# Patient Record
Sex: Male | Born: 1935 | Race: White | Hispanic: No | Marital: Married | Smoking: Former smoker
Health system: Southern US, Community
[De-identification: ages and names within clinical notes are randomized; demographics above are authoritative.]

## PROBLEM LIST (undated history)

## (undated) DIAGNOSIS — I2510 Atherosclerotic heart disease of native coronary artery without angina pectoris: Secondary | ICD-10-CM

## (undated) DIAGNOSIS — K219 Gastro-esophageal reflux disease without esophagitis: Secondary | ICD-10-CM

## (undated) DIAGNOSIS — I10 Essential (primary) hypertension: Secondary | ICD-10-CM

## (undated) DIAGNOSIS — F419 Anxiety disorder, unspecified: Secondary | ICD-10-CM

## (undated) DIAGNOSIS — I779 Disorder of arteries and arterioles, unspecified: Secondary | ICD-10-CM

## (undated) DIAGNOSIS — M1990 Unspecified osteoarthritis, unspecified site: Secondary | ICD-10-CM

## (undated) DIAGNOSIS — R0602 Shortness of breath: Secondary | ICD-10-CM

## (undated) DIAGNOSIS — E119 Type 2 diabetes mellitus without complications: Secondary | ICD-10-CM

## (undated) DIAGNOSIS — I739 Peripheral vascular disease, unspecified: Secondary | ICD-10-CM

## (undated) DIAGNOSIS — J439 Emphysema, unspecified: Secondary | ICD-10-CM

## (undated) DIAGNOSIS — E785 Hyperlipidemia, unspecified: Secondary | ICD-10-CM

## (undated) HISTORY — PX: CARDIAC CATHETERIZATION: SHX172

## (undated) HISTORY — DX: Unspecified osteoarthritis, unspecified site: M19.90

## (undated) HISTORY — DX: Hyperlipidemia, unspecified: E78.5

## (undated) HISTORY — DX: Essential (primary) hypertension: I10

## (undated) HISTORY — DX: Gastro-esophageal reflux disease without esophagitis: K21.9

## (undated) HISTORY — PX: CORONARY ANGIOPLASTY: SHX604

## (undated) HISTORY — DX: Peripheral vascular disease, unspecified: I73.9

## (undated) HISTORY — DX: Atherosclerotic heart disease of native coronary artery without angina pectoris: I25.10

## (undated) HISTORY — PX: COLONOSCOPY: SHX174

## (undated) HISTORY — DX: Disorder of arteries and arterioles, unspecified: I77.9

## (undated) HISTORY — DX: Type 2 diabetes mellitus without complications: E11.9

---

## 1997-09-18 ENCOUNTER — Emergency Department (HOSPITAL_COMMUNITY): Admission: EM | Admit: 1997-09-18 | Discharge: 1997-09-18 | Payer: Self-pay | Admitting: Emergency Medicine

## 1999-03-05 ENCOUNTER — Ambulatory Visit (HOSPITAL_COMMUNITY): Admission: RE | Admit: 1999-03-05 | Discharge: 1999-03-05 | Payer: Self-pay | Admitting: Urology

## 1999-08-14 ENCOUNTER — Ambulatory Visit (HOSPITAL_COMMUNITY): Admission: RE | Admit: 1999-08-14 | Discharge: 1999-08-14 | Payer: Self-pay | Admitting: Urology

## 1999-08-14 ENCOUNTER — Encounter: Payer: Self-pay | Admitting: Urology

## 1999-10-16 ENCOUNTER — Ambulatory Visit (HOSPITAL_COMMUNITY): Admission: RE | Admit: 1999-10-16 | Discharge: 1999-10-17 | Payer: Self-pay | Admitting: Cardiovascular Disease

## 1999-10-16 ENCOUNTER — Encounter: Payer: Self-pay | Admitting: Cardiovascular Disease

## 1999-11-21 ENCOUNTER — Ambulatory Visit (HOSPITAL_COMMUNITY): Admission: RE | Admit: 1999-11-21 | Discharge: 1999-11-22 | Payer: Self-pay | Admitting: Cardiovascular Disease

## 1999-11-21 ENCOUNTER — Encounter: Payer: Self-pay | Admitting: Cardiovascular Disease

## 2000-02-25 ENCOUNTER — Ambulatory Visit (HOSPITAL_COMMUNITY): Admission: RE | Admit: 2000-02-25 | Discharge: 2000-02-25 | Payer: Self-pay | Admitting: Family Medicine

## 2001-11-15 ENCOUNTER — Encounter: Payer: Self-pay | Admitting: Cardiovascular Disease

## 2001-11-15 ENCOUNTER — Ambulatory Visit (HOSPITAL_COMMUNITY): Admission: RE | Admit: 2001-11-15 | Discharge: 2001-11-15 | Payer: Self-pay | Admitting: Cardiovascular Disease

## 2002-01-25 ENCOUNTER — Ambulatory Visit (HOSPITAL_COMMUNITY): Admission: RE | Admit: 2002-01-25 | Discharge: 2002-01-25 | Payer: Self-pay | Admitting: Gastroenterology

## 2003-08-06 ENCOUNTER — Ambulatory Visit (HOSPITAL_COMMUNITY): Admission: RE | Admit: 2003-08-06 | Discharge: 2003-08-06 | Payer: Self-pay | Admitting: Family Medicine

## 2003-08-28 ENCOUNTER — Encounter: Admission: RE | Admit: 2003-08-28 | Discharge: 2003-09-20 | Payer: Self-pay | Admitting: Neurosurgery

## 2004-01-18 ENCOUNTER — Ambulatory Visit (HOSPITAL_COMMUNITY): Admission: RE | Admit: 2004-01-18 | Discharge: 2004-01-18 | Payer: Self-pay | Admitting: Gastroenterology

## 2004-01-18 ENCOUNTER — Encounter (INDEPENDENT_AMBULATORY_CARE_PROVIDER_SITE_OTHER): Payer: Self-pay | Admitting: *Deleted

## 2004-12-27 ENCOUNTER — Emergency Department (HOSPITAL_COMMUNITY): Admission: EM | Admit: 2004-12-27 | Discharge: 2004-12-27 | Payer: Self-pay | Admitting: Emergency Medicine

## 2005-09-29 ENCOUNTER — Ambulatory Visit (HOSPITAL_COMMUNITY): Admission: RE | Admit: 2005-09-29 | Discharge: 2005-09-29 | Payer: Self-pay | Admitting: Podiatry

## 2007-05-04 ENCOUNTER — Encounter: Admission: RE | Admit: 2007-05-04 | Discharge: 2007-05-04 | Payer: Self-pay | Admitting: Family Medicine

## 2007-06-06 ENCOUNTER — Emergency Department (HOSPITAL_COMMUNITY): Admission: EM | Admit: 2007-06-06 | Discharge: 2007-06-06 | Payer: Self-pay | Admitting: Emergency Medicine

## 2008-12-17 ENCOUNTER — Inpatient Hospital Stay (HOSPITAL_COMMUNITY): Admission: EM | Admit: 2008-12-17 | Discharge: 2008-12-19 | Payer: Self-pay | Admitting: Emergency Medicine

## 2009-01-16 ENCOUNTER — Encounter: Admission: RE | Admit: 2009-01-16 | Discharge: 2009-01-16 | Payer: Self-pay | Admitting: Gastroenterology

## 2009-07-01 ENCOUNTER — Encounter: Admission: RE | Admit: 2009-07-01 | Discharge: 2009-07-01 | Payer: Self-pay | Admitting: Family Medicine

## 2009-07-20 ENCOUNTER — Encounter: Admission: RE | Admit: 2009-07-20 | Discharge: 2009-07-20 | Payer: Self-pay | Admitting: Neurology

## 2009-07-23 ENCOUNTER — Encounter: Admission: RE | Admit: 2009-07-23 | Discharge: 2009-10-21 | Payer: Self-pay | Admitting: Neurology

## 2010-08-23 LAB — DIFFERENTIAL
Basophils Absolute: 0.1 10*3/uL (ref 0.0–0.1)
Lymphocytes Relative: 36 % (ref 12–46)
Lymphs Abs: 2.1 10*3/uL (ref 0.7–4.0)
Monocytes Absolute: 0.7 10*3/uL (ref 0.1–1.0)

## 2010-08-23 LAB — LIPID PANEL
Cholesterol: 131 mg/dL (ref 0–200)
HDL: 40 mg/dL (ref >39)
LDL Cholesterol: 68 mg/dL (ref 0–99)
Total CHOL/HDL Ratio: 3.3 RATIO

## 2010-08-23 LAB — COMPREHENSIVE METABOLIC PANEL
ALT: 19 U/L (ref 0–53)
AST: 21 U/L (ref 0–37)
Albumin: 3.7 g/dL (ref 3.5–5.2)
Alkaline Phosphatase: 76 U/L (ref 39–117)
CO2: 24 mEq/L (ref 19–32)
GFR calc Af Amer: >60 mL/min (ref >60)
Glucose, Bld: 127 mg/dL — ABNORMAL HIGH (ref 70–99)
Total Bilirubin: 0.9 mg/dL (ref 0.3–1.2)
Total Protein: 6.2 g/dL (ref 6.0–8.3)

## 2010-08-23 LAB — BASIC METABOLIC PANEL
CO2: 25 mEq/L (ref 19–32)
Calcium: 9.2 mg/dL (ref 8.4–10.5)
Chloride: 111 mEq/L (ref 96–112)
GFR calc Af Amer: >60 mL/min (ref >60)
GFR calc non Af Amer: 51 mL/min — ABNORMAL LOW (ref >60)
GFR calc non Af Amer: 55 mL/min — ABNORMAL LOW (ref >60)
Glucose, Bld: 146 mg/dL — ABNORMAL HIGH (ref 70–99)
Potassium: 4.6 mEq/L (ref 3.5–5.1)
Sodium: 142 mEq/L (ref 135–145)

## 2010-08-23 LAB — CARDIAC PANEL(CRET KIN+CKTOT+MB+TROPI)
CK, MB: 3 ng/mL (ref 0.3–4.0)
CK, MB: 3.1 ng/mL (ref 0.3–4.0)
Relative Index: 3.1 — ABNORMAL HIGH (ref 0.0–2.5)
Relative Index: INVALID (ref 0.0–2.5)
Relative Index: INVALID (ref 0.0–2.5)
Total CK: 100 U/L (ref 7–232)
Total CK: 84 U/L (ref 7–232)

## 2010-08-23 LAB — HEPARIN LEVEL (UNFRACTIONATED): Heparin Unfractionated: 1.26 IU/mL — ABNORMAL HIGH (ref 0.30–0.70)

## 2010-08-23 LAB — HEMOGLOBIN A1C: Mean Plasma Glucose: 169 mg/dL

## 2010-08-23 LAB — POCT CARDIAC MARKERS
CKMB, poc: <1 ng/mL — ABNORMAL LOW (ref 1.0–8.0)
Myoglobin, poc: 51.4 ng/mL (ref 12–200)
Myoglobin, poc: 58.8 ng/mL (ref 12–200)
Troponin i, poc: <0.05 ng/mL (ref 0.00–0.09)

## 2010-08-23 LAB — CBC
HCT: 38.6 % — ABNORMAL LOW (ref 39.0–52.0)
HCT: 39.3 % (ref 39.0–52.0)
HCT: 40.1 % (ref 39.0–52.0)
Hemoglobin: 13.3 g/dL (ref 13.0–17.0)
MCV: 94.4 fL (ref 78.0–100.0)
Platelets: 122 10*3/uL — ABNORMAL LOW (ref 150–400)
Platelets: 141 10*3/uL — ABNORMAL LOW (ref 150–400)
RBC: 4.09 MIL/uL — ABNORMAL LOW (ref 4.22–5.81)
RBC: 4.11 MIL/uL — ABNORMAL LOW (ref 4.22–5.81)
RBC: 4.21 MIL/uL — ABNORMAL LOW (ref 4.22–5.81)
RDW: 13.4 % (ref 11.5–15.5)
RDW: 13.7 % (ref 11.5–15.5)
WBC: 6.9 10*3/uL (ref 4.0–10.5)

## 2010-08-23 LAB — URINE MICROSCOPIC-ADD ON

## 2010-08-23 LAB — URINALYSIS, ROUTINE W REFLEX MICROSCOPIC
Glucose, UA: NEGATIVE mg/dL
Hgb urine dipstick: NEGATIVE
Ketones, ur: NEGATIVE mg/dL
Protein, ur: NEGATIVE mg/dL
Specific Gravity, Urine: 1.005 (ref 1.005–1.030)
Urobilinogen, UA: 0.2 mg/dL (ref 0.0–1.0)

## 2010-08-23 LAB — URINE CULTURE: Culture: NO GROWTH

## 2010-09-30 NOTE — H&P (Signed)
NAMEJEANPIERRE, THEBEAU NO.:  [id number]   MEDICAL RECORD NO.:  [id number]          PATIENT TYPE:  EMS   LOCATION:  MAJO                         FACILITY:  [facility]   PHYSICIAN:  Nanetta Batty, M.D.   DATE OF BIRTH:  Feb 02, 1933   DATE OF ADMISSION:  12/17/2008  DATE OF DISCHARGE:                              HISTORY & PHYSICAL   CHIEF COMPLAINTS:  Chest pain and epigastric pain.   HISTORY OF PRESENT ILLNESS:  Mr. Marple is a 77 year old male who has been  followed by Dr. Allyson Sabal in the past.  He currently has no primary care  doctor.  He lives in [place].  He last saw Dr. Allyson Sabal about a year  ago.  Apparently he has had a Myoview about 2 years ago that was okay  according to the daughter who gives most of the history.  The patient is  in his usual state of health which has been gradually declining.  He  says he is constantly weak and has some dyspnea.  Last night he had some  epigastric burning.  He did not take anything for it.  This morning he  apparently had more epigastric burning and then had some left arm  numbness down to his elbow.  He was mildly nauseated with this.  He did  not break out in a sweat.  It lasted about 15 minutes.  The patient's  daughter called our office, and he was instructed to come to the  emergency room.  Currently he is painfree.  He does have a history of  coronary disease.  He had a three-vessel intervention in 2001 in a  staged procedure.  In May 2001 he underwent circumflex and LAD stenting  after an abnormal Myoview.  He was brought back in July 2001 for a RCA  stent.  In July 2003 he had another abnormal Myoview and was restudied,  and this showed patent stents.  His last ejection fraction was 40-50% at  catheterization.  His office records are pending.  He is admitted now  from the emergency room for further evaluation for unstable angina.   PAST MEDICAL HISTORY:  Remarkable for:  1. Gastritis by endoscopy in 2005.  2. He has  had some problems with dizziness and in 2008 was worked up      at [facility] with an MRA.  This showed some vertebrobasilar artery      stenosis.  3. He also has had some weakness in his legs.  ABIs done in May 2007      at [facility] were 0.93 and 0.98.  4. He has had no other major surgeries.  5. He has treated hypertension and dyslipidemia.   CURRENT MEDICATIONS:  1. Aspirin 81 mg a day.  2. Plavix 75 mg a day.  3. Paxil 20 mg a day.  4. Cardura 8 mg q.h.s.  5. Prilosec 40 mg a day.  6. Toprol-XL 50 mg a day.  7. Imdur 30 mg a day.  8. Lyrica 75 mg t.i.d.  9. Niaspan 750 mg q.h.s.  10.Xanax 0.5  mg q.8 p.r.n.  11.Vytorin 10/40 daily.  12.Relafen 750 mg b.i.d.   ALLERGIES:  He has no known drug allergies.   SOCIAL HISTORY:  He is married.  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  He is a remote smoker.  He has 4  children and 8 grandchildren.  He lives at home with his wife.   FAMILY HISTORY:  Remarkable in that 2 of his brothers have had coronary  disease at an early age.   REVIEW OF SYSTEMS:  He has had a history of kidney stones in the past.  He has had BPH and has had a TUR.  He saw Dr. Patsi Sears years ago for  this, but he is not currently having symptoms.  He does have a hiatal  hernia and some gastritis and takes a PPI for this.  He has not had  melena.  There is no history of thyroid disease or diabetes.  Overall he  says he has been weak and short of breath for some time now.   PHYSICAL EXAMINATION:  Blood pressure is 146/75, pulse 62, temperature  97.4, respirations 20.  GENERAL:  He is an overweight male in no acute distress.  HEENT:  Normocephalic.  Extraocular movements are intact.  Sclerae  nonicteric.  Lids and conjunctivae within normal limits.  He wears  glasses.  NECK:  Without JVD or bruit.  CHEST:  Reveals clear lung fields bilaterally.  CARDIAC:  Reveals regular rate and rhythm without obvious murmur, rub or  gallop.   Normal S1 and S2.  ABDOMEN: Obese.  He has mild midepigastric tenderness.  Bowel sounds are  present.  EXTREMITIES:  Reveal no edema.  Distal pulses are diminished.  There are  no femoral bruits noted.  NEURO:  Exam is grossly intact.  He is awake, alert and oriented,  cooperative.  Moves all extremities without obvious deficit.  SKIN:  Cool and dry.   LABORATORY DATA:  Hematology profile is pending.  His first troponin is  negative.  Chest x-ray shows no active disease.  Urinalysis is  unremarkable and hematology is within normal limits.   IMPRESSION:  1. Unstable angina.  2. Coronary disease with staged three-vessel intervention in May 2001      and July 2001.  These sites were patent at catheterization in July      2003.  3. Left ventricular dysfunction with an ejection fraction of 40-50% in      2003 at catheterization.  4. Treated hypertension.  5. Treated dyslipidemia.  6. Benign prostatic hypertrophy.  7. Remote kidney stones.   PLAN:  The patient will be admitted to telemetry to rule out MI.  We  will go ahead and start heparin and IV nitrates.     Abelino Derrick, P.A.      Nanetta Batty, M.D.  Electronically Signed   LKK/MEDQ  D:  12/17/2008  T:  12/17/2008  Job:  [id number]

## 2010-09-30 NOTE — Discharge Summary (Signed)
Marvin Leon, Marvin Leon                 ACCOUNT NO.:  [id number]   MEDICAL RECORD NO.:  [id number]          PATIENT TYPE:  INP   LOCATION:  4735                         FACILITY:  [facility]   PHYSICIAN:  Nanetta Batty, M.D.   DATE OF BIRTH:  09-17-1932   DATE OF ADMISSION:  12/17/2008  DATE OF DISCHARGE:  12/19/2008                               DISCHARGE SUMMARY   DISCHARGE DIAGNOSES:  1. Chest pain most likely gastrointestinal, needs to be on other      medication, omeprazole.      a.     Follow up with Dr. Loreta Ave.  2. Anxiety.  3. Coronary artery disease with percutaneous interventions in the      past, currently patent stents.  4. Peripheral vascular disease.  5. Normal left ventricular function.  6. Benign prostatic hyperplasia.  7. Remote kidneys stones.  8. Dyslipidemia.   DISCHARGE CONDITION:  Improved.   PROCEDURES:  On December 18, 2008, combined left heart catheterization by  Dr. Nanetta Batty revealing patent stents, normal LV function at 60%,  right lower extremity stenosis.   DISCHARGE MEDICATIONS:  1. Instead of omeprazole, use Zegerid one daily, he may need about      this over-the-counter.  2. Lyrica 75 mg three times a day.  3. Vytorin 10/40 daily.  4. Alprazolam 1 mg one-half to one tablet twice a day as needed.  5. Niaspan 750 mg two tablets at bedtime.  6. Isosorbide mononitrate 30 mg half a tablet daily.  7. Baby aspirin 81 mg daily.  8. Paroxetine hydrochloride 20 mg daily.  9. Metoprolol succinate ER 50 mg daily.  10.Nabumetone 750 mg one before breakfast and supper.  11.Doxazosin mesylate 8 mg daily.  12.Glucosamine sulfate two capsules twice a day.  13.Plavix 75 mg daily.  14.Extra Strength Tylenol 500 mg as needed.  15.Spiriva inhaler one puff 18 mcg daily.  16.Nitroglycerin sublingual one under tongue as needed for chest pain      up to three over 15 minutes.   DISCHARGE INSTRUCTIONS:  1. Increase activity slowly.  May shower.  No lifting for 1 week.   No      driving for 2 days.  2. Low-sodium heart-healthy diet.  3. Wash cath site with soap and water.  Call us if any bleeding,      swelling or drainage.  4. Follow up with Dr. Allyson Sabal on January 03, 2009 at 2:30 p.m.  5. Follow up with Dr. Loreta Ave.  He had an appointment for January 08, 2009      at 1:30 p.m.   HOSPITAL COURSE:  Mr. Debes was admitted secondary due to chest pain and  epigastric pain.  A night prior to admission, he had epigastric burning,  did not take anything for it.  The morning of admission of December 17, 2008, he had more epigastric burning and left arm numbness down to his  elbow.  He was mildly nauseated, though he was not diaphoretic, episode  lasted 50 minutes.  The patient's daughter called our office.  He was  instructed to come to the  emergency room.  In the ER, he was painfree.  He does have a history of coronary artery disease with three-vessel  intervention in 2001, end-stage procedures.  In May 2001, he underwent  circumflex and LAD stenting and after abnormal Myoview.  He was then  brought back in July 2001 for RCA stent.  Stents were patent in July  2003.  EF previously had been 40-50%.   Other history includes gastritis, problems with dizziness and workup at  [facility] with MRA with some vertebrobasilar artery stenosis.  ABIs  have been done on bilateral lower extremities with 0.93 and 0.98 with  also treated hypertension and dyslipidemia.   The patient is also anxious and he takes Xanax every 8 hours p.r.n.  though it is usually more technically 3 times a day and if he does not  have it, he does seem to be easily agitated.   The patient was admitted to the hospital, placed on IV heparin,  nitroglycerin and on December 18, 2008, underwent cardiac catheterization,  which fortunately revealed patent stents.  It was felt to be more GI  issue recently.  He was unable to get a Zegerid for his GI gastritis and  was taking omeprazole, which has not controlled  his indigestion.   Therefore, we have asked him to see Dr. Loreta Ave back in consult.  In the  ER, he had an appointment and to buy the Zegerid over-the-counter if he  has to until we can see her.   LABORATORY DATA:  Hemoglobin at discharge 13.3, hematocrit 39.3, WBC  5.9, platelets have been 141 on admission and at discharge 96,000,  neutrophils 45, lymph 36, mono 11, eos 7, baso 1.  Protime 13, INR of 1,  PTT 25, heparin 1.26.   Sodium 140, potassium 4.2, chloride 108, CO2 24, glucose 127, BUN 11,  creatinine 1.38.   These remained stable.  Total protein 6.2, albumin 3.7, AST 21, ALT 19,  alkaline phos 76, total bili 0.9, magnesium 2.   Glycohemoglobin 7.5.   Cardiac enzymes were negative ranging 84-100 with MBs 2.5-3.1 and  troponin I of 0.01-0.02.   Total cholesterol 131, triglycerides 114, HDL 40, LDL 68.  TSH 1.040.  UA had trace leukocyte esterase, but culture was negative.  No growth.  EKG, sinus rhythm without acute changes and chest x-ray on admission  with no active disease.      Darcella Gasman. Annie Paras, N.P.      Nanetta Batty, M.D.  Electronically Signed    LRI/MEDQ  D:  01/16/2009  T:  01/17/2009  Job:  [id number]   cc:   Dr. Minette Headland

## 2010-09-30 NOTE — Cardiovascular Report (Signed)
NAMECHRSTOPHER, MALENFANT NO.:  [id number]   MEDICAL RECORD NO.:  [id number]          PATIENT TYPE:  INP   LOCATION:  4735                         FACILITY:  [facility]   PHYSICIAN:  Nanetta Batty, M.D.   DATE OF BIRTH:  [birth year]   DATE OF PROCEDURE:  DATE OF DISCHARGE:                            CARDIAC CATHETERIZATION   DIAGNOSTIC CORONARY ARTERIOGRAM   Marvin Leon is a 77 year old white male with a history of CAD status post  three-vessel PCI and stenting approximately 10 years ago.  He has a  history of gastritis, hypertension, dyslipidemia, and BPH.  He was  admitted by Dr. Daphene Jaeger on December 17, 2098, with unstable angina.  Ruled out for myocardial infarction.  He presents now for diagnostic  coronary arteriography to define his anatomy and rule out ischemic  etiology.   PROCEDURE DESCRIPTION:  The patient was brought to the second floor of  [facility] in the postabsorptive state.  Premedicated  with p.o. Valium.  His right groin was prepped and shaved in the usual  sterile fashion.  A 1% Xylocaine was used for local anesthesia.  A 5-  French sheath was inserted in to the right femoral artery using the  standard Seldinger technique.  A 5-French right and left Judkins  diagnostic catheters, as well as 5-French pigtail catheter, were used  for selective coronary angiography, left ventriculography, and distal  abdominal aortography.  Visipaque dye was used for the entirety of the  case.  Retrograde aortic and left ventricular blood pressures were  recorded.   HEMODYNAMICS:  Aortic systolic pressure 182, diastolic pressure 86.  Left ventricle systolic pressure 180, end-diastolic pressure 23.   Selective coronary angiography:  1. Left main normal.  2. LAD; LAD had 20-30% in-stent restenosis within the distal portion      of the proximal LAD stent.  There was a small diagonal branch,      which arose from within the stented segment and had a  high-grade      ostial stenosis.  3. Left circumflex; proximal circumflex stent was widely patent.  4. Right coronary artery; dominant with a widely patent proximal RCA      stent with 20-30% diffuse smooth in-stent restenosis.   Left ventriculography; RAO left ventriculogram was performed using 25 mL  of Visipaque dye at 12 mL per second.  The overall LVEF was estimated at  greater than 60% without focal wall motion abnormalities.   Distal abdominal aortography; distal abdominal aortogram was performed  using 20 mL of Visipaque dye at 20 mL per second x2.  The renal arteries  were widely patent.  Infrarenal abdominal aorta was free of significant  atherosclerotic changes.  There was a 50-60% ostial right common iliac  artery stenosis, but the patient denies claudication.   IMPRESSION:  Marvin Leon has patent stents.  He does not have a culprit  lesion.  Empiric antireflux therapy will be recommended.  An ACT was  measured at greater than 200.  Sheath was then secured in place.  The  patient left the lab in  stable condition.  Sheath will be removed once  the ACT falls below 200.  The patient will be gently hydrated and  discharged home in the morning.  He left the lab in stable condition.      Nanetta Batty, M.D.  Electronically Signed     JB/MEDQ  D:  12/18/2008  T:  12/19/2008  Job:  [id number]   cc:   Second Floor [facility]  [facility]  Dr. Minette Headland

## 2010-10-03 NOTE — Op Note (Signed)
NAMEGARNER, Marvin Leon                             ACCOUNT NO.:  [id number]   MEDICAL RECORD NO.:  [id number]                   PATIENT TYPE:  AMB   LOCATION:  ENDO                                 FACILITY:  [facility]   PHYSICIAN:  Anselmo Rod, M.D.               DATE OF BIRTH:  [birth year]   DATE OF PROCEDURE:  01/18/2004  DATE OF DISCHARGE:  01/18/2004                                 OPERATIVE REPORT   PROCEDURE PERFORMED:  Esophagogastroduodenoscopy with biopsies.   ENDOSCOPIST:  Charna Elizabeth, M.D.   INSTRUMENT USED:  Olympus video panendoscope.   INDICATIONS FOR PROCEDURE:  The patient is a 77 year old white male with a  history of epigastric discomfort and mild anemia, rule out peptic ulcer  disease, esophagitis, gastritis, etc.   PREPROCEDURE PRPARATION:  Informed consent was procured from the patient.  The patient was fasted for eight hours prior to the procedure.  The patient  was also asked to discontinue Plavix five days before the procedure after  discussion with Nanetta Batty, M.D.   PREPROCEDURE PHYSICAL:  The patient had stable vital signs.  Neck supple,  chest clear to auscultation.  Abdomen soft with normal bowel sounds.   DESCRIPTION OF PROCEDURE:  The patient was placed in the left lateral  decubitus position and sedated with 60 mg of Demerol and 6 mg of Versed in  slow incremental doses.  Once the patient was adequately sedated and  maintained on low-flow oxygen and continuous cardiac monitoring, the Olympus  video panendoscope was advanced through the mouth piece over the tongue into  the esophagus under direct vision.  The entire esophagus appeared normal  with no evidence of ring, stricture, masses, esophagitis or Barrett's  mucosa.  The scope was then advanced to the stomach.  Antral biopsies were  done to rule out Helicobacter pylori.  Diffuse gastritis was noted.  The  patient had some prominent gastric folds in the proximal stomach and these  were biopsied  as well.  Small bowel biopsies were done to rule out sprue.  No definite ulcers or masses were seen.  The patient tolerated the procedure  well without immediate complications.   IMPRESSION:  1.  Normal-appearing esophagus.  2.  Prominent gastric folds, biopsies done rule out to rule out Helicobacter      pylori.  3.  Diffuse gastritis.  4.  Normal proximal small bowel biopsies done to rule out sprue.   RECOMMENDATIONS:  1.  Rule out pathology results.  2.  Avoid all nonsteroidals for now.  3.  Proceed with colonoscopy at this time.  Further recommendations will be      made thereafter.  Anselmo Rod, M.D.    JNM/MEDQ  D:  01/18/2004  T:  01/21/2004  Job:  [id number]   cc:   Teena Irani. Arlyce Dice, M.D.  [address]  [place]  [place] [address]  Fax: [phone]   Nanetta Batty, M.D.  Fax: [phone]

## 2011-03-11 ENCOUNTER — Inpatient Hospital Stay (HOSPITAL_COMMUNITY)
Admission: EM | Admit: 2011-03-11 | Discharge: 2011-03-14 | DRG: 392 | Disposition: A | Discharge status: Home / Self Care | Payer: PRIVATE HEALTH INSURANCE | Source: Ambulatory Visit | Attending: Internal Medicine | Admitting: Internal Medicine

## 2011-03-11 ENCOUNTER — Emergency Department (HOSPITAL_COMMUNITY): Payer: PRIVATE HEALTH INSURANCE

## 2011-03-11 DIAGNOSIS — K3189 Other diseases of stomach and duodenum: Secondary | ICD-10-CM | POA: Diagnosis present

## 2011-03-11 DIAGNOSIS — Z888 Allergy status to other drugs, medicaments and biological substances status: Secondary | ICD-10-CM

## 2011-03-11 DIAGNOSIS — E119 Type 2 diabetes mellitus without complications: Secondary | ICD-10-CM | POA: Diagnosis present

## 2011-03-11 DIAGNOSIS — F411 Generalized anxiety disorder: Secondary | ICD-10-CM | POA: Diagnosis present

## 2011-03-11 DIAGNOSIS — Z79899 Other long term (current) drug therapy: Secondary | ICD-10-CM

## 2011-03-11 DIAGNOSIS — Z87891 Personal history of nicotine dependence: Secondary | ICD-10-CM

## 2011-03-11 DIAGNOSIS — E875 Hyperkalemia: Secondary | ICD-10-CM | POA: Diagnosis present

## 2011-03-11 DIAGNOSIS — I2510 Atherosclerotic heart disease of native coronary artery without angina pectoris: Secondary | ICD-10-CM | POA: Diagnosis present

## 2011-03-11 DIAGNOSIS — E785 Hyperlipidemia, unspecified: Secondary | ICD-10-CM | POA: Diagnosis present

## 2011-03-11 DIAGNOSIS — N400 Enlarged prostate without lower urinary tract symptoms: Secondary | ICD-10-CM | POA: Diagnosis present

## 2011-03-11 DIAGNOSIS — Z9861 Coronary angioplasty status: Secondary | ICD-10-CM

## 2011-03-11 DIAGNOSIS — R1013 Epigastric pain: Principal | ICD-10-CM | POA: Diagnosis present

## 2011-03-11 DIAGNOSIS — R82998 Other abnormal findings in urine: Secondary | ICD-10-CM | POA: Diagnosis present

## 2011-03-11 DIAGNOSIS — K589 Irritable bowel syndrome without diarrhea: Secondary | ICD-10-CM | POA: Diagnosis present

## 2011-03-11 DIAGNOSIS — F329 Major depressive disorder, single episode, unspecified: Secondary | ICD-10-CM | POA: Diagnosis present

## 2011-03-11 DIAGNOSIS — M1990 Unspecified osteoarthritis, unspecified site: Secondary | ICD-10-CM | POA: Diagnosis present

## 2011-03-11 DIAGNOSIS — Z7982 Long term (current) use of aspirin: Secondary | ICD-10-CM

## 2011-03-11 DIAGNOSIS — G4700 Insomnia, unspecified: Secondary | ICD-10-CM | POA: Diagnosis present

## 2011-03-11 DIAGNOSIS — E860 Dehydration: Secondary | ICD-10-CM | POA: Diagnosis present

## 2011-03-11 DIAGNOSIS — N289 Disorder of kidney and ureter, unspecified: Secondary | ICD-10-CM | POA: Diagnosis present

## 2011-03-11 DIAGNOSIS — I10 Essential (primary) hypertension: Secondary | ICD-10-CM | POA: Diagnosis present

## 2011-03-11 DIAGNOSIS — Z880 Allergy status to penicillin: Secondary | ICD-10-CM

## 2011-03-11 DIAGNOSIS — F3289 Other specified depressive episodes: Secondary | ICD-10-CM | POA: Diagnosis present

## 2011-03-11 DIAGNOSIS — K219 Gastro-esophageal reflux disease without esophagitis: Secondary | ICD-10-CM | POA: Diagnosis present

## 2011-03-11 DIAGNOSIS — N281 Cyst of kidney, acquired: Secondary | ICD-10-CM | POA: Diagnosis present

## 2011-03-11 DIAGNOSIS — Z7902 Long term (current) use of antithrombotics/antiplatelets: Secondary | ICD-10-CM

## 2011-03-11 LAB — URINE MICROSCOPIC-ADD ON

## 2011-03-11 LAB — COMPREHENSIVE METABOLIC PANEL
ALT: 22 U/L (ref 0–53)
AST: 21 U/L (ref 0–37)
Alkaline Phosphatase: 69 U/L (ref 39–117)
CO2: 23 mEq/L (ref 19–32)
Calcium: 10.6 mg/dL — ABNORMAL HIGH (ref 8.4–10.5)
Chloride: 105 mEq/L (ref 96–112)
GFR calc non Af Amer: 60 mL/min — ABNORMAL LOW (ref >90)
Potassium: 3.9 mEq/L (ref 3.5–5.1)
Sodium: 138 mEq/L (ref 135–145)

## 2011-03-11 LAB — DIFFERENTIAL
Basophils Absolute: 0 10*3/uL (ref 0.0–0.1)
Basophils Relative: 0 % (ref 0–1)
Eosinophils Absolute: 0.5 10*3/uL (ref 0.0–0.7)
Neutro Abs: 4.7 10*3/uL (ref 1.7–7.7)
Neutrophils Relative %: 59 % (ref 43–77)

## 2011-03-11 LAB — URINALYSIS, ROUTINE W REFLEX MICROSCOPIC
Bilirubin Urine: NEGATIVE
Glucose, UA: NEGATIVE mg/dL
Hgb urine dipstick: NEGATIVE
Specific Gravity, Urine: 1.004 — ABNORMAL LOW (ref 1.005–1.030)

## 2011-03-11 LAB — CBC
Hemoglobin: 13.6 g/dL (ref 13.0–17.0)
MCH: 31.1 pg (ref 26.0–34.0)
Platelets: 136 10*3/uL — ABNORMAL LOW (ref 150–400)
RBC: 4.37 MIL/uL (ref 4.22–5.81)
WBC: 8.1 10*3/uL (ref 4.0–10.5)

## 2011-03-11 MED ORDER — IOHEXOL 300 MG/ML  SOLN: 80.0000 mL | Freq: Once | INTRAMUSCULAR | Status: AC | PRN

## 2011-03-12 LAB — CBC
MCH: 30.4 pg (ref 26.0–34.0)
MCHC: 32.7 g/dL (ref 30.0–36.0)
MCV: 92.8 fL (ref 78.0–100.0)
Platelets: 131 10*3/uL — ABNORMAL LOW (ref 150–400)
RBC: 4.28 MIL/uL (ref 4.22–5.81)

## 2011-03-12 LAB — BASIC METABOLIC PANEL
BUN: 18 mg/dL (ref 6–23)
BUN: 16 mg/dL (ref 6–23)
Calcium: 9.4 mg/dL (ref 8.4–10.5)
Chloride: 109 mEq/L (ref 96–112)
Creatinine, Ser: 1.37 mg/dL — ABNORMAL HIGH (ref 0.50–1.35)
GFR calc Af Amer: 56 mL/min — ABNORMAL LOW (ref >90)
GFR calc non Af Amer: 49 mL/min — ABNORMAL LOW (ref >90)
Glucose, Bld: 91 mg/dL (ref 70–99)
Potassium: 5.6 mEq/L — ABNORMAL HIGH (ref 3.5–5.1)

## 2011-03-12 LAB — HEMOGLOBIN A1C
Hgb A1c MFr Bld: 6.9 % — ABNORMAL HIGH (ref <5.7)
Mean Plasma Glucose: 151 mg/dL — ABNORMAL HIGH (ref <117)

## 2011-03-12 LAB — GLUCOSE, CAPILLARY
Glucose-Capillary: 141 mg/dL — ABNORMAL HIGH (ref 70–99)
Glucose-Capillary: 122 mg/dL — ABNORMAL HIGH (ref 70–99)

## 2011-03-13 ENCOUNTER — Inpatient Hospital Stay (HOSPITAL_COMMUNITY): Payer: PRIVATE HEALTH INSURANCE

## 2011-03-13 LAB — BASIC METABOLIC PANEL
BUN: 17 mg/dL (ref 6–23)
Chloride: 109 mEq/L (ref 96–112)
GFR calc Af Amer: 56 mL/min — ABNORMAL LOW (ref >90)
Glucose, Bld: 139 mg/dL — ABNORMAL HIGH (ref 70–99)
Potassium: 4.3 mEq/L (ref 3.5–5.1)

## 2011-03-13 LAB — CBC
HCT: 38.3 % — ABNORMAL LOW (ref 39.0–52.0)
Hemoglobin: 12.6 g/dL — ABNORMAL LOW (ref 13.0–17.0)
RDW: 13.9 % (ref 11.5–15.5)
WBC: 7.2 10*3/uL (ref 4.0–10.5)

## 2011-03-13 LAB — GLUCOSE, CAPILLARY
Glucose-Capillary: 154 mg/dL — ABNORMAL HIGH (ref 70–99)
Glucose-Capillary: 88 mg/dL (ref 70–99)

## 2011-03-14 LAB — GLUCOSE, CAPILLARY: Glucose-Capillary: 138 mg/dL — ABNORMAL HIGH (ref 70–99)

## 2011-03-17 LAB — H. PYLORI ANTIBODY, IGG: H Pylori IgG: <0.4 {ISR}

## 2011-03-18 NOTE — H&P (Signed)
Marvin Leon, Marvin NO.:  Leon  MEDICAL RECORD NO.:  [id number]  LOCATION:  [facility]                         FACILITY:  [facility]  PHYSICIAN:  Lonia Blood, M.D.DATE OF BIRTH:  February 25, 1933  DATE OF ADMISSION:  03/11/2011 DATE OF DISCHARGE:                             HISTORY & PHYSICAL   PRIMARY CARE PHYSICIAN:  Dr. Creta Levin at [facility] [place].  GASTROINTESTINAL PHYSICIAN:  Anselmo Rod, MD, [facility]  CHIEF COMPLAINT:  Abdominal and epigastric pain.  HISTORY OF PRESENT ILLNESS:  [name] is a very pleasant 77 year old gentleman.  He has a remote history of gastritis, diagnosed via endoscopy in 2005, per Dr. Charna Elizabeth Leon stain performed at that time was unremarkable for Helicobacter pylori.  The patient reports that he has done well from a GI standpoint until approximately 2-3 months ago.  He reports that for the past few months, he has had intermittent epigastric discomfort, but not particularly bothersome. Approximately 2 weeks ago, he began to experience epigastric and right upper quadrant abdominal pain that was more persistent and nagging.  The pain was consistent.  He presented to his primary care physician's office approximately 10 days ago with complaints of this escalating epigastric and right upper quadrant.  No any abdominal pain.  The patient was informed to stop his Mobic and was changed from omeprazole to Nexium.  Unfortunately, this has not served to improve his symptoms to a significant extent.  As a matter of fact, he feels that his pain is worse.  Today, his pain was so severe that he felt that he should seek emergent help.  He presented to the emergency room.  He describes the pain as sharp and cramping, located both in the epigastrium and in the right upper quadrant.  It does not appear to be associated with eating. Eating does not improve at the symptoms either.  Nothing seems to improve his symptoms  as a matter of fact.  The pain has come to the point that is essentially constant at this time.  There has been no hematemesis.  There has been significant nausea associated with it, but no vomiting.  There has been no melena or hematochezia.  In fact, he continues to have regular bowel movements with no diarrhea or severe constipation.  There is no chest pain, shortness of breath, fevers, or chills.  There have been no new medication changes apart from those discussed above.  At the time of my evaluation, the patient does not appear to be in acute distress, but does report ongoing abdominal discomfort.  REVIEW OF SYSTEMS:  As comprehensive review of systems is unrevealing with exception to the elements noted in the history of present illness above.  PAST MEDICAL HISTORY: 1. Coronary artery disease status post PTCA with placement of multiple     stents.     a.     Followed by Dr. Nanetta Batty     b.     Patent stents via cardiac cath September 2010.     c.     No more ejection fraction via cardiac cath September 2010.  d.     Right lower extremity  peripheral vascular disease-further      details unavailable at present.     e.     Benign prostatic hypertrophy.     f.     Remote history of nephrolithiasis.     g.     Hyperlipidemia.     h.     History of gastritis, diagnosed via endoscopy in 2005.     i.     Diabetes mellitus.  MEDICATIONS:  Complete list of the patient's medications is reviewed as per the patient's medication bag provided by the patient's family.  ALLERGIES:  No known drug allergies.  FAMILY HISTORY:  Noncontributory.  SOCIAL HISTORY:  The patient is married.  He lives in the [place], [place] area.  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  He has a remote history of tobacco abuse.  He does not drink alcohol.  LABORATORY DATA:  Urinalysis reveals trace of leukocyte esterase  and 3-6 white blood cells.  Hemoglobin is normal at 13.6 with MCV of 90.  White count and platelet count are normal.  Sodium, potassium, chloride, bicarb, BUN and creatinine are normal with exception of mildly elevated BUN and creatinine ratio of 20 to 1.15 respectively.  Calcium is elevated at 10.6.  Serum glucose is mildly elevated at 113.  LFTs are unrevealing.  Albumin is normal at 4.2.  CT scan of the abdomen and pelvis was accomplished by the ER and reveals no acute disease.  PHYSICAL EXAMINATION:  VITAL SIGNS:  Temperature 97.7, blood pressure 128/64, heart rate 56, respiratory rate 12, O2 saturation 96% on room air. GENERAL:  Well-developed, well-nourished, overweight gentleman, in no acute respiratory distress. HEENT:  Normocephalic and atraumatic.  Pupils equal, round, and reactive to light and accommodation.  Extraocular muscles intact bilaterally. OC/OP clear. NECK:  No JVD. LUNGS:  Clear to auscultation bilaterally without wheezes or rhonchi. CARDIOVASCULAR:  Regular rate and rhythm without murmur, gallop, or rub. Normal S1 and S2. ABDOMEN:  The patient's abdomen is protuberant/overweight, bowel soundsare present.  The patient is tender to palpation in the epigastric and right upper quadrant.  There is no Murphy sign.  However, there is no rebound.  There is no appreciable mass.  The abdomen is not acute. EXTREMITIES:  No significant cyanosis, clubbing, or edema in bilateral extremities. NEUROLOGIC:  The patient is alert and oriented x4.  Cranial nerves II through XII were intact bilaterally.  He displays 5/5 strength in bilateral upper and lower extremities.  Since then, he has intact sensation to touch throughout.  IMPRESSION AND PLAN: 1. Abdominal pain - the etiology of the patient's abdominal pain is     not entirely clear.  His symptoms however do sound concerning for     gastritis.  I agree with discontinuing his Mobic as was done     approximately 1 week ago by  his primary care physician.  His     symptoms persist and worried about the possibility of peptic ulcer     disease.  I will dose the patient with Protonix IV.  I will limit     his intake to clear liquids only.  We will contact his GI physician     as I feel that the EGD will likely be indicated during his hospital     stay.  A CT scan of the abdomen and pelvis has ruled out other     acute abnormalities, such  as pancreatitis, pancreatic mass, or     acute cholecystitis.  There is also no evidence of nephrolithiasis. 2. Diabetes - we will hold the patient's oral medication during his     hospital stay and with his recent contrast with radiographic     procedure.  We will administer sliding scale insulin.  We will     follow his CBGs with adjustment of his medication as indicated. 3. History of coronary artery disease - at the present time, the     patient is asymptomatic appeared.  He had a cardiac cath in     September 2010.  As discussed above, they revealed patent stents.     I will continue his aspirin and Plavix. 4. Hyperlipidemia - we will continue the patient's Niaspan and his     Vytorin. 5. DVT prophylaxis - the patient will likely be undergoing an     endoscopic procedure in the morning.  I have chosen not to initiate     heparin or heparin like DVT prophylaxis.  We will place PAS     devices.  We do not have the option of discontinuing his aspirin     and Plavix, since he does have multiple stents.  I do feel however     that there is a reasonable chance he will need biopsies and I will     therefore do not wish to expose him to further risk of adding     heparin-like anticoagulant.     Lonia Blood, M.D.     JTM/MEDQ  D:  03/11/2011  T:  03/11/2011  Job:  [id number]  cc:   Dr. Collier Salina, MD, [facility]  Electronically Signed by Jetty Duhamel M.D. on 03/18/2011 05:03:31 PM

## 2011-03-20 NOTE — Discharge Summary (Signed)
NAMEKASYN, ROLPH NO.:  [id number]  MEDICAL RECORD NO.:  [id number]  LOCATION:  5120                         FACILITY:  [facility]  PHYSICIAN:  Rosanna Randy, MDDATE OF BIRTH:  28-Dec-1932  DATE OF ADMISSION:  03/11/2011 DATE OF DISCHARGE:                              DISCHARGE SUMMARY   PRIMARY CARE PHYSICIAN:  Warrick Parisian, MD.  GASTROENTEROLOGIST:  Anselmo Rod, MD, [facility].  DISCHARGE DIAGNOSES: 1. Right upper quadrant pain, at this point, unclear etiology, suspect     IBS versus dyspepsia. 2. Coronary artery disease status post multiple stents. 3. Benign prostatic hypertrophy. 4. Hypertension. 5. Hyperlipidemia. 6. History of gastritis. 7. Diabetes. 8. Anxiety. 9. Insomnia. 10.Osteoarthritis. 11.Depression. 12.Bilateral renal cyst. 13.Hypercalcemia. 14.Dehydration.  DISCHARGE MEDICATIONS: 1. Oxycodone 5 mg immediate release tablet, 1 tablet by mouth every 6     hours as needed for pain. 2. Sucralfate 1 g suspension by mouth before meals and at bedtime. 3. Nexium 40 mg 1 capsule by mouth twice a day. 4. Aspirin 81 mg 1 tablet by mouth every morning. 5. Calcium 500 mg over-the-counter 1 tablet by mouth twice a day. 6. Doxazosin 8 mg 1 tablet by mouth daily at bedtime. 7. Glucophage 500 mg 1 tablet by mouth daily at bedtime. 8. Imdur 15 mg by mouth daily at bedtime. 9. Metoprolol-XL 50 mg 1 tablet by mouth every morning. 10.Multivitamins 1 tablet by mouth daily. 11.Niaspan 750 mg 2 tablets by mouth daily at bedtime. 12.Paxil 20 mg 1 tablet by mouth every morning. 13.Plavix 75 mg 1 tablet by mouth every morning. 14.Trazodone 50 mg 1 tablet by mouth daily at bedtime. 15.Tylenol Extra Strength 500 mg 2 tablets by mouth twice a day as     needed for pain. 16.Diclofenac sodium gel 1%, 1 application topically daily as needed     for pain over his joints. 17.Vytorin 10/40 one tablet by mouth daily. 18.Xanax 1 mg 1 tablet by mouth twice a  day, 1 in the morning, 1 in     the evening.  DISPOSITION AND FOLLOWUP:  The patient had been discharged in stable and improved condition.  Currently, not having any nausea or vomiting, tolerating p.o. without difficulties, but is still with some intermittent pain that according to him, has not been associated with any food intake.  At this moment after having a CT scan of the abdomen and pelvis with contrast, ultrasound of the abdomen, specifically focusing on the right upper quadrant and also an endoscopy without any abnormalities that will explain his pain.  The patient has been discharged home with some pain medications and follow up appointment on Monday with Dr. Charna Elizabeth, his gastroenterologist.  Plan has been discussed with Dr. Loreta Ave and she will be following the patient. Following her recommendations, his PPI has been increased to twice a day and he had been now placed on sucralfate 1 g 4 times a day.  The patient has been also instructed to avoid any NSAIDs, to follow a low carbohydrate diet, to take his medication as prescribed.  He will arrange a followup appointment with her primary care physician Dr. Creta Levin over the next 2 weeks in order  to continue treatment of his chronic medical problems.  PROCEDURE PERFORMED DURING THIS HOSPITALIZATION: 1. The patient had a CT of the abdomen and pelvis with contrast on     October 24 that demonstrated no acute findings or evidence to     explain the patient's history of abdominal pain. 2. On October 26, he has a ultrasound of the abdomen that demonstrated     no gallstones, no gallbladder dilatation, or pericholecystic fluid.     There was some mild gallbladder wall thickening noted, measuring 4     mm which is nonspecific, and at this point, doubtful of any     clinical significance.  The ultrasound also demonstrated small     bilateral renal cysts incidentally noted with no evidence of renal     mass or hydronephrosis.  The  patient also had endoscopy performed     by Dr. Elnoria Howard during this hospitalization that did not demonstrated     any abnormalities except for some reflux.  For full reports, please     refer to his dictation, procedure was done on March 13, 2011.  No     other procedures were performed during this hospitalization.  GI     was consulted during this admission.  PERTINENT LABORATORY DATA:  Includes, urinalysis with a trace of leukocytes, negative nitrite.  Microscopy showing 3-6 white blood cells, and a negative urine culture.  CBC with differential with a white blood cells of 8.1, hemoglobin 13.6, platelets 136.  Normal lipase of 46. Normal LFTs with bilirubin at 0.4, alkaline phosphatase 69, AST 21, ALT 22, total protein 6.6, albumin 4.2, calcium 10.6 on admission. Hemoglobin A1c demonstrated a 6.9 value and at discharge CBC showed a white blood cells of 7.2, hemoglobin 13, platelets 130.  HISTORY OF PRESENT ILLNESS:  For full details, please refer to dictation done by Dr. Sharon Seller on March 11, 2011, but briefly this is a 77 year old gentleman who has a remote history of gastritis, diagnosed by endoscopy in 2005 by Dr. Charna Elizabeth, who came into the hospital complaining of approximately 2 weeks of intermittent epigastric discomfort with some radiation into his right upper quadrant.  The patient has been using Mobic as needed for his osteoarthritis, and after he reports having this problem, he was instructed to stop the NSAID and to change his omeprazole to Nexium once a day.  Unfortunately, they had not started to improve his symptoms to a significant extent, as a matter of fact, the patient feels that his pain is worse.  Due to these symptoms, the patient came into the emergency department for further evaluation and treatment.  HOSPITAL COURSE BY PROBLEM: 1. Right upper quadrant pain.  At this point, etiology continued to be     unclear, suspecting that might be dyspepsia versus  IBS.  After     discussing the case with Dr. Loreta Ave, the patient is going to be     discharged on pain medication and is going to follow with her on     Monday for further evaluation and treatment.  He had been placed on     PPI twice a day and also sucralfate. 2. Coronary artery disease with multiple stents.  We will continue     using his Plavix and also his baby aspirin on daily basis.  He had     been instructed to avoid any NSAIDs and will continue taking the     rest of his medications as previously indicated.  The patient will     follow with Dr. Nanetta Batty as previously instructed. 3. BPH.  We are going to continue using dose of Zosyn. 4. Hypertension, well controlled.  No changes have been made to his     medications: 5. Hyperlipidemia.  We are going to continue using by Vytorin and     Niaspan. 6. History of gastritis.  He is now on Nexium twice a day. 7. Diabetes mellitus.  We are going to continue the use of metformin.     A1c is 6.9. 8. Anxiety and depression.  We are going to continue the use of Paxil     and also p.r.n. Xanax. 9. History of insomnia.  The patient will continue using his trazodone     at bedtime. 10.Osteoarthritis.  The patient instructed to use Tylenol and now with     a prescription for oxycodone. 11.Hypercalcemia and mild dehydration most likely due to the ongoing     pain, decreased appetite and intake.  After fluid resuscitation,     the patient's calcium completely corrected.  His volume status is     back to normal. 12.Transient hyperkalemia, reported on his lab work for March 12, 2011, which was secondary to hemolysis.  At discharge, the     patient's potassium in the normal range. 13.Mild renal insufficiency.  After receiving contrast, which     corrected appropriately with fluid resuscitation.  PHYSICAL EXAMINATION:  VITAL SIGNS:  At discharge, temperature 98.0, heart rate 76, respiratory rate 18, blood pressure 114/63, O2  saturation 94% on room air. GENERAL:  The patient was in no acute distress. RESPIRATORY SYSTEM:  Clear to auscultation bilaterally. HEART:  Regular rate and rhythm.  S1 and S2 appreciated. ABDOMEN:  Soft.  Positive bowel sounds at the moment of examination.  No tenderness.  No distention. EXTREMITIES:  No edema or cyanosis. NEUROLOGIC:  Nonfocal.     Rosanna Randy, MD     CEM/MEDQ  D:  03/14/2011  T:  03/14/2011  Job:  [id number]  cc:   Warrick Parisian, MD Jyothi Elsie Amis, MD, [facility]  Electronically Signed by Vassie Loll MD on 03/20/2011 10:43:11 PM

## 2011-03-21 ENCOUNTER — Inpatient Hospital Stay (HOSPITAL_COMMUNITY)
Admission: EM | Admit: 2011-03-21 | Discharge: 2011-03-26 | DRG: 074 | Disposition: A | Discharge status: Home / Self Care | Payer: PRIVATE HEALTH INSURANCE | Attending: Internal Medicine | Admitting: Internal Medicine

## 2011-03-21 ENCOUNTER — Emergency Department (HOSPITAL_COMMUNITY): Payer: PRIVATE HEALTH INSURANCE

## 2011-03-21 DIAGNOSIS — E1143 Type 2 diabetes mellitus with diabetic autonomic (poly)neuropathy: Secondary | ICD-10-CM

## 2011-03-21 DIAGNOSIS — N400 Enlarged prostate without lower urinary tract symptoms: Secondary | ICD-10-CM | POA: Diagnosis present

## 2011-03-21 DIAGNOSIS — R112 Nausea with vomiting, unspecified: Secondary | ICD-10-CM | POA: Diagnosis present

## 2011-03-21 DIAGNOSIS — I10 Essential (primary) hypertension: Secondary | ICD-10-CM | POA: Diagnosis present

## 2011-03-21 DIAGNOSIS — E1159 Type 2 diabetes mellitus with other circulatory complications: Secondary | ICD-10-CM | POA: Diagnosis present

## 2011-03-21 DIAGNOSIS — F411 Generalized anxiety disorder: Secondary | ICD-10-CM | POA: Diagnosis present

## 2011-03-21 DIAGNOSIS — I739 Peripheral vascular disease, unspecified: Secondary | ICD-10-CM

## 2011-03-21 DIAGNOSIS — Z79899 Other long term (current) drug therapy: Secondary | ICD-10-CM

## 2011-03-21 DIAGNOSIS — K219 Gastro-esophageal reflux disease without esophagitis: Secondary | ICD-10-CM | POA: Diagnosis present

## 2011-03-21 DIAGNOSIS — E1165 Type 2 diabetes mellitus with hyperglycemia: Secondary | ICD-10-CM

## 2011-03-21 DIAGNOSIS — E785 Hyperlipidemia, unspecified: Secondary | ICD-10-CM | POA: Diagnosis present

## 2011-03-21 DIAGNOSIS — Z7982 Long term (current) use of aspirin: Secondary | ICD-10-CM

## 2011-03-21 DIAGNOSIS — R109 Unspecified abdominal pain: Secondary | ICD-10-CM | POA: Diagnosis present

## 2011-03-21 DIAGNOSIS — E1151 Type 2 diabetes mellitus with diabetic peripheral angiopathy without gangrene: Secondary | ICD-10-CM | POA: Diagnosis present

## 2011-03-21 DIAGNOSIS — E1149 Type 2 diabetes mellitus with other diabetic neurological complication: Principal | ICD-10-CM | POA: Diagnosis present

## 2011-03-21 DIAGNOSIS — Z7902 Long term (current) use of antithrombotics/antiplatelets: Secondary | ICD-10-CM

## 2011-03-21 DIAGNOSIS — I2510 Atherosclerotic heart disease of native coronary artery without angina pectoris: Secondary | ICD-10-CM | POA: Diagnosis present

## 2011-03-21 DIAGNOSIS — Z880 Allergy status to penicillin: Secondary | ICD-10-CM

## 2011-03-21 DIAGNOSIS — Z9861 Coronary angioplasty status: Secondary | ICD-10-CM

## 2011-03-21 DIAGNOSIS — K5900 Constipation, unspecified: Secondary | ICD-10-CM | POA: Diagnosis present

## 2011-03-21 DIAGNOSIS — I798 Other disorders of arteries, arterioles and capillaries in diseases classified elsewhere: Secondary | ICD-10-CM | POA: Diagnosis present

## 2011-03-21 DIAGNOSIS — K3184 Gastroparesis: Secondary | ICD-10-CM

## 2011-03-21 LAB — DIFFERENTIAL
Basophils Absolute: 0 10*3/uL (ref 0.0–0.1)
Basophils Relative: 0 % (ref 0–1)
Eosinophils Absolute: 0.7 10*3/uL (ref 0.0–0.7)
Eosinophils Relative: 6 % — ABNORMAL HIGH (ref 0–5)
Lymphs Abs: 1.8 10*3/uL (ref 0.7–4.0)

## 2011-03-21 LAB — TROPONIN I: Troponin I: 0.31 ng/mL (ref <0.30)

## 2011-03-21 LAB — COMPREHENSIVE METABOLIC PANEL
AST: 19 U/L (ref 0–37)
Albumin: 3.9 g/dL (ref 3.5–5.2)
Chloride: 101 mEq/L (ref 96–112)
Creatinine, Ser: 1.18 mg/dL (ref 0.50–1.35)
Sodium: 137 mEq/L (ref 135–145)
Total Bilirubin: 0.4 mg/dL (ref 0.3–1.2)

## 2011-03-21 LAB — CBC
MCV: 92.1 fL (ref 78.0–100.0)
Platelets: 148 10*3/uL — ABNORMAL LOW (ref 150–400)
RDW: 13.7 % (ref 11.5–15.5)
WBC: 11.8 10*3/uL — ABNORMAL HIGH (ref 4.0–10.5)

## 2011-03-21 LAB — GLUCOSE, CAPILLARY

## 2011-03-21 LAB — URINALYSIS, ROUTINE W REFLEX MICROSCOPIC
Bilirubin Urine: NEGATIVE
Leukocytes, UA: NEGATIVE
Nitrite: NEGATIVE
Specific Gravity, Urine: 1.015 (ref 1.005–1.030)
pH: 6 (ref 5.0–8.0)

## 2011-03-21 MED ORDER — TECHNETIUM TC 99M MEBROFENIN IV KIT
5.0000 | PACK | Freq: Once | INTRAVENOUS | Status: AC | PRN
  Administered 2011-03-21: 5 via INTRAVENOUS

## 2011-03-22 ENCOUNTER — Inpatient Hospital Stay (HOSPITAL_COMMUNITY): Payer: PRIVATE HEALTH INSURANCE

## 2011-03-22 ENCOUNTER — Other Ambulatory Visit: Payer: Self-pay

## 2011-03-22 DIAGNOSIS — N400 Enlarged prostate without lower urinary tract symptoms: Secondary | ICD-10-CM | POA: Diagnosis present

## 2011-03-22 DIAGNOSIS — E785 Hyperlipidemia, unspecified: Secondary | ICD-10-CM | POA: Diagnosis present

## 2011-03-22 DIAGNOSIS — I739 Peripheral vascular disease, unspecified: Secondary | ICD-10-CM | POA: Diagnosis present

## 2011-03-22 DIAGNOSIS — IMO0002 Reserved for concepts with insufficient information to code with codable children: Secondary | ICD-10-CM | POA: Diagnosis present

## 2011-03-22 DIAGNOSIS — R112 Nausea with vomiting, unspecified: Secondary | ICD-10-CM

## 2011-03-22 DIAGNOSIS — I10 Essential (primary) hypertension: Secondary | ICD-10-CM | POA: Diagnosis present

## 2011-03-22 DIAGNOSIS — R1013 Epigastric pain: Secondary | ICD-10-CM

## 2011-03-22 DIAGNOSIS — E1165 Type 2 diabetes mellitus with hyperglycemia: Secondary | ICD-10-CM | POA: Diagnosis present

## 2011-03-22 DIAGNOSIS — R109 Unspecified abdominal pain: Secondary | ICD-10-CM | POA: Diagnosis present

## 2011-03-22 DIAGNOSIS — I2510 Atherosclerotic heart disease of native coronary artery without angina pectoris: Secondary | ICD-10-CM | POA: Diagnosis present

## 2011-03-22 LAB — COMPREHENSIVE METABOLIC PANEL
Alkaline Phosphatase: 69 U/L (ref 39–117)
BUN: 17 mg/dL (ref 6–23)
Chloride: 109 mEq/L (ref 96–112)
Creatinine, Ser: 1.19 mg/dL (ref 0.50–1.35)
GFR calc Af Amer: 67 mL/min — ABNORMAL LOW (ref >90)
GFR calc non Af Amer: 58 mL/min — ABNORMAL LOW (ref >90)
Glucose, Bld: 94 mg/dL (ref 70–99)
Potassium: 4.6 mEq/L (ref 3.5–5.1)
Total Bilirubin: 0.3 mg/dL (ref 0.3–1.2)

## 2011-03-22 LAB — LIPID PANEL
Cholesterol: 76 mg/dL (ref 0–200)
HDL: 18 mg/dL — ABNORMAL LOW (ref >39)
LDL Cholesterol: 33 mg/dL (ref 0–99)
Total CHOL/HDL Ratio: 3.3 RATIO
Triglycerides: 69 mg/dL (ref <150)
VLDL: 14 mg/dL (ref 0–40)
VLDL: 8 mg/dL (ref 0–40)

## 2011-03-22 LAB — CARDIAC PANEL(CRET KIN+CKTOT+MB+TROPI)
CK, MB: 3 ng/mL (ref 0.3–4.0)
Total CK: 40 U/L (ref 7–232)
Total CK: 52 U/L (ref 7–232)
Troponin I: <0.3 ng/mL (ref <0.30)

## 2011-03-22 LAB — CBC
HCT: 41.8 % (ref 39.0–52.0)
Hemoglobin: 14 g/dL (ref 13.0–17.0)
MCH: 31.3 pg (ref 26.0–34.0)
MCHC: 33.5 g/dL (ref 30.0–36.0)
RBC: 4.48 MIL/uL (ref 4.22–5.81)

## 2011-03-22 LAB — DIFFERENTIAL
Basophils Relative: 0 % (ref 0–1)
Lymphs Abs: 1.9 10*3/uL (ref 0.7–4.0)
Monocytes Absolute: 0.7 10*3/uL (ref 0.1–1.0)
Monocytes Relative: 6 % (ref 3–12)
Neutro Abs: 8.2 10*3/uL — ABNORMAL HIGH (ref 1.7–7.7)
Neutrophils Relative %: 70 % (ref 43–77)

## 2011-03-22 LAB — HEMOGLOBIN A1C: Hgb A1c MFr Bld: 6.8 % — ABNORMAL HIGH (ref <5.7)

## 2011-03-22 LAB — GLUCOSE, CAPILLARY: Glucose-Capillary: 110 mg/dL — ABNORMAL HIGH (ref 70–99)

## 2011-03-22 LAB — LIPASE, BLOOD: Lipase: 21 U/L (ref 11–59)

## 2011-03-22 MED ORDER — METFORMIN HCL 500 MG PO TABS
500.0000 mg | ORAL_TABLET | Freq: Every day | ORAL | Status: DC
  Filled 2011-03-22: qty 1

## 2011-03-22 MED ORDER — GI COCKTAIL ~~LOC~~
30.0000 mL | Freq: Three times a day (TID) | ORAL | Status: DC | PRN
  Administered 2011-03-22: 30 mL via ORAL
  Filled 2011-03-22: qty 30

## 2011-03-22 MED ORDER — INSULIN ASPART 100 UNIT/ML ~~LOC~~ SOLN
0.0000 [IU] | Freq: Every day | SUBCUTANEOUS | Status: DC
  Filled 2011-03-22: qty 3

## 2011-03-22 MED ORDER — ONDANSETRON HCL 4 MG/2ML IJ SOLN: 4.0000 mg | Freq: Four times a day (QID) | INTRAMUSCULAR | Status: DC | PRN

## 2011-03-22 MED ORDER — ENOXAPARIN SODIUM 40 MG/0.4ML ~~LOC~~ SOLN
40.0000 mg | SUBCUTANEOUS | Status: DC
  Administered 2011-03-22 – 2011-03-25 (×4): 40 mg via SUBCUTANEOUS
  Filled 2011-03-22 (×7): qty 0.4

## 2011-03-22 MED ORDER — PANTOPRAZOLE SODIUM 40 MG PO TBEC
40.0000 mg | DELAYED_RELEASE_TABLET | Freq: Every day | ORAL | Status: DC
  Administered 2011-03-23 – 2011-03-26 (×4): 40 mg via ORAL
  Filled 2011-03-22 (×7): qty 1

## 2011-03-22 MED ORDER — ASPIRIN EC 81 MG PO TBEC
81.0000 mg | DELAYED_RELEASE_TABLET | Freq: Every day | ORAL | Status: DC
  Administered 2011-03-22 – 2011-03-26 (×5): 81 mg via ORAL
  Filled 2011-03-22 (×6): qty 1

## 2011-03-22 MED ORDER — MORPHINE SULFATE 2 MG/ML IJ SOLN
2.0000 mg | INTRAMUSCULAR | Status: DC | PRN
Start: 2011-03-22 — End: 2011-03-26

## 2011-03-22 MED ORDER — NIACIN ER 500 MG PO CPCR
750.0000 mg | ORAL_CAPSULE | Freq: Every day | ORAL | Status: DC
  Administered 2011-03-22 – 2011-03-25 (×4): 750 mg via ORAL
  Filled 2011-03-22 (×6): qty 1

## 2011-03-22 MED ORDER — EZETIMIBE-SIMVASTATIN 10-40 MG PO TABS
1.0000 | ORAL_TABLET | Freq: Every day | ORAL | Status: DC
  Administered 2011-03-22 – 2011-03-25 (×4): 1 via ORAL
  Filled 2011-03-22 (×6): qty 1

## 2011-03-22 MED ORDER — DOXAZOSIN MESYLATE 8 MG PO TABS
8.0000 mg | ORAL_TABLET | Freq: Every day | ORAL | Status: DC
  Administered 2011-03-22 – 2011-03-25 (×4): 8 mg via ORAL
  Filled 2011-03-22 (×6): qty 1

## 2011-03-22 MED ORDER — ALPRAZOLAM 1 MG PO TABS
1.0000 mg | ORAL_TABLET | Freq: Two times a day (BID) | ORAL | Status: DC
  Administered 2011-03-22 – 2011-03-23 (×3): 1 mg via ORAL
  Filled 2011-03-22: qty 1

## 2011-03-22 MED ORDER — CLOPIDOGREL BISULFATE 75 MG PO TABS
75.0000 mg | ORAL_TABLET | Freq: Every day | ORAL | Status: DC
  Administered 2011-03-22 – 2011-03-26 (×5): 75 mg via ORAL
  Filled 2011-03-22 (×6): qty 1

## 2011-03-22 MED ORDER — METOPROLOL SUCCINATE ER 50 MG PO TB24
50.0000 mg | ORAL_TABLET | Freq: Every day | ORAL | Status: DC
  Administered 2011-03-22 – 2011-03-26 (×5): 50 mg via ORAL
  Filled 2011-03-22 (×6): qty 1

## 2011-03-22 MED ORDER — PHENOL 1.4 % MT LIQD: 1.0000 | OROMUCOSAL | Status: DC | PRN

## 2011-03-22 MED ORDER — THERA M PLUS PO TABS
1.0000 | ORAL_TABLET | Freq: Every day | ORAL | Status: DC
  Administered 2011-03-22 – 2011-03-23 (×2): 1 via ORAL
  Administered 2011-03-24: 10:00:00 via ORAL
  Administered 2011-03-25 – 2011-03-26 (×2): 1 via ORAL
  Filled 2011-03-22 (×6): qty 1

## 2011-03-22 MED ORDER — INSULIN ASPART 100 UNIT/ML ~~LOC~~ SOLN
0.0000 [IU] | Freq: Three times a day (TID) | SUBCUTANEOUS | Status: DC
  Administered 2011-03-22 – 2011-03-24 (×3): 2 [IU] via SUBCUTANEOUS
  Administered 2011-03-25 – 2011-03-26 (×5): 1 [IU] via SUBCUTANEOUS
  Filled 2011-03-22: qty 3

## 2011-03-22 MED ORDER — ACETAMINOPHEN 325 MG PO TABS: 650.0000 mg | ORAL_TABLET | ORAL | Status: DC | PRN

## 2011-03-22 MED ORDER — TRAZODONE HCL 50 MG PO TABS
50.0000 mg | ORAL_TABLET | Freq: Every evening | ORAL | Status: DC | PRN
  Administered 2011-03-24 – 2011-03-25 (×2): 50 mg via ORAL
  Filled 2011-03-22 (×3): qty 1

## 2011-03-22 MED ORDER — CALCIUM CARBONATE-VITAMIN D 500-200 MG-UNIT PO TABS
1.0000 | ORAL_TABLET | Freq: Every day | ORAL | Status: DC
  Administered 2011-03-22 – 2011-03-23 (×2): 1 via ORAL
  Administered 2011-03-24: 10:00:00 via ORAL
  Administered 2011-03-25 – 2011-03-26 (×2): 1 via ORAL
  Filled 2011-03-22 (×6): qty 1

## 2011-03-22 MED ORDER — ALPRAZOLAM 1 MG PO TABS
ORAL_TABLET | ORAL | Status: AC
  Administered 2011-03-22: 1 mg via ORAL
  Filled 2011-03-22: qty 1

## 2011-03-22 MED ORDER — ONDANSETRON HCL 4 MG PO TABS
4.0000 mg | ORAL_TABLET | Freq: Four times a day (QID) | ORAL | Status: DC | PRN
  Administered 2011-03-22 – 2011-03-25 (×2): 4 mg via ORAL
  Filled 2011-03-22: qty 1

## 2011-03-22 MED ORDER — OXYCODONE HCL 5 MG PO TABS: 5.0000 mg | ORAL_TABLET | ORAL | Status: DC | PRN

## 2011-03-22 MED ORDER — PAROXETINE HCL 20 MG PO TABS
20.0000 mg | ORAL_TABLET | Freq: Every day | ORAL | Status: DC
  Administered 2011-03-22 – 2011-03-25 (×4): 20 mg via ORAL
  Filled 2011-03-22 (×6): qty 1

## 2011-03-22 MED ORDER — ISOSORBIDE MONONITRATE 15 MG HALF TABLET
15.0000 mg | ORAL_TABLET | Freq: Every day | ORAL | Status: DC
  Administered 2011-03-22 – 2011-03-25 (×4): 15 mg via ORAL
  Filled 2011-03-22 (×6): qty 1

## 2011-03-22 MED ORDER — ONDANSETRON HCL 4 MG PO TABS
ORAL_TABLET | ORAL | Status: AC
  Administered 2011-03-22: 4 mg via ORAL
  Filled 2011-03-22: qty 1

## 2011-03-22 MED ORDER — SODIUM CHLORIDE 0.9 % IV SOLN
INTRAVENOUS | Status: DC
  Administered 2011-03-22: 09:00:00 via INTRAVENOUS

## 2011-03-22 NOTE — Consult Note (Signed)
I have reviewed records, taken a history and examined the patient.  I agree with current management plan.  Venita Lick. Russella Dar MD [facility] 03/22/2011, 11:28 AM

## 2011-03-22 NOTE — H&P (Signed)
NAMEGAYNOR, Marvin Leon NO.:  [id number]  MEDICAL RECORD NO.:  [id number]  LOCATION:  1427                         FACILITY:  [facility]  PHYSICIAN:  Arne Cleveland, MD       DATE OF BIRTH:  04-20-1933  DATE OF ADMISSION:  03/21/2011 DATE OF DISCHARGE:                             HISTORY & PHYSICAL   PRIMARY CARE PHYSICIAN:  Warrick Parisian, MD.  GASTROINTESTINAL PHYSICIAN:  Jyothi Elsie Amis, MD, [facility].  CARDIOLOGIST:  Nanetta Batty, M.D.  CHIEF COMPLAINT:  Epigastric abdominal pain and nausea.  This has been going on for several weeks.  He was recently hospitalized.  HISTORY OF PRESENT ILLNESS:  77 year old Caucasian male who was hospitalized at [facility] for epigastric abdominal pain and nausea on October 25 and DC'd on October 27.  While he was there, he had endoscopy which showed some reflux, but not much else.  CT scan was negative. Abdominal ultrasound was negative.  Patient returns today with similar symptoms,  epigastric abdominal pain, and nausea, and was seen in the emergency room.  A HIDA scan was done because of question of whether he had a dysfunctional gallbladder, but the HIDA scan came back normal; however, the patient has been having significant abdominal pain,  has received morphine, Zofran, and Phenergan for his symptoms here in the ER, and the family is concerned on what was going on and needs to be determined what the problem is.  PAST MEDICAL HISTORY:  Significant for, 1. Hypertension. 2. Diabetes type 2. 3. Coronary artery disease.  He is status post stent, had a recent     stress test a few months ago that was negative, and had a     catheterizations in 2010, which showed his stents were patent and     that there was no significant coronary artery disease. 4. He also has peripheral vascular disease which is being followed. 5. Benign prostatic hypertrophy.  FAMILY HISTORY:  Has 2 brothers with coronary artery disease.  SOCIAL HISTORY:   He is married, retired.  Tobacco farmer.  He has history of tobacco use in the past, but currently he is not smoking, drinking, or using any illicit drugs.  MEDICATIONS:  Niaspan, Vytorin, doxazosin, Trazodone, alprazolam, metoprolol, Plavix, oxycodone with Tylenol, Percocet, metformin, multivitamins, isosorbide mononitrate, Dexilant for his GI symptoms, aspirin, and had been given a prescription for GI cocktail, but has not used it.  ALLERGIES:  To PENICILLIN.  REVIEW OF SYSTEMS:  As per history of present illness.  He has had nausea and the epigastric pain, but denies any vomitus, hematemesis, fever, chills, night sweats, rash or other symptoms.  His only findings on review of systems is his multiple medical problems and the epigastric pain.  Comprehensive review of system was otherwise negative for any positive findings.  PHYSICAL EXAMINATION:  VITAL SIGNS:  His temperature is 97.9, pulse 72, respirations 18, blood pressure 123/70. GENERAL:  Currently, he is in no acute distress, but he has been treated with medication for pain.  He is well developed, well nourished. HEENT:  Examination of his head is atraumatic, normocephalic.  Eyes, pupils equal, round, reactive to light.  Discs sharp.  Extraocular muscle range of motion is full.  Oropharynx, mucous membranes are moist and there is no erythema or exudate noted. NECK:  Supple without jugular venous distention, thyromegaly, or thyroid mass. CHEST:  Nontender. LUNGS:  Clear to auscultation and percussion. HEART:  Regular rhythm and rate.  Normal S1, S2 without murmur, gallop, or rub.  ABDOMEN:  Soft, nontender with normoactive bowel sounds and there is no hepatomegaly,  splenomegaly, or palpable mass. RECTAL:  Exam is deferred at patient's request. GENITOURINARY:  Exam is normal. EXTREMITIES:  There is no clubbing, cyanosis, or edema. SKIN:  No unusual rash or lesion noted. NEUROLOGIC:  He is awake, alert, oriented x3.   Cranial nerves II through XII are intact.  Motor strength is 5/5 in upper and lower extremities. Sensory intact to fine touch and pinprick.  LABORATORIES:  Urinalysis is negative.  Troponin, normal is less than 0.30, his is 0.31.  Normal hepatobiliary patency scan.  His comprehensive metabolic panel is normal except for a glucose of 167, calcium is elevated at 10.8 and his GFR is low at 58, normal being greater than 90, his creatinine is 1.18, normal is 0.5 to 1.35.  His white count is 11800, hemoglobin is 15.2, hematocrit 44.5 with normal differential.  IMPRESSION: 1. Epigastric abdominal pain, etiology uncertain. 2. Other medical problems include slightly elevated troponin which     will be repeated, with a normal EKG. 3. Diabetes type 2, poorly controlled. 4. He has abdominal pain and persistent nausea, etiology uncertain. 5. He has hypertension. 6. Hyperlipidemia. 7. Anxiety. 8. Gastroesophageal reflux disease.  PLAN:  To admit the patient for management of his pain, management of his nausea, obtain a GI consult, and further evaluation and treatment as indicated by hospital course.  I will repeat troponin and EKG in the morning as well as routine labs.  His lipase was normal, but I will repeat that as well.  Patient is being admitted to team 8.     Arne Cleveland, MD     ML/MEDQ  D:  03/21/2011  T:  03/22/2011  Job:  [id number]  cc:   Warrick Parisian, MD Fax: [phone]  Anselmo Rod, MD, [facility] Fax: [phone]

## 2011-03-22 NOTE — Consult Note (Signed)
Referring Provider:  [facility] Primary Care Physician:  No primary provider on file. Primary Gastroenterologist:  Dr.Jyothi Mann  Reason for Consultation: Epigastric abdominal pain, Nausea and vomiting, persistent x 3 weeks.  HPI: Marvin Leon is a 77 y.o. male with multiple medical problems as outlined below who was admitted to [facility] with the same complaints from 10/25-10/27. He has had N/V and epigastric pain for 3 weeks. He complains of aching, burning epigastric pain, belching, intermittent vomiting. No definite etiology was found and he was discharged home and now is readmitted with same complaints. He had a negative EGD by Dr. Elnoria Howard. Negative abd/pelvic CT except for duodenal diverticulae. Negative abd [place]. Negative CCK-HIDA yesterday. He just started on Dexilant and a probiotic. He also recommended to start a GI cocktail but did not start this.   PAST MEDICAL HISTORY:   1. Coronary artery disease status post PTCA with placement of multiple       stents.       a.     Followed by Dr. Nanetta Batty       b.     Patent stents via cardiac cath September 2010.       c.     No more ejection fraction via cardiac cath September 2010.  d.     Right lower extremity peripheral vascular  disease-further        details unavailable at present.       e.     Benign prostatic hypertrophy.       f.     Remote history of nephrolithiasis.       g.     Hyperlipidemia.       h.     History of gastritis, diagnosed via endoscopy in 2005.       i.     Diabetes mellitus.        ALLERGIES:  No known drug allergies.      FAMILY HISTORY:  Noncontributory.      SOCIAL HISTORY:  The patient is married.  He lives in the [place],   [place] area.  He is a former tobacco farmer and also worked in   [employer], but has been retired for quite some time.  He   has a remote history of tobacco abuse.  He does not drink alcohol.    Prior to Admission medications   Medication Sig Start  Date End Date Taking? Authorizing Provider  acidophilus (RISAQUAD) CAPS Take 1 capsule by mouth daily.     Yes Historical Provider, MD  ALPRAZolam Prudy Feeler) 1 MG tablet Take 1 mg by mouth 2 (two) times daily.     Yes Historical Provider, MD  aspirin EC 81 MG tablet Take 81 mg by mouth every morning.     Yes Historical Provider, MD  Calcium Carbonate-Vitamin D (CALCIUM 600/VITAMIN D) 600-400 MG-UNIT per tablet Take 1 tablet by mouth daily.     Yes Historical Provider, MD  clopidogrel (PLAVIX) 75 MG tablet Take 75 mg by mouth daily.     Yes Historical Provider, MD  dexlansoprazole (DEXILANT) 60 MG capsule Take 60 mg by mouth daily.     Yes Historical Provider, MD  doxazosin (CARDURA) 8 MG tablet Take 8 mg by mouth at bedtime.     Yes Historical Provider, MD  ezetimibe-simvastatin (VYTORIN) 10-40 MG per tablet Take 1 tablet by mouth daily.     Yes Historical Provider, MD  isosorbide mononitrate (IMDUR) 30 MG 24 hr tablet Take  15 mg by mouth at bedtime.     Yes Historical Provider, MD  metFORMIN (GLUCOPHAGE) 500 MG tablet Take 500 mg by mouth at bedtime.     Yes Historical Provider, MD  metoprolol (TOPROL-XL) 50 MG 24 hr tablet Take 50 mg by mouth every morning.     Yes Historical Provider, MD  Multiple Vitamins-Minerals (MULTIVITAMINS THER. W/MINERALS) TABS Take 1 tablet by mouth daily.     Yes Historical Provider, MD  niacin (NIASPAN) 750 MG CR tablet Take 1,500 mg by mouth at bedtime.     Yes Historical Provider, MD  oxyCODONE (OXY IR/ROXICODONE) 5 MG immediate release tablet Take 5 mg by mouth every 6 (six) hours as needed. For pain    Yes Historical Provider, MD  PARoxetine (PAXIL) 20 MG tablet Take 20 mg by mouth every evening.     Yes Historical Provider, MD  sucralfate (CARAFATE) 1 GM/10ML suspension Take 1.5 g by mouth 4 (four) times daily -  before meals and at bedtime.     Yes Historical Provider, MD  traZODone (DESYREL) 50 MG tablet Take 50 mg by mouth at bedtime.     Yes Historical Provider,  MD    Current Facility-Administered Medications  Medication Dose Route Frequency Provider Last Rate Last Dose  . 0.9 %  sodium chloride infusion   Intravenous Continuous Otho Bellows, PHARMD 75 mL/hr at 03/22/11 0911    . acetaminophen (TYLENOL) tablet 650 mg  650 mg Oral Q4H PRN Otho Bellows, PHARMD      . ALPRAZolam Prudy Feeler) tablet 1 mg  1 mg Oral BID Larey Dresser, PHARMD   1 mg at 03/22/11 0909  . aspirin EC tablet 81 mg  81 mg Oral Daily Larey Dresser, PHARMD   81 mg at 03/22/11 0908  . calcium-vitamin D (OSCAL WITH D) 500-200 MG-UNIT per tablet 1 tablet  1 tablet Oral Daily Larey Dresser, PHARMD   1 tablet at 03/22/11 0910  . clopidogrel (PLAVIX) tablet 75 mg  75 mg Oral Q breakfast Larey Dresser, PHARMD   75 mg at 03/22/11 0454  . doxazosin (CARDURA) tablet 8 mg  8 mg Oral QHS Rebekah Sol Passer, PHARMD      . ezetimibe-simvastatin (VYTORIN) 10-40 MG per tablet 1 tablet  1 tablet Oral q1800 Rebekah Sol Passer, PHARMD      . insulin aspart (novoLOG) injection 0-5 Units  0-5 Units Subcutaneous QHS Terri L Green, PHARMD      . insulin aspart (novoLOG) injection 0-9 Units  0-9 Units Subcutaneous TID WC Terri L Green, PHARMD      . isosorbide mononitrate (IMDUR) 24 hr tablet 15 mg  15 mg Oral QHS Rebekah Sol Passer, PHARMD      . metFORMIN (GLUCOPHAGE) tablet 500 mg  500 mg Oral QHS Rebekah Sol Passer, PHARMD      . metoprolol (TOPROL-XL) 24 hr tablet 50 mg  50 mg Oral Daily Larey Dresser, PHARMD   50 mg at 03/22/11 0908  . morphine 2 MG/ML injection 2 mg  2 mg Intravenous Q4H PRN Otho Bellows, PHARMD      . multivitamins ther. w/minerals tablet 1 tablet  1 tablet Oral Daily Larey Dresser, PHARMD   1 tablet at 03/22/11 0911  . niacin CR capsule 750 mg  750 mg Oral QHS Rebekah Sol Passer, PHARMD      . ondansetron [facility]) injection 4 mg  4 mg Intravenous Q6H PRN Otho Bellows, PHARMD      .  ondansetron (ZOFRAN) tablet  4 mg  4 mg Oral Q6H PRN Otho Bellows, PHARMD      . oxyCODONE (Oxy IR/ROXICODONE) immediate release tablet 5 mg  5 mg Oral Q4H PRN Otho Bellows, PHARMD      . pantoprazole (PROTONIX) EC tablet 40 mg  40 mg Oral Q1200 Rebekah Sol Passer, PHARMD      . PARoxetine (PAXIL) tablet 20 mg  20 mg Oral QHS Rebekah Hough Matthews, PHARMD      . phenol (CHLORASEPTIC) mouth spray 1 spray  1 spray Mouth/Throat PRN Manpreet S Bhutani      . technetium TC 31M mebrofenin (CHOLETEC) injection 5 milli Curie  5 milli Curie Intravenous Once PRN Medication Radiologist   5 milli Curie at 03/21/11 1504  . traZODone (DESYREL) tablet 50 mg  50 mg Oral QHS PRN Rebekah Sol Passer, PHARMD      . DISCONTD: phenol (CHLORASEPTIC) mouth spray 1 spray  1 spray Mouth/Throat PRN Manpreet S Bhutani        Allergies as of 03/21/2011 - never reviewed  Allergen Reaction Noted  . Penicillins Other (See Comments) 03/21/2011  . Propoxyphene and methadone Other (See Comments) 03/21/2011    No family history on file.  History   Social History  . Marital Status: Married    Spouse Name: N/A    Number of Children: N/A  . Years of Education: N/A   Occupational History  . Not on file.   Social History Main Topics  . Smoking status: Not on file  . Smokeless tobacco: Not on file  . Alcohol Use: Not on file  . Drug Use: Not on file  . Sexually Active: Not on file   Other Topics Concern  . Not on file   Social History Narrative  . No narrative on file    Review of Systems: Gen: Denies any fever, chills, sweats, anorexia, fatigue, weakness, malaise, weight loss, and sleep disorder CV: Denies chest pain, angina, palpitations, syncope, orthopnea, PND, peripheral edema, and claudication. Resp: Denies dyspnea at rest, dyspnea with exercise, cough, sputum, wheezing, coughing up blood, and pleurisy. GI: Denies vomiting blood, jaundice, and fecal incontinence.   Denies dysphagia or odynophagia. GU : Denies urinary  burning, blood in urine, urinary frequency, urinary hesitancy, nocturnal urination, and urinary incontinence. MS: Denies joint pain, limitation of movement, and swelling, stiffness, low back pain, extremity pain. Denies muscle weakness, cramps, atrophy.  Derm: Denies rash, itching, dry skin, hives, moles, warts, or unhealing ulcers.  Psych: Denies depression, anxiety, memory loss, suicidal ideation, hallucinations, paranoia, and confusion. Heme: Denies bruising, bleeding, and enlarged lymph nodes. Neuro:  Denies any headaches, dizziness, paresthesias. Endo:  Denies any problems with DM, thyroid, adrenal function.  Physical Exam: Vital signs in last 24 hours: Temp:  [97.6 F (36.4 C)-97.7 F (36.5 C)] 97.6 F (36.4 C) (11/04 0601) Pulse Rate:  [69-75] 75  (11/04 0601) Resp:  [16-18] 18  (11/04 0601) BP: (95-119)/(55-64) 95/55 mmHg (11/04 0601) SpO2:  [93 %-95 %] 95 % (11/04 0601) Weight:  [85.7 kg (188 lb 15 oz)] 188 lb 15 oz (85.7 kg) (11/03 2121)   General:   Alert,  Well-developed, well-nourished, elderly, pleasant and cooperative in NAD Head:  Normocephalic and atraumatic. Eyes:  Sclera clear, no icterus.   Conjunctiva pink. Ears:  Normal auditory acuity. Nose:  No deformity, discharge,  or lesions. Mouth:  No deformity or lesions.  Oropharynx pink & moist. Neck:  Supple; no masses or thyromegaly. Lungs:  Clear throughout to auscultation.   No wheezes, crackles, or rhonchi. No acute distress. Heart:  Regular rate and rhythm; no murmurs, clicks, rubs,  or gallops. Abdomen:  Soft, minimal epigastric tenderness, without rebound or guarding and nondistended. No masses, hepatosplenomegaly or hernias noted. Normal bowel sounds, without guarding, and without rebound.   Rectal:  Deferred    Msk:  Symmetrical without gross deformities. Normal posture. Pulses:  Normal pulses noted. Extremities:  Without clubbing or edema. Neurologic:  Alert and  oriented x4;  grossly normal  neurologically. Skin:  Intact without significant lesions or rashes. Cervical Nodes:  No significant cervical adenopathy. Psych:  Alert and cooperative. Normal mood and affect.  Intake/Output from previous day: 11/03 0701 - 11/04 0700 In: -  Out: 300 [Urine:300] Intake/Output this shift:    Lab Results:  Basename 03/22/11 0525 03/21/11 0905  WBC 11.6* 11.8*  HGB 14.0 15.2  HCT 41.8 44.5  PLT 122* 148*   BMET  Basename 03/22/11 0525 03/21/11 0905  NA 140 137  K 4.6 4.1  CL 109 101  CO2 25 28  GLUCOSE 94 167*  BUN 17 16  CREATININE 1.19 1.18  CALCIUM 9.9 10.8*   LFT  Basename 03/22/11 0525  PROT 5.2*  ALBUMIN 3.2*  AST 17  ALT 17  ALKPHOS 69  BILITOT 0.3  BILIDIR --  IBILI --    Studies/Results: Nm Hepatobiliary Liver Func  03/21/2011  *RADIOLOGY REPORT*  Clinical Data: Right upper quadrant pain  NUCLEAR MEDICINE HEPATOHBILIARY INCLUDE GB  Radiopharmaceutical: 5 mCi technetium 60m Choletec  Comparison: Ultrasound exam from 03/13/2011.  CT scan from 03/11/2011.  Findings: There is brisk uptake of radiotracer by the hepatic parenchyma, with clearance of blood flow activity by five - 10 minutes.  Biliary activity is seen at 20 minutes.  Gallbladder filling starts at 30 minutes, and gut activity is visible by 50 minutes.  IMPRESSION: Normal hepatobiliary patency scan.  Original Report Authenticated By: ERIC A. MANSELL, M.D.    Impression:  Refractory epigastric pain, belching, intermittent vomiting without a clear etiology. Consider gastroparesis, duodenal diverticulitis, partial obstruction.  Plan:  Plain abd films, add GI cocktail as needed and further plans per Drs. Loreta Ave and [employer].  Marvin Leon  03/22/2011, 10:41 AM

## 2011-03-22 NOTE — Progress Notes (Signed)
Subjective: Patient does state some mild epigastric abdominal pain. No nausea no vomiting. Tolerating clear liquids.  Objective: Vital signs in last 24 hours: Filed Vitals:   03/22/11 0300 03/22/11 0601 03/22/11 0937 03/22/11 1456  BP: 119/64 95/55 112/61 118/64  Pulse: 69 75 79 66  Temp: 97.7 F (36.5 C) 97.6 F (36.4 C) 98.7 F (37.1 C) 98.7 F (37.1 C)  TempSrc: Oral Oral Axillary Axillary  Resp: 16 18 18 18   Height:      Weight:      SpO2: 93% 95% 91% 94%    Intake/Output Summary (Last 24 hours) at 03/22/11 1627 Last data filed at 03/22/11 1500  Gross per 24 hour  Intake    480 ml  Output   1400 ml  Net   -920 ml    Weight change:   General: Alert, awake, oriented x3, in no acute distress. Heart: Regular rate and rhythm, without murmurs, rubs, gallops. Lungs: Clear to auscultation bilaterally. Abdomen: Soft, nontender, nondistended, positive bowel sounds. Extremities: No clubbing cyanosis or edema with positive pedal pulses. Neuro: Grossly intact, nonfocal.   Lab Results: Results for orders placed during the hospital encounter of 03/21/11 (from the past 48 hour(s))  LIPID PANEL     Status: Abnormal   Collection Time   03/21/11  8:00 AM      Component Value Range Comment   Cholesterol 76  0 - 200 (mg/dL)    Triglycerides 69  <409 (mg/dL)    HDL 22 (*) >81 (mg/dL)    Total CHOL/HDL Ratio 3.5      VLDL 14  0 - 40 (mg/dL)    LDL Cholesterol 40  0 - 99 (mg/dL)   HEMOGLOBIN X9J     Status: Abnormal   Collection Time   03/21/11  8:00 AM      Component Value Range Comment   Hemoglobin A1C 6.8 (*) <5.7 (%)    Mean Plasma Glucose 148 (*) <117 (mg/dL)   DIFFERENTIAL     Status: Abnormal   Collection Time   03/21/11  9:05 AM      Component Value Range Comment   Neutrophils Relative 72  43 - 77 (%)    Neutro Abs 8.5 (*) 1.7 - 7.7 (K/uL)    Lymphocytes Relative 15  12 - 46 (%)    Lymphs Abs 1.8  0.7 - 4.0 (K/uL)    Monocytes Relative 7  3 - 12 (%)    Monocytes  Absolute 0.8  0.1 - 1.0 (K/uL)    Eosinophils Relative 6 (*) 0 - 5 (%)    Eosinophils Absolute 0.7  0.0 - 0.7 (K/uL)    Basophils Relative 0  0 - 1 (%)    Basophils Absolute 0.0  0.0 - 0.1 (K/uL)   CBC     Status: Abnormal   Collection Time   03/21/11  9:05 AM      Component Value Range Comment   WBC 11.8 (*) 4.0 - 10.5 (K/uL)    RBC 4.83  4.22 - 5.81 (MIL/uL)    Hemoglobin 15.2  13.0 - 17.0 (g/dL)    HCT 47.8  29.5 - 62.1 (%)    MCV 92.1  78.0 - 100.0 (fL)    MCH 31.5  26.0 - 34.0 (pg)    MCHC 34.2  30.0 - 36.0 (g/dL)    RDW 30.8  65.7 - 84.6 (%)    Platelets 148 (*) 150 - 400 (K/uL)   COMPREHENSIVE METABOLIC PANEL  Status: Abnormal   Collection Time   03/21/11  9:05 AM      Component Value Range Comment   Sodium 137  135 - 145 (mEq/L)    Potassium 4.1  3.5 - 5.1 (mEq/L)    Chloride 101  96 - 112 (mEq/L)    CO2 28  19 - 32 (mEq/L)    Glucose, Bld 167 (*) 70 - 99 (mg/dL)    BUN 16  6 - 23 (mg/dL)    Creatinine, Ser 4.54  0.50 - 1.35 (mg/dL)    Calcium 09.8 (*) 8.4 - 10.5 (mg/dL)    Total Protein 6.6  6.0 - 8.3 (g/dL)    Albumin 3.9  3.5 - 5.2 (g/dL)    AST 19  0 - 37 (U/L)    ALT 21  0 - 53 (U/L)    Alkaline Phosphatase 80  39 - 117 (U/L)    Total Bilirubin 0.4  0.3 - 1.2 (mg/dL)    GFR calc non Af Amer 58 (*) >90 (mL/min)    GFR calc Af Amer 67 (*) >90 (mL/min)   LIPASE, BLOOD     Status: Normal   Collection Time   03/21/11  9:05 AM      Component Value Range Comment   Lipase 33  11 - 59 (U/L)   TROPONIN I     Status: Abnormal   Collection Time   03/21/11  4:39 PM      Component Value Range Comment   Troponin I 0.31 (*) <0.30 (ng/mL)   URINALYSIS, ROUTINE W REFLEX MICROSCOPIC     Status: Normal   Collection Time   03/21/11  5:29 PM      Component Value Range Comment   Color, Urine YELLOW  YELLOW     Appearance CLEAR  CLEAR     Specific Gravity, Urine 1.015  1.005 - 1.030     pH 6.0  5.0 - 8.0     Glucose, UA NEGATIVE  NEGATIVE (mg/dL)    Hgb urine dipstick  NEGATIVE  NEGATIVE     Bilirubin Urine NEGATIVE  NEGATIVE     Ketones, ur NEGATIVE  NEGATIVE (mg/dL)    Protein, ur NEGATIVE  NEGATIVE (mg/dL)    Urobilinogen, UA 0.2  0.0 - 1.0 (mg/dL)    Nitrite NEGATIVE  NEGATIVE     Leukocytes, UA NEGATIVE  NEGATIVE  MICROSCOPIC NOT DONE ON URINES WITH NEGATIVE PROTEIN, BLOOD, LEUKOCYTES, NITRITE, OR GLUCOSE <1000 mg/dL.  GLUCOSE, CAPILLARY     Status: Abnormal   Collection Time   03/21/11  6:16 PM      Component Value Range Comment   Glucose-Capillary 110 (*) 70 - 99 (mg/dL)    Comment 1 Documented in Chart      Comment 2 Notify RN     GLUCOSE, CAPILLARY     Status: Abnormal   Collection Time   03/21/11 11:02 PM      Component Value Range Comment   Glucose-Capillary 101 (*) 70 - 99 (mg/dL)    Comment 1 Notify RN     CBC     Status: Abnormal   Collection Time   03/22/11  5:25 AM      Component Value Range Comment   WBC 11.6 (*) 4.0 - 10.5 (K/uL)    RBC 4.48  4.22 - 5.81 (MIL/uL)    Hemoglobin 14.0  13.0 - 17.0 (g/dL)    HCT 11.9  14.7 - 82.9 (%)    MCV 93.3  78.0 -  100.0 (fL)    MCH 31.3  26.0 - 34.0 (pg)    MCHC 33.5  30.0 - 36.0 (g/dL)    RDW 21.3  08.6 - 57.8 (%)    Platelets 122 (*) 150 - 400 (K/uL)   DIFFERENTIAL     Status: Abnormal   Collection Time   03/22/11  5:25 AM      Component Value Range Comment   Neutrophils Relative 70  43 - 77 (%)    Neutro Abs 8.2 (*) 1.7 - 7.7 (K/uL)    Lymphocytes Relative 16  12 - 46 (%)    Lymphs Abs 1.9  0.7 - 4.0 (K/uL)    Monocytes Relative 6  3 - 12 (%)    Monocytes Absolute 0.7  0.1 - 1.0 (K/uL)    Eosinophils Relative 7 (*) 0 - 5 (%)    Eosinophils Absolute 0.9 (*) 0.0 - 0.7 (K/uL)    Basophils Relative 0  0 - 1 (%)    Basophils Absolute 0.0  0.0 - 0.1 (K/uL)   COMPREHENSIVE METABOLIC PANEL     Status: Abnormal   Collection Time   03/22/11  5:25 AM      Component Value Range Comment   Sodium 140  135 - 145 (mEq/L)    Potassium 4.6  3.5 - 5.1 (mEq/L)    Chloride 109  96 - 112 (mEq/L)     CO2 25  19 - 32 (mEq/L)    Glucose, Bld 94  70 - 99 (mg/dL)    BUN 17  6 - 23 (mg/dL)    Creatinine, Ser 4.69  0.50 - 1.35 (mg/dL)    Calcium 9.9  8.4 - 10.5 (mg/dL)    Total Protein 5.2 (*) 6.0 - 8.3 (g/dL)    Albumin 3.2 (*) 3.5 - 5.2 (g/dL)    AST 17  0 - 37 (U/L)    ALT 17  0 - 53 (U/L)    Alkaline Phosphatase 69  39 - 117 (U/L)    Total Bilirubin 0.3  0.3 - 1.2 (mg/dL)    GFR calc non Af Amer 58 (*) >90 (mL/min)    GFR calc Af Amer 67 (*) >90 (mL/min)   LIPASE, BLOOD     Status: Normal   Collection Time   03/22/11  5:25 AM      Component Value Range Comment   Lipase 21  11 - 59 (U/L)   CARDIAC PANEL(CRET KIN+CKTOT+MB+TROPI)     Status: Normal   Collection Time   03/22/11  5:25 AM      Component Value Range Comment   Total CK 40  7 - 232 (U/L)    CK, MB 3.3  0.3 - 4.0 (ng/mL)    Troponin I <0.30  <0.30 (ng/mL)    Relative Index RELATIVE INDEX IS INVALID  0.0 - 2.5    LIPID PANEL     Status: Abnormal   Collection Time   03/22/11  5:25 AM      Component Value Range Comment   Cholesterol 59  0 - 200 (mg/dL)    Triglycerides 41  <629 (mg/dL)    HDL 18 (*) >52 (mg/dL)    Total CHOL/HDL Ratio 3.3      VLDL 8  0 - 40 (mg/dL)    LDL Cholesterol 33  0 - 99 (mg/dL)     Micro: No results found for this or any previous visit (from the past 240 hour(s)).  Studies/Results: Nm Hepatobiliary Liver Func  03/21/2011  *  RADIOLOGY REPORT*  Clinical Data: Right upper quadrant pain  NUCLEAR MEDICINE HEPATOHBILIARY INCLUDE GB  Radiopharmaceutical: 5 mCi technetium 43m Choletec  Comparison: Ultrasound exam from 03/13/2011.  CT scan from 03/11/2011.  Findings: There is brisk uptake of radiotracer by the hepatic parenchyma, with clearance of blood flow activity by five - 10 minutes.  Biliary activity is seen at 20 minutes.  Gallbladder filling starts at 30 minutes, and gut activity is visible by 50 minutes.  IMPRESSION: Normal hepatobiliary patency scan.  Original Report Authenticated By: ERIC A.  MANSELL, M.D.   Dg Abd Acute W/chest  03/22/2011  *RADIOLOGY REPORT*  Clinical Data: .  Nausea vomiting.  ACUTE ABDOMEN SERIES (ABDOMEN 2 VIEW & CHEST 1 VIEW)  Comparison: CT from 03/11/2011  Findings: The lungs are clear without focal infiltrate, edema, pneumothorax or pleural effusion. Interstitial markings are diffusely coarsened with chronic features.  Nodular density over the posterior right fifth rib has imaging features compatible with a cardiac pad.  Right-sided decubitus film shows no intraperitoneal free air. Supine film shows no gaseous bowel dilatation to suggest obstruction.  IMPRESSION: No acute cardiopulmonary process.  No evidence for bowel obstruction or perforation.  Original Report Authenticated By: ERIC A. MANSELL, M.D.    Medications:     . ALPRAZolam  1 mg Oral BID  . aspirin EC  81 mg Oral Daily  . calcium-vitamin D  1 tablet Oral Daily  . clopidogrel  75 mg Oral Q breakfast  . doxazosin  8 mg Oral QHS  . ezetimibe-simvastatin  1 tablet Oral q1800  . insulin aspart  0-5 Units Subcutaneous QHS  . insulin aspart  0-9 Units Subcutaneous TID WC  . isosorbide mononitrate  15 mg Oral QHS  . metFORMIN  500 mg Oral QHS  . metoprolol succinate  50 mg Oral Daily  . multivitamins ther. w/minerals  1 tablet Oral Daily  . niacin  750 mg Oral QHS  . pantoprazole  40 mg Oral Q1200  . PARoxetine  20 mg Oral QHS    Assessment/Plan Principal Problem:  1.Abdominal pain/nausea/ vommiting- unknown etiology. Patient with recent  EGD during last hospitalization. HIDA scan unremarkable. Abdominal films unremarkable. Patient with diabetes and hx of CAD. Will cycle cardiac enzymes, check a gastric emptying study. GI ff and appreciate input and rxcs.  2.Nausea and vomiting in adult patient- see #1.  3.DM (diabetes mellitus) type II uncontrolled, periph vascular disorder- continue sliding scale insulin. D/C metformin. 4. HTN (hypertension)- stable. Continue Cardura  5.PVD (peripheral  vascular disease)- aspirin  6.Hyperlipemia- vytorin  7.CAD (coronary artery disease)- continue Plavix, ASA, Vytorin, imdur, toprol. . 8. BPH (benign prostatic hyperplasia)- cardura. 9.Prophylaxisi- Protonix for GI, Lovenox for DVT.      LOS: 1 day   [facility] 03/22/2011, 4:27 PM

## 2011-03-23 ENCOUNTER — Other Ambulatory Visit (HOSPITAL_COMMUNITY): Payer: PRIVATE HEALTH INSURANCE

## 2011-03-23 ENCOUNTER — Other Ambulatory Visit: Payer: Self-pay | Admitting: Gastroenterology

## 2011-03-23 ENCOUNTER — Inpatient Hospital Stay (HOSPITAL_COMMUNITY): Payer: PRIVATE HEALTH INSURANCE

## 2011-03-23 DIAGNOSIS — E1143 Type 2 diabetes mellitus with diabetic autonomic (poly)neuropathy: Secondary | ICD-10-CM | POA: Insufficient documentation

## 2011-03-23 DIAGNOSIS — K3184 Gastroparesis: Secondary | ICD-10-CM

## 2011-03-23 LAB — CBC
HCT: 37.6 % — ABNORMAL LOW (ref 39.0–52.0)
Hemoglobin: 12.3 g/dL — ABNORMAL LOW (ref 13.0–17.0)
MCH: 30.5 pg (ref 26.0–34.0)
MCV: 93.3 fL (ref 78.0–100.0)
RBC: 4.03 MIL/uL — ABNORMAL LOW (ref 4.22–5.81)

## 2011-03-23 LAB — CARDIAC PANEL(CRET KIN+CKTOT+MB+TROPI)
CK, MB: 3.1 ng/mL (ref 0.3–4.0)
Relative Index: INVALID (ref 0.0–2.5)
Total CK: 56 U/L (ref 7–232)
Troponin I: <0.3 ng/mL (ref <0.30)

## 2011-03-23 LAB — BASIC METABOLIC PANEL
BUN: 17 mg/dL (ref 6–23)
CO2: 23 mEq/L (ref 19–32)
Glucose, Bld: 126 mg/dL — ABNORMAL HIGH (ref 70–99)
Potassium: 3.8 mEq/L (ref 3.5–5.1)
Sodium: 139 mEq/L (ref 135–145)

## 2011-03-23 LAB — GLUCOSE, CAPILLARY
Glucose-Capillary: 134 mg/dL — ABNORMAL HIGH (ref 70–99)
Glucose-Capillary: 140 mg/dL — ABNORMAL HIGH (ref 70–99)

## 2011-03-23 MED ORDER — GADOBENATE DIMEGLUMINE 529 MG/ML IV SOLN
17.0000 mL | Freq: Once | INTRAVENOUS | Status: AC | PRN
  Administered 2011-03-23: 17 mL via INTRAVENOUS

## 2011-03-23 MED ORDER — ALPRAZOLAM 1 MG PO TABS: 1.0000 mg | ORAL_TABLET | Freq: Two times a day (BID) | ORAL | Status: DC

## 2011-03-23 MED ORDER — METOCLOPRAMIDE HCL 5 MG PO TABS
5.0000 mg | ORAL_TABLET | Freq: Three times a day (TID) | ORAL | Status: DC
  Administered 2011-03-23 – 2011-03-26 (×12): 5 mg via ORAL
  Filled 2011-03-23 (×19): qty 1

## 2011-03-23 MED ORDER — TECHNETIUM TC 99M SULFUR COLLOID
2.2000 | Freq: Once | INTRAVENOUS | Status: AC | PRN
  Administered 2011-03-23: 2.2 via INTRAVENOUS

## 2011-03-23 NOTE — Progress Notes (Signed)
[id number]/[name]/Utilization review of Chart performed. 

## 2011-03-23 NOTE — Progress Notes (Signed)
Subjective: Marvin Leon is feeling poorly. He continues to complain of epigastric pain that is intermittent. He does not have any pain at present. He also reports intermittent nausea but no frank vomiting. He had his gastric emptying study done this morning but the results are not yet back. The patient cannot specifically say as to whether or not his symptoms are exacerbated by eating. He does complain of a sore throat. He does complain of reflux type symptoms. Objective:  Vital signs in last 24 hours:  Filed Vitals:   03/22/11 2107 03/23/11 0217 03/23/11 0618 03/23/11 1345  BP: 174/74 139/68 126/70 156/59  Pulse: 66 68 70 63  Temp: 98.1 F (36.7 C) 96.3 F (35.7 C) 97.5 F (36.4 C) 98.2 F (36.8 C)  TempSrc: Axillary Axillary Axillary Oral  Resp: 20 18 17 18   Height:      Weight:      SpO2: 92% 91% 93% 95%    Intake/Output from previous day:   Intake/Output Summary (Last 24 hours) at 03/23/11 1429 Last data filed at 03/23/11 1300  Gross per 24 hour  Intake   1890 ml  Output   1600 ml  Net    290 ml    Physical Exam: General Appearance:    Alert, cooperative, no distress, appears stated age  Lungs:     Clear to auscultation bilaterally, respirations unlabored   Heart:    Regular rate and rhythm, S1 and S2 normal, no murmur, rub   or gallop  Abdomen:     Soft, mildly tender in the midepigastric area, bowel sounds active all four quadrants,    no masses, no organomegaly  Extremities:   Extremities normal, atraumatic, no cyanosis or edema  Pulses:   1+ and symmetric all extremities    Lab Results:  Basic Metabolic Panel:    Component Value Date/Time   NA 139 03/23/2011 0810   K 3.8 03/23/2011 0810   CL 107 03/23/2011 0810   CO2 23 03/23/2011 0810   BUN 17 03/23/2011 0810   CREATININE 1.16 03/23/2011 0810   GLUCOSE 126* 03/23/2011 0810   CALCIUM 9.5 03/23/2011 0810   CBC:    Component Value Date/Time   WBC 6.8 03/23/2011 0810   HGB 12.3* 03/23/2011 0810   HCT 37.6*  03/23/2011 0810   PLT 122* 03/23/2011 0810   MCV 93.3 03/23/2011 0810   NEUTROABS 8.2* 03/22/2011 0525   LYMPHSABS 1.9 03/22/2011 0525   MONOABS 0.7 03/22/2011 0525   EOSABS 0.9* 03/22/2011 0525   BASOSABS 0.0 03/22/2011 0525    No results found for this or any previous visit (from the past 240 hour(s)).  Studies/Results: Nm Hepatobiliary Liver Func  03/21/2011  *RADIOLOGY REPORT*  Clinical Data: Right upper quadrant pain  NUCLEAR MEDICINE HEPATOHBILIARY INCLUDE GB  Radiopharmaceutical: 5 mCi technetium 56m Choletec  Comparison: Ultrasound exam from 03/13/2011.  CT scan from 03/11/2011.  Findings: There is brisk uptake of radiotracer by the hepatic parenchyma, with clearance of blood flow activity by five - 10 minutes.  Biliary activity is seen at 20 minutes.  Gallbladder filling starts at 30 minutes, and gut activity is visible by 50 minutes.  IMPRESSION: Normal hepatobiliary patency scan.  Original Report Authenticated By: ERIC A. MANSELL, M.D.   Nm Gastric Emptying  03/23/2011  *RADIOLOGY REPORT*  Clinical data: Abdominal pain, nausea, vomiting  NUCLEAR MEDICINE GASTRIC EMPTYING EXAM:  Radiopharmaceutical:  2.2 mCi Tc-70m sulfur colloid labeled egg whites  Technique: The patient ingested a standardized meal containing radiolabeled  egg whites. Imaging was performed in the anterior projection for 120 minutes. Gastric emptying is calculated from the obtained images.  Findings: Subjectively mildly decreased emptying of tracer is seen from the stomach with abnormal retained gastric tracer at 2 hours. Quantitative analysis reveals 25% emptying at 1 hour and 63% at 2 hours.  Findings represent mildly delayed gastric emptying, with normal patients demonstrating greater than 70% emptying at 2 hours.  IMPRESSION: Mildly delayed gastric emptying.  Original Report Authenticated By: Lollie Marrow, M.D.   Dg Abd Acute W/chest  03/22/2011  *RADIOLOGY REPORT*  Clinical Data: .  Nausea vomiting.  ACUTE ABDOMEN SERIES  (ABDOMEN 2 VIEW & CHEST 1 VIEW)  Comparison: CT from 03/11/2011  Findings: The lungs are clear without focal infiltrate, edema, pneumothorax or pleural effusion. Interstitial markings are diffusely coarsened with chronic features.  Nodular density over the posterior right fifth rib has imaging features compatible with a cardiac pad.  Right-sided decubitus film shows no intraperitoneal free air. Supine film shows no gaseous bowel dilatation to suggest obstruction.  IMPRESSION: No acute cardiopulmonary process.  No evidence for bowel obstruction or perforation.  Original Report Authenticated By: ERIC A. MANSELL, M.D.    Medications: Scheduled Meds:   . ALPRAZolam  1 mg Oral BID  . aspirin EC  81 mg Oral Daily  . calcium-vitamin D  1 tablet Oral Daily  . clopidogrel  75 mg Oral Q breakfast  . doxazosin  8 mg Oral QHS  . enoxaparin (LOVENOX) injection  40 mg Subcutaneous Q24H  . ezetimibe-simvastatin  1 tablet Oral q1800  . insulin aspart  0-5 Units Subcutaneous QHS  . insulin aspart  0-9 Units Subcutaneous TID WC  . isosorbide mononitrate  15 mg Oral QHS  . metoprolol succinate  50 mg Oral Daily  . multivitamins ther. w/minerals  1 tablet Oral Daily  . niacin  750 mg Oral QHS  . pantoprazole  40 mg Oral Q1200  . PARoxetine  20 mg Oral QHS  . DISCONTD: metFORMIN  500 mg Oral QHS   Continuous Infusions:   . sodium chloride 75 mL/hr at 03/22/11 0911   PRN Meds:.acetaminophen, gi cocktail, morphine injection, ondansetron (ZOFRAN) IV, ondansetron, oxyCODONE, phenol, technetium sulfur colloid, traZODone  Assessment/Plan:  Principal Problem:  *Abdominal pain: The patient's pain seems to be out of proportion to objective physical exam findings and the fact that his diagnostic tests have all been unrevealing also is concerning for possible mesenteric ischemia. Will order a mesenteric MRA to further evaluate mesenteric ischemia as a possible explanation for his abdominal pain. Active Problems:   Nausea and vomiting in adult patient: The patient's nausea is persistent but the vomiting has resolved. We'll treat this with anti-medics and given the fact that his gastric emptying study did show some mild gastroparesis, we'll start him on Reglan q. a.c.  DM (diabetes mellitus) type II uncontrolled: The patient CBG range has been from 97-140 over the past 24 hours. We will continue him on sliding scale insulin as this appears to be controlling his insulin controlling his sugars well at this point.  CAD (coronary artery disease): The patient's cardiac markers have been negative. He is currently on antiplatelet therapy and has been followed closely by his cardiologist as an outpatient. He had a cardiac catheterization done 12/2008 which showed patent stents.  Gastroparesis due to DM: We will start the patient on low-dose Reglan therapy to see if this improves his symptoms.  HTN (hypertension): We will continue to monitor his  blood pressure and adjust his medications if needed.  PVD (peripheral vascular disease): We will continue his antiplatelet therapy.  Hyperlipemia: Continue Vytorin.  BPH (benign prostatic hyperplasia): Continue Doxazosin.   LOS: 2 days   RAMA,CHRISTINA 03/23/2011, 2:29 PM

## 2011-03-23 NOTE — Progress Notes (Signed)
Patient has had a gastric emptying done today which revealed 70% retention in 2 hours. The MRA following the gastric emptying study revealed no critical stenosis. Patient has ongoing complaints of nausea with intermittent vomiting [no vomiting since admission] along with epigastric pain, off and on. Patient has not had a BM since Saturday except for a small stool this AM. He has poor appetite over the last couple of days and may have lost a few pounds.     Vital signs are stable. HEENT : ATNC PERRLA Facial symmetry preserved. Neck : Supple Chest: CTA,  S1 and S2 regular.  Abdomen: Obese, NT NABS.   Labs and X-rays reviewed. A/P 1) Diabetic gastroparesis; Dennie Bible has been started on Reglan; plans are to monitor his response to the prokinetcs. Risks and benefits of Reglan discussed with the patient and his family. I spent 40 minutes with his wife and daughters, discussing his medical issues. 2) Chronic constipation: Hopefully, this will improve with the Reglan.  3) GERD: On PPI's. D/C Sucralfate. 4) HTN/CAD on Metoprolol and Cardura.  5) Anxiety disorder: On Xanax. 6) PVD on Aspirin and Plavix.  7) BPH.

## 2011-03-23 NOTE — Progress Notes (Deleted)
Patient has had a gastric emptying done today which revealed 70% retention in 2 hours. The MRA following the gastric emptying study revealed no critical stenosis. Patient has ongoing complaints of nausea with intermittent vomiting [no vomiting since admission] along with epigastric pain, off and on. Patient has not had a BM since Saturday except for a small stool this AM. Marvin Leon has poor appetite over the last couple of days and may have lost a few pounds.     Vital signs are stable.  HEENT : ATNC PERRLA Facial symmetry preserved. Neck : Supple Chest: CTA,  S1 and S2 regular.  Abdomen: Obese, NT NABS.   Labs and X-rays reviewed.

## 2011-03-24 DIAGNOSIS — K219 Gastro-esophageal reflux disease without esophagitis: Secondary | ICD-10-CM | POA: Diagnosis present

## 2011-03-24 DIAGNOSIS — K5900 Constipation, unspecified: Secondary | ICD-10-CM | POA: Diagnosis present

## 2011-03-24 LAB — GLUCOSE, CAPILLARY
Glucose-Capillary: 117 mg/dL — ABNORMAL HIGH (ref 70–99)
Glucose-Capillary: 153 mg/dL — ABNORMAL HIGH (ref 70–99)
Glucose-Capillary: 113 mg/dL — ABNORMAL HIGH (ref 70–99)
Glucose-Capillary: 113 mg/dL — ABNORMAL HIGH (ref 70–99)
Glucose-Capillary: 134 mg/dL — ABNORMAL HIGH (ref 70–99)

## 2011-03-24 MED ORDER — POLYETHYLENE GLYCOL 3350 17 G PO PACK
17.0000 g | PACK | Freq: Every day | ORAL | Status: DC
  Administered 2011-03-24: 13:00:00 via ORAL
  Administered 2011-03-25 – 2011-03-26 (×2): 17 g via ORAL
  Filled 2011-03-24 (×4): qty 1

## 2011-03-24 MED ORDER — ALPRAZOLAM 1 MG PO TABS
1.0000 mg | ORAL_TABLET | Freq: Two times a day (BID) | ORAL | Status: DC
  Administered 2011-03-24 – 2011-03-26 (×5): 1 mg via ORAL
  Filled 2011-03-24 (×5): qty 1

## 2011-03-24 NOTE — Plan of Care (Signed)
GAVE INSULIN 2 UNITS ON THIS PT. WAS NOT SUPPOSE TO GIVE ANY INSULIN FOR BS.  DR. RAMA AWARE, NEW ORDERS RECEIVED TO RECHECK BS IN 30 MINUTES.   WILL GIVE A SNACK TO PT.

## 2011-03-24 NOTE — Progress Notes (Signed)
Subjective: Marvin Leon denies any abdominal pain today. He reports that his bowels moved but that his bowel movement was hard like "cement". His diet has not yet been advanced but is requesting advancement which is a good sign. Objective:  Vital signs in last 24 hours:  Filed Vitals:   03/23/11 1700 03/23/11 2124 03/24/11 0231 03/24/11 0509  BP: 174/72 171/72 144/65 138/59  Pulse: 65 59 75 69  Temp: 98 F (36.7 C) 97.8 F (36.6 C) 98.2 F (36.8 C) 98.2 F (36.8 C)  TempSrc: Oral Oral Oral Oral  Resp: 17 18 18 18   Height:      Weight:      SpO2: 94% 94% 95% 95%    Intake/Output from previous day:   Intake/Output Summary (Last 24 hours) at 03/24/11 0957 Last data filed at 03/24/11 4540  Gross per 24 hour  Intake   1065 ml  Output   2500 ml  Net  -1435 ml    Physical Exam: General Appearance:    Alert, cooperative, no distress, appears stated age  Lungs:     Clear to auscultation bilaterally, respirations unlabored   Heart:    Regular rate and rhythm, S1 and S2 normal, no murmur, rub   or gallop  Abdomen:     Soft, non-tender, bowel sounds active all four quadrants,    no masses, no organomegaly  Extremities:   Extremities normal, atraumatic, no cyanosis, 1+ edema  Pulses:   2+ and symmetric all extremities    Lab Results:  Basic Metabolic Panel:    Component Value Date/Time   NA 139 03/23/2011 0810   K 3.8 03/23/2011 0810   CL 107 03/23/2011 0810   CO2 23 03/23/2011 0810   BUN 17 03/23/2011 0810   CREATININE 1.16 03/23/2011 0810   GLUCOSE 126* 03/23/2011 0810   CALCIUM 9.5 03/23/2011 0810   CBC:    Component Value Date/Time   WBC 6.8 03/23/2011 0810   HGB 12.3* 03/23/2011 0810   HCT 37.6* 03/23/2011 0810   PLT 122* 03/23/2011 0810   MCV 93.3 03/23/2011 0810   NEUTROABS 8.2* 03/22/2011 0525   LYMPHSABS 1.9 03/22/2011 0525   MONOABS 0.7 03/22/2011 0525   EOSABS 0.9* 03/22/2011 0525   BASOSABS 0.0 03/22/2011 0525     Studies/Results: Nm Gastric Emptying  03/23/2011   *RADIOLOGY REPORT*  Clinical data: Abdominal pain, nausea, vomiting  NUCLEAR MEDICINE GASTRIC EMPTYING EXAM:  Radiopharmaceutical:  2.2 mCi Tc-38m sulfur colloid labeled egg whites  Technique: The patient ingested a standardized meal containing radiolabeled egg whites. Imaging was performed in the anterior projection for 120 minutes. Gastric emptying is calculated from the obtained images.  Findings: Subjectively mildly decreased emptying of tracer is seen from the stomach with abnormal retained gastric tracer at 2 hours. Quantitative analysis reveals 25% emptying at 1 hour and 63% at 2 hours.  Findings represent mildly delayed gastric emptying, with normal patients demonstrating greater than 70% emptying at 2 hours.  IMPRESSION: Mildly delayed gastric emptying.  Original Report Authenticated By: Lollie Marrow, M.D.   Marvin Leon Abdomen W Wo Contrast  03/23/2011  *RADIOLOGY REPORT*  Clinical Data:  Abdominal pain, nausea and vomiting.  History of vascular disease.  MRA ABDOMEN WITH AND WITHOUT CONTRAST  Technique:  Angiographic images of the abdomen were obtained using MRA technique with intravenous contrast.  Contrast:  17mL MULTIHANCE GADOBENATE DIMEGLUMINE 529 MG/ML IV SOLN  Comparison:  CT of the abdomen dated 03/11/2011.  Findings:  The abdominal aorta and  its branches are well visualized.  Proximal celiac axis shows irregular narrowing approaching 50% in caliber.  Superior mesenteric and inferior mesenteric arteries are normally patent.  Small left renal artery does not show focal stenosis.  Right kidney is supplied by either two arteries originating nearly in identical locations off of the aorta or a single artery demonstrating bifurcation at its origin. No right renal artery stenosis.  Abdominal aorta demonstrates scattered atherosclerotic disease without aneurysmal dilatation or stenosis.  There likely is at least a moderate stenosis at the origin of the right common iliac artery.  IMPRESSION:  1.  No  evidence of significant mesenteric arterial occlusive disease.  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 Other mesenteric arteries show normal patency. 2.  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  Original Report Authenticated By: Reola Calkins, M.D.   Dg Abd Acute W/chest  03/22/2011  *RADIOLOGY REPORT*  Clinical Data: .  Nausea vomiting.  ACUTE ABDOMEN SERIES (ABDOMEN 2 VIEW & CHEST 1 VIEW)  Comparison: CT from 03/11/2011  Findings: The lungs are clear without focal infiltrate, edema, pneumothorax or pleural effusion. Interstitial markings are diffusely coarsened with chronic features.  Nodular density over the posterior right fifth rib has imaging features compatible with a cardiac pad.  Right-sided decubitus film shows no intraperitoneal free air. Supine film shows no gaseous bowel dilatation to suggest obstruction.  IMPRESSION: No acute cardiopulmonary process.  No evidence for bowel obstruction or perforation.  Original Report Authenticated By: ERIC A. MANSELL, M.D.    Medications: Scheduled Meds:   . ALPRAZolam  1 mg Oral BID  . aspirin EC  81 mg Oral Daily  . calcium-vitamin D  1 tablet Oral Daily  . clopidogrel  75 mg Oral Q breakfast  . doxazosin  8 mg Oral QHS  . enoxaparin (LOVENOX) injection  40 mg Subcutaneous Q24H  . ezetimibe-simvastatin  1 tablet Oral q1800  . insulin aspart  0-5 Units Subcutaneous QHS  . insulin aspart  0-9 Units Subcutaneous TID WC  . isosorbide mononitrate  15 mg Oral QHS  . metoCLOPramide  5 mg Oral TID AC & HS  . metoprolol succinate  50 mg Oral Daily  . multivitamins ther. w/minerals  1 tablet Oral Daily  . niacin  750 mg Oral QHS  . pantoprazole  40 mg Oral Q1200  . PARoxetine  20 mg Oral QHS  . DISCONTD: ALPRAZolam  1 mg Oral BID  . DISCONTD: ALPRAZolam  1 mg Oral BID   Continuous Infusions:   . sodium chloride 75 mL/hr at 03/23/11 2300   PRN Meds:.acetaminophen, gadobenate dimeglumine, gi  cocktail, morphine injection, ondansetron (ZOFRAN) IV, ondansetron, oxyCODONE, phenol, technetium sulfur colloid, traZODone  Assessment/Plan:  Principal Problem:  *Abdominal pain: The patient has not had any significant abdominal pain for the past 48 hours. His gastric emptying study revealed some mild gastroparesis. His Marvin Leon did not show any critical stenosis. At this point, his abdominal pain is most likely a combination of mild gastroparesis, reflux, and constipation. We will maximize treatment for these 3 entities and observe him further. Active Problems:  Nausea and vomiting in adult patient: The patient has not had any vomiting.  We will advance his diet to a carbohydrate modified diet.  DM (diabetes mellitus) type II uncontrolled: The patient's blood glucose checks have ranged from 117-152 indicating adequate control. Periphal vascular disorder: We will continue his antiplatelet therapy. The MRA did not reveal any critical stenosis.  CAD (coronary artery disease): The patient's cardiac markers have been negative. He is currently on antiplatelet therapy and has been followed closely by his cardiologist as an outpatient. He had a cardiac catheterization done 12/2008 which showed patent stents. Can followup with his cardiologist as an outpatient.  Gastroparesis due to DM: The patient was started on Reglan which we will continue. This can be titrated up to affect, if needed.  HTN (hypertension): Patient's blood pressure has improved over the past 24 hours. We will discontinue his IV fluids and continue to monitor his blood pressure closely over the next 24 hours.  GERD (gastroesophageal reflux disease): Continue PPI therapy. Carafate has been discontinued by the gastroenterologists.  Constipation: We'll place him on a bowel regimen for better control of his constipation. Hopefully Reglan will help as well.  Hyperlipemia: Continue Vytorin.   BPH (benign prostatic hyperplasia): Continue  Doxazosin.      LOS: 3 days   Maleny Candy 03/24/2011, 9:57 AM

## 2011-03-24 NOTE — Plan of Care (Signed)
PLEASE GIVE PT. EDUCATION FOR GASTROPORESIS, PER DR. MANN

## 2011-03-25 LAB — GLUCOSE, CAPILLARY
Glucose-Capillary: 136 mg/dL — ABNORMAL HIGH (ref 70–99)
Glucose-Capillary: 121 mg/dL — ABNORMAL HIGH (ref 70–99)
Glucose-Capillary: 172 mg/dL — ABNORMAL HIGH (ref 70–99)

## 2011-03-25 MED ORDER — SODIUM CHLORIDE 0.9 % IJ SOLN
3.0000 mL | Freq: Two times a day (BID) | INTRAMUSCULAR | Status: DC
  Administered 2011-03-25 – 2011-03-26 (×3): 3 mL via INTRAVENOUS

## 2011-03-25 MED ORDER — GUAIFENESIN-DM 100-10 MG/5ML PO SYRP
5.0000 mL | ORAL_SOLUTION | ORAL | Status: DC | PRN
  Administered 2011-03-25 – 2011-03-26 (×3): 5 mL via ORAL
  Filled 2011-03-25 (×3): qty 10

## 2011-03-25 NOTE — Progress Notes (Signed)
Subjective: Mr. Caba denies any abdominal pain again today. He reports that his bowels have moved twice over the past 24 hours. He is anxious to discharge home, his daughter is reluctant for him to be sent home prematurely. She requests an inpatient physical therapy evaluation and consultation with a dietitian for further dietary instruction regarding his gastroparesis.  Objective:  Vital signs in last 24 hours:  Filed Vitals:   03/25/11 0228 03/25/11 0544 03/25/11 0846 03/25/11 0900  BP: 111/56 126/66 143/69   Pulse: 68 84 82 62  Temp: 98.1 F (36.7 C) 98 F (36.7 C)    TempSrc: Oral Axillary    Resp: 20 20    Height:      Weight:      SpO2: 94% 93%  93%    Intake/Output from previous day:   Intake/Output Summary (Last 24 hours) at 03/25/11 1350 Last data filed at 03/25/11 0900  Gross per 24 hour  Intake    580 ml  Output   1075 ml  Net   -495 ml    Physical Exam: General Appearance:    Alert, cooperative, no distress, appears stated age  Lungs:     Clear to auscultation bilaterally, respirations unlabored   Heart:    Regular rate and rhythm, S1 and S2 normal, no murmur, rub   or gallop  Abdomen:     Soft, non-tender, bowel sounds active all four quadrants,    no masses, no organomegaly  Extremities:   Extremities normal, atraumatic, no cyanosis, 1+ edema  Pulses:   2+ and symmetric all extremities    Lab Results:  Basic Metabolic Panel:    Component Value Date/Time   NA 139 03/23/2011 0810   K 3.8 03/23/2011 0810   CL 107 03/23/2011 0810   CO2 23 03/23/2011 0810   BUN 17 03/23/2011 0810   CREATININE 1.16 03/23/2011 0810   GLUCOSE 126* 03/23/2011 0810   CALCIUM 9.5 03/23/2011 0810   CBC:    Component Value Date/Time   WBC 6.8 03/23/2011 0810   HGB 12.3* 03/23/2011 0810   HCT 37.6* 03/23/2011 0810   PLT 122* 03/23/2011 0810   MCV 93.3 03/23/2011 0810   NEUTROABS 8.2* 03/22/2011 0525   LYMPHSABS 1.9 03/22/2011 0525   MONOABS 0.7 03/22/2011 0525   EOSABS 0.9*  03/22/2011 0525   BASOSABS 0.0 03/22/2011 0525     Studies/Results: Nm Gastric Emptying  03/23/2011  *RADIOLOGY REPORT*  Clinical data: Abdominal pain, nausea, vomiting  NUCLEAR MEDICINE GASTRIC EMPTYING EXAM:  Radiopharmaceutical:  2.2 mCi Tc-89m sulfur colloid labeled egg whites  Technique: The patient ingested a standardized meal containing radiolabeled egg whites. Imaging was performed in the anterior projection for 120 minutes. Gastric emptying is calculated from the obtained images.  Findings: Subjectively mildly decreased emptying of tracer is seen from the stomach with abnormal retained gastric tracer at 2 hours. Quantitative analysis reveals 25% emptying at 1 hour and 63% at 2 hours.  Findings represent mildly delayed gastric emptying, with normal patients demonstrating greater than 70% emptying at 2 hours.  IMPRESSION: Mildly delayed gastric emptying.  Original Report Authenticated By: Lollie Marrow, M.D.   Mr Angiogram Abdomen W Wo Contrast  03/23/2011  *RADIOLOGY REPORT*  Clinical Data:  Abdominal pain, nausea and vomiting.  History of vascular disease.  MRA ABDOMEN WITH AND WITHOUT CONTRAST  Technique:  Angiographic images of the abdomen were obtained using MRA technique with intravenous contrast.  Contrast:  17mL MULTIHANCE GADOBENATE DIMEGLUMINE 529 MG/ML IV SOLN  Comparison:  CT of the abdomen dated 03/11/2011.  Findings:  The abdominal aorta and its branches are well visualized.  Proximal celiac axis shows irregular narrowing approaching 50% in caliber.  Superior mesenteric and inferior mesenteric arteries are normally patent.  Small left renal artery does not show focal stenosis.  Right kidney is supplied by either two arteries originating nearly in identical locations off of the aorta or a single artery demonstrating bifurcation at its origin. No right renal artery stenosis.  Abdominal aorta demonstrates scattered atherosclerotic disease without aneurysmal dilatation or stenosis.  There  likely is at least a moderate stenosis at the origin of the right common iliac artery.  IMPRESSION:  1.  No evidence of significant mesenteric arterial occlusive disease.  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 Other mesenteric arteries show normal patency. 2.  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  Original Report Authenticated By: Reola Calkins, M.D.    Medications: Scheduled Meds:    . ALPRAZolam  1 mg Oral BID  . aspirin EC  81 mg Oral Daily  . calcium-vitamin D  1 tablet Oral Daily  . clopidogrel  75 mg Oral Q breakfast  . doxazosin  8 mg Oral QHS  . enoxaparin (LOVENOX) injection  40 mg Subcutaneous Q24H  . ezetimibe-simvastatin  1 tablet Oral q1800  . insulin aspart  0-5 Units Subcutaneous QHS  . insulin aspart  0-9 Units Subcutaneous TID WC  . isosorbide mononitrate  15 mg Oral QHS  . metoCLOPramide  5 mg Oral TID AC & HS  . metoprolol succinate  50 mg Oral Daily  . multivitamins ther. w/minerals  1 tablet Oral Daily  . niacin  750 mg Oral QHS  . pantoprazole  40 mg Oral Q1200  . PARoxetine  20 mg Oral QHS  . polyethylene glycol  17 g Oral Daily   Continuous Infusions:  PRN Meds:.acetaminophen, gi cocktail, guaiFENesin-dextromethorphan, morphine injection, ondansetron (ZOFRAN) IV, ondansetron, oxyCODONE, phenol, traZODone  Assessment/Plan:  Principal Problem:  *Abdominal pain: The patient has not had any significant abdominal pain for the past 72 hours. His gastric emptying study revealed some mild gastroparesis. His MR angiogram did not show any critical stenosis. At this point, his abdominal pain is most likely a combination of mild gastroparesis, reflux, and constipation. We have maximized treatment for these 3 entities and he is eating without difficulty and has had 2 bowel movements over the past 24 hours. Active Problems:  Nausea and vomiting in adult patient: The patient has not had any vomiting.  He is tolerating a  carbohydrate modified diet. We'll get the dietitian to instruct him regarding small, frequent meals given his gastroparesis.  DM (diabetes mellitus) type II uncontrolled: The patient's blood glucose checks have ranged from 113-136 indicating adequate control. Periphal vascular disorder: We will continue his antiplatelet therapy. The MRA did not reveal any critical stenosis.  CAD (coronary artery disease): The patient's cardiac markers have been negative. He is currently on antiplatelet therapy and has been followed closely by his cardiologist as an outpatient. He had a cardiac catheterization done 12/2008 which showed patent stents. Can followup with his cardiologist as an outpatient.  Gastroparesis due to DM: The patient was started on Reglan which we will continue. This can be titrated up to affect, if needed.  He dietitian consultation has been requested for diet teaching.  HTN (hypertension): Patient's blood pressure has improved over the past 24 hours. We have discontinued his IV  fluids and his systolic blood pressures have been stable in the 140s. His diastolic blood pressures have been in the 60s. GERD (gastroesophageal reflux disease): Continue PPI therapy. Carafate has been discontinued by the gastroenterologists.  Constipation: We have placed him on a bowel regimen for better control of his constipation. Hopefully Reglan will help as well.  Hyperlipemia: Continue Vytorin.   BPH (benign prostatic hyperplasia): Continue Doxazosin.      LOS: 4 days   Nissan Frazzini 03/25/2011, 1:50 PM

## 2011-03-26 MED ORDER — POLYETHYLENE GLYCOL 3350 17 G PO PACK: 17.0000 g | PACK | Freq: Every day | ORAL | Status: AC

## 2011-03-26 MED ORDER — METOCLOPRAMIDE HCL 5 MG PO TABS: 5.0000 mg | ORAL_TABLET | Freq: Three times a day (TID) | ORAL | Status: DC

## 2011-03-26 NOTE — Progress Notes (Signed)
Physical Therapy Evaluation Patient Details Name: Marvin Leon MRN: [id number] DOB: 1933-04-04 Today's Date: 03/26/2011 Time: 1010-1031  Tier II Eval Problem List:  Patient Active Problem List  Diagnoses  . Abdominal pain  . Nausea and vomiting in adult patient  . DM (diabetes mellitus) type II uncontrolled, periph vascular disorder  . HTN (hypertension)  . PVD (peripheral vascular disease)  . Hyperlipemia  . CAD (coronary artery disease)  . BPH (benign prostatic hyperplasia)  . Gastroparesis due to DM  . Diabetic gastroparesis  . GERD (gastroesophageal reflux disease)  . Constipation    Past Medical History: No past medical history on file. Past Surgical History: No past surgical history on file.  PT Assessment/Plan/Recommendation PT Assessment Clinical Impression Statement: Pt presents with a medical diagnosis of abdominal pain. Pt will benefit from skilled PT services in the acute care setting to improve gait and balance in order to maximize independence and safety with functional mobility. PT Recommendation/Assessment: Patient will need skilled PT in the acute care venue PT Problem List: Decreased balance;Decreased knowledge of use of DME Problem List Comments: Pt would benefit from use of assistive device to improve safety with ambulation. Rollator may be appropriate. PT Therapy Diagnosis : Difficulty walking PT Plan PT Frequency: Min 3X/week PT Treatment/Interventions: Balance training;Gait training;DME instruction PT Recommendation Follow Up Recommendations: Home health PT for balance training program. (OP PT balance program may be most beneficial, if able.) Equipment Recommended:  (Still assessing. Pt does have access to equipment.) PT Goals  Acute Rehab PT Goals PT Goal Formulation: With patient Time For Goal Achievement: 7 days Pt will Ambulate: >150 feet;with modified independence;with least restrictive assistive device Pt will Perform Home Exercise Program: with  supervision, verbal cues required/provided Additional Goals Additional Goal #1: Pt will perform static and dynamic standing balance activities with supervision, without LOB.  PT Evaluation Precautions/Restrictions  Precautions Precautions: Fall Prior Functioning  Home Living Lives With: Spouse Receives Help From: Family Type of Home: House Home Access: Ramped entrance Home Adaptive Equipment: Straight cane;Grab bars in shower (Pt has access to rollator, WC.) Prior Function Level of Independence: Independent with basic ADLs Driving: Yes Cognition Cognition Arousal/Alertness: Awake/alert Overall Cognitive Status: Appears within functional limits for tasks assessed Sensation/Coordination   Extremity Assessment RLE Assessment RLE Assessment: Within Functional Limits LLE Assessment LLE Assessment: Within Functional Limits Mobility (including Balance) Transfers Transfers: Yes Sit to Stand: 6: Modified independent (Device/Increase time) Stand to Sit: 6: Modified independent (Device/Increase time) Ambulation/Gait Ambulation/Gait: Yes Ambulation/Gait Assistance Details (indicate cue type and reason): Min-guard assist. some instability noted - stumbles, wavering. NO LOB. Ambulation Distance (Feet): 400 Feet Assistive device: None Gait Pattern: Decreased trunk rotation;Decreased stride length  Balance Balance Assessed: Yes Berg Balance Test Sit to Stand: Able to stand  independently using hands Standing Unsupported: Able to stand safely 2 minutes Sitting with Back Unsupported but Feet Supported on Floor or Stool: Able to sit safely and securely 2 minutes Stand to Sit: Controls descent by using hands Transfers: Able to transfer safely, minor use of hands Standing Unsupported with Eyes Closed: Able to stand 10 seconds with supervision Standing Ubsupported with Feet Together: Needs help to attain position but able to stand for 30 seconds with feet together From Standing, Reach  Forward with Outstretched Arm: Can reach forward >5 cm safely (2") From Standing Position, Pick up Object from Floor: Able to pick up shoe safely and easily From Standing Position, Turn to Look Behind Over each Shoulder: Turn sideways only but maintains balance  Turn 360 Degrees: Able to turn 360 degrees safely but slowly Standing Unsupported, Alternately Place Feet on Step/Stool: Able to complete >2 steps/needs minimal assist Standing Unsupported, One Foot in Front: Needs help to step but can hold 15 seconds Standing on One Leg: Unable to try or needs assist to prevent fall Total Score: 34  Exercise    End of Session PT - End of Session Equipment Utilized During Treatment: Gait belt Activity Tolerance: Patient tolerated treatment well Patient left: in chair;with call bell in reach General Behavior During Session: [facility] for tasks performed Cognition: [facility] for tasks performed  Rebeca Alert [facility] 03/26/2011, 11:40 AM

## 2011-03-26 NOTE — Progress Notes (Signed)
Per PT recommendations, patient would benefit from OP balance therapy. Spoke with patient and family at bedside, they are in agreement with OP therapy. Referral made to [facility] OP facility in [place] close to their home. They state he has been to this facility in the past. Order/referral signed by Dr. Darnelle Catalan and faxed to [facility] facility.

## 2011-03-26 NOTE — Discharge Summary (Signed)
Physician Discharge Summary   Physician Discharge Summary  Patient ID: Marvin Leon MRN: [id number] DOB/AGE: 1932/12/29 77 y.o.  Admit date: 03/21/2011 Discharge date: 03/26/2011  Primary Care Physician:  Dr. Warrick Parisian.  Discharge Diagnoses:    Present on Admission:  .Abdominal pain .Nausea and vomiting in adult patient .DM (diabetes mellitus) type II uncontrolled, periph vascular disorder .HTN (hypertension) .PVD (peripheral vascular disease) .Hyperlipemia .CAD (coronary artery disease) .BPH (benign prostatic hyperplasia) .GERD (gastroesophageal reflux disease) .Constipation  Discharge Medications:  Current Discharge Medication List    START taking these medications   Details  metoCLOPramide (REGLAN) 5 MG tablet Take 1 tablet (5 mg total) by mouth 4 (four) times daily -  before meals and at bedtime. Qty: 120 tablet, Refills: 2    polyethylene glycol (MIRALAX / GLYCOLAX) packet Take 17 g by mouth daily. Qty: 14 each, Refills: 2      CONTINUE these medications which have NOT CHANGED   Details  acidophilus (RISAQUAD) CAPS Take 1 capsule by mouth daily.      ALPRAZolam (XANAX) 1 MG tablet Take 1 mg by mouth 2 (two) times daily.      aspirin EC 81 MG tablet Take 81 mg by mouth every morning.      Calcium Carbonate-Vitamin D (CALCIUM 600/VITAMIN D) 600-400 MG-UNIT per tablet Take 1 tablet by mouth daily.      clopidogrel (PLAVIX) 75 MG tablet Take 75 mg by mouth daily.      dexlansoprazole (DEXILANT) 60 MG capsule Take 60 mg by mouth daily.      doxazosin (CARDURA) 8 MG tablet Take 8 mg by mouth at bedtime.      ezetimibe-simvastatin (VYTORIN) 10-40 MG per tablet Take 1 tablet by mouth daily.      isosorbide mononitrate (IMDUR) 30 MG 24 hr tablet Take 15 mg by mouth at bedtime.      metFORMIN (GLUCOPHAGE) 500 MG tablet Take 500 mg by mouth at bedtime.      metoprolol (TOPROL-XL) 50 MG 24 hr tablet Take 50 mg by mouth every morning.      Multiple  Vitamins-Minerals (MULTIVITAMINS THER. W/MINERALS) TABS Take 1 tablet by mouth daily.      niacin (NIASPAN) 750 MG CR tablet Take 1,500 mg by mouth at bedtime.      oxyCODONE (OXY IR/ROXICODONE) 5 MG immediate release tablet Take 5 mg by mouth every 6 (six) hours as needed. For pain     PARoxetine (PAXIL) 20 MG tablet Take 20 mg by mouth every evening.      traZODone (DESYREL) 50 MG tablet Take 50 mg by mouth at bedtime.        STOP taking these medications     sucralfate (CARAFATE) 1 GM/10ML suspension          Disposition and Follow-up:   Consults:  Dr. Loreta Ave of gastroenterology.   Significant Diagnostic Studies:  Nm Hepatobiliary Liver Func  03/21/2011  *RADIOLOGY REPORT*  Clinical Data: Right upper quadrant pain  NUCLEAR MEDICINE HEPATOHBILIARY INCLUDE GB  Radiopharmaceutical: 5 mCi technetium 30m Choletec  Comparison: Ultrasound exam from 03/13/2011.  CT scan from 03/11/2011.  Findings: There is brisk uptake of radiotracer by the hepatic parenchyma, with clearance of blood flow activity by five - 10 minutes.  Biliary activity is seen at 20 minutes.  Gallbladder filling starts at 30 minutes, and gut activity is visible by 50 minutes.  IMPRESSION: Normal hepatobiliary patency scan.  Original Report Authenticated By: ERIC A. MANSELL, M.D.   Nm Gastric  Emptying  03/23/2011  *RADIOLOGY REPORT*  Clinical data: Abdominal pain, nausea, vomiting  NUCLEAR MEDICINE GASTRIC EMPTYING EXAM:  Radiopharmaceutical:  2.2 mCi Tc-38m sulfur colloid labeled egg whites  Technique: The patient ingested a standardized meal containing radiolabeled egg whites. Imaging was performed in the anterior projection for 120 minutes. Gastric emptying is calculated from the obtained images.  Findings: Subjectively mildly decreased emptying of tracer is seen from the stomach with abnormal retained gastric tracer at 2 hours. Quantitative analysis reveals 25% emptying at 1 hour and 63% at 2 hours.  Findings represent  mildly delayed gastric emptying, with normal patients demonstrating greater than 70% emptying at 2 hours.  IMPRESSION: Mildly delayed gastric emptying.  Original Report Authenticated By: Lollie Marrow, M.D.   US Abdomen Complete  03/13/2011  *RADIOLOGY REPORT*  Clinical Data:  Abdominal pain.  Diabetes and hypertension.  ABDOMINAL ULTRASOUND COMPLETE  Comparison:  None.  Findings:  Gallbladder:  No gallstones, gallbladder dilatation, or pericholecystic fluid.  Mild gallbladder wall thickening noted measuring 4 mm, which is nonspecific.  Common Bile Duct:  Within normal limits in caliber for patient age. Measures 7 mm in maximum diameter.  Liver: No focal mass lesion identified.  Within normal limits in parenchymal echogenicity.  IVC:  Appears normal.  Pancreas:  No abnormality identified, although visualization is limited by overlying bowel gas.  Spleen:  Within normal limits in size and echotexture.  Right kidney:  Normal in size and parenchymal echogenicity.  No evidence of mass or hydronephrosis. 2.7 cm cyst noted in the upper pole.  Left kidney:  Normal in size and parenchymal echogenicity.  No evidence of mass or hydronephrosis. 2.7 cm cyst is seen in the lower pole.  Abdominal Aorta:  No aneurysm identified.  IMPRESSION:  1.  No evidence of gallstones, biliary dilatation, or other acute findings. 2.  Mild diffuse gallbladder wall thickening, which is nonspecific and of doubtful clinical significance. 3.  Small bilateral renal cysts incidentally noted.  No evidence of renal mass or hydronephrosis.  Original Report Authenticated By: Danae Orleans, M.D.   Ct Abdomen Pelvis W Contrast  03/11/2011  *RADIOLOGY REPORT*  Clinical Data: Abdominal pain  CT ABDOMEN AND PELVIS WITH CONTRAST  Technique:  Multidetector CT imaging of the abdomen and pelvis was performed following the standard protocol during bolus administration of intravenous contrast.  Contrast:  80 ml Omnipaque-300  Comparison: No comparison  studies available.  Findings:  No focal abnormalities seen in the liver or spleen.  The stomach, pancreas, gallbladder, and adrenal glands are unremarkable.  Several diverticuli are seen in the duodenum.  2.5 cm water density lesion in the upper pole of the right kidney is probably a cyst.  3.3 cm cyst is seen in the lower pole of the left kidney.  No abdominal aortic aneurysm.  No free fluid or lymphadenopathy in the abdomen.  Imaging through the pelvis shows no free intraperitoneal fluid. There is no pelvic sidewall lymphadenopathy.  Diverticular changes are seen in the sigmoid colon without diverticulitis.  Terminal ileum is normal.  The appendix is normal.  Bone windows reveal no worrisome lytic or sclerotic osseous lesions.  IMPRESSION: No acute findings.  No evidence to explain the patient's history of abdominal pain.  Original Report Authenticated By: ERIC A. MANSELL, M.D.   Mr Angiogram Abdomen W Wo Contrast  03/23/2011  *RADIOLOGY REPORT*  Clinical Data:  Abdominal pain, nausea and vomiting.  History of vascular disease.  MRA ABDOMEN WITH AND WITHOUT CONTRAST  Technique:  Angiographic images of the abdomen were obtained using MRA technique with intravenous contrast.  Contrast:  17mL MULTIHANCE GADOBENATE DIMEGLUMINE 529 MG/ML IV SOLN  Comparison:  CT of the abdomen dated 03/11/2011.  Findings:  The abdominal aorta and its branches are well visualized.  Proximal celiac axis shows irregular narrowing approaching 50% in caliber.  Superior mesenteric and inferior mesenteric arteries are normally patent.  Small left renal artery does not show focal stenosis.  Right kidney is supplied by either two arteries originating nearly in identical locations off of the aorta or a single artery demonstrating bifurcation at its origin. No right renal artery stenosis.  Abdominal aorta demonstrates scattered atherosclerotic disease without aneurysmal dilatation or stenosis.  There likely is at least a moderate stenosis at  the origin of the right common iliac artery.  IMPRESSION:  1.  No evidence of significant mesenteric arterial occlusive disease.  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 Other mesenteric arteries show normal patency. 2.  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  Original Report Authenticated By: Reola Calkins, M.D.   Dg Abd Acute W/chest  03/22/2011  *RADIOLOGY REPORT*  Clinical Data: .  Nausea vomiting.  ACUTE ABDOMEN SERIES (ABDOMEN 2 VIEW & CHEST 1 VIEW)  Comparison: CT from 03/11/2011  Findings: The lungs are clear without focal infiltrate, edema, pneumothorax or pleural effusion. Interstitial markings are diffusely coarsened with chronic features.  Nodular density over the posterior right fifth rib has imaging features compatible with a cardiac pad.  Right-sided decubitus film shows no intraperitoneal free air. Supine film shows no gaseous bowel dilatation to suggest obstruction.  IMPRESSION: No acute cardiopulmonary process.  No evidence for bowel obstruction or perforation.  Original Report Authenticated By: ERIC A. MANSELL, M.D.    Discharge Laboratory Values: Results for orders placed during the hospital encounter of 03/21/11 (from the past 48 hour(s))  GLUCOSE, CAPILLARY     Status: Abnormal   Collection Time   03/24/11  4:59 PM      Component Value Range Comment   Glucose-Capillary 113 (*) 70 - 99 (mg/dL)    Comment 1 Documented in Chart      Comment 2 Notify RN     GLUCOSE, CAPILLARY     Status: Abnormal   Collection Time   03/24/11  5:44 PM      Component Value Range Comment   Glucose-Capillary 113 (*) 70 - 99 (mg/dL)    Comment 1 Documented in Chart      Comment 2 Notify RN     GLUCOSE, CAPILLARY     Status: Abnormal   Collection Time   03/24/11  9:00 PM      Component Value Range Comment   Glucose-Capillary 134 (*) 70 - 99 (mg/dL)    Comment 1 Notify RN     GLUCOSE, CAPILLARY     Status: Abnormal   Collection Time   03/25/11   7:53 AM      Component Value Range Comment   Glucose-Capillary 125 (*) 70 - 99 (mg/dL)   GLUCOSE, CAPILLARY     Status: Abnormal   Collection Time   03/25/11 12:05 PM      Component Value Range Comment   Glucose-Capillary 136 (*) 70 - 99 (mg/dL)   GLUCOSE, CAPILLARY     Status: Abnormal   Collection Time   03/25/11  4:49 PM      Component Value Range Comment   Glucose-Capillary 121 (*) 70 - 99 (mg/dL)  GLUCOSE, CAPILLARY     Status: Abnormal   Collection Time   03/25/11  8:58 PM      Component Value Range Comment   Glucose-Capillary 172 (*) 70 - 99 (mg/dL)   GLUCOSE, CAPILLARY     Status: Abnormal   Collection Time   03/26/11  7:49 AM      Component Value Range Comment   Glucose-Capillary 146 (*) 70 - 99 (mg/dL)   GLUCOSE, CAPILLARY     Status: Abnormal   Collection Time   03/26/11 11:57 AM      Component Value Range Comment   Glucose-Capillary 142 (*) 70 - 99 (mg/dL)     Brief H and P: For complete details please refer to admission H and P, but in brief, Marvin Leon is a  77 year old Caucasian male who was  hospitalized at [facility] for epigastric abdominal pain and nausea on October 25 and DC'd on October 27. While he was there, he had endoscopy which showed some reflux, but not much else. CT scan was negative.  Abdominal ultrasound was negative. The patient returned on the date of admission with similar symptoms including epigastric abdominal pain and nausea.  He was evaluated in the emergency room. A HIDA scan was done because of question of whether he had a dysfunctional gallbladder, but the HIDA scan came back normal; he subsequently referred to the hospitalist service for further evaluation of his persistent and recurrent abdominal pain and nausea.  Physical Exam at Discharge: BP 129/69  Pulse 59  Temp(Src) 98.3 F (36.8 C) (Oral)  Resp 19  Ht 5\' 9"  (1.753 m)  Wt 85.7 kg (188 lb 15 oz)  BMI 27.90 kg/m2  SpO2 96%  General Appearance:  Alert, cooperative, no distress,  appears stated age   Lungs:  Clear to auscultation bilaterally, respirations unlabored   Heart:  Regular rate and rhythm, S1 and S2 normal, no murmur, rub  or gallop   Abdomen:  Soft, non-tender, bowel sounds active all four quadrants,  no masses, no organomegaly   Extremities:  Extremities normal, atraumatic, no cyanosis, 1+ edema   Pulses:  2+ and symmetric all extremities     Hospital Course:  Principal Problem:  *Abdominal pain: The patient has not had any significant abdominal pain for the past 4 days. He had an extensive evaluation including a negative HIDA scan.  His gastric emptying study revealed some mild gastroparesis. His MR angiogram did not show any critical stenosis. At this point, his abdominal pain is most likely a combination of mild gastroparesis, reflux, and constipation. We have maximized treatment for these 3 entities and he is eating without difficulty and has had 2 bowel movements over the past 24 hours. The plan is to discharge him home and he can followup with Dr. Loreta Ave in 2 weeks' time. Active Problems:  Nausea and vomiting in adult patient: The patient has not had any vomiting. He is tolerating a carbohydrate modified diet. He was evaluated by the dietitian and given dietary teaching with regard to his diabetes and gastroparesis.  DM (diabetes mellitus) type II uncontrolled: The patient's blood glucose checks have indicated that he is well-controlled. Periphal vascular disorder: We will continue his antiplatelet therapy. The MRA did not reveal any critical stenosis.  CAD (coronary artery disease): The patient's cardiac markers have been negative. He is currently on antiplatelet therapy and has been followed closely by his cardiologist as an outpatient. He had a cardiac catheterization done 12/2008 which showed patent stents. Can followup  with his cardiologist as an outpatient.  Gastroparesis due to DM: The patient was started on Reglan which we will continue at discharge.  This can be titrated up to affect, if needed.  HTN (hypertension): Patient's blood pressure has improved over the past 48 hours. We have discontinued his IV fluids and his systolic blood pressures have been stable in the 140s. His diastolic blood pressures have been in the 60s.  GERD (gastroesophageal reflux disease): Continue PPI therapy. Carafate has been discontinued by the gastroenterologists.  Constipation: We have placed him on a bowel regimen for better control of his constipation. Hopefully Reglan will help as well.  Hyperlipemia: Continue Vytorin.  BPH (benign prostatic hyperplasia): Continue Doxazosin.   Recommendations for hospital follow-up: 1.  Evaluate effectiveness of reglan therapy and titrate up as needed.  Time spent on Discharge: 45 minutes.  Signed: Calani Gick 03/26/2011, 2:25 PM

## 2011-03-26 NOTE — Plan of Care (Signed)
Problem: Phase I Progression Outcomes Goal: Hemodynamically stable Outcome: Completed/Met Date Met:  03/26/11 VSS, no pain at this time. Goal: Other Phase I Outcomes/Goals Outcome: Progressing Pt ambulated in hallway with assistance.

## 2011-03-26 NOTE — Plan of Care (Signed)
Nutrition dx:  Nutrition-related knowledge deficit r/t diet therapy AEB MD/nursing request  Intervention:  Brief education;  Provided.  Monitoring:  Knowledge; for questions.  Pager: [phone]

## 2012-10-21 ENCOUNTER — Telehealth: Payer: Self-pay | Admitting: Nurse Practitioner

## 2012-10-21 NOTE — Telephone Encounter (Signed)
Olegario Messier, patient's daughter, is calling to set up an annual appointment with Darrol Angel.  Since it's been a while, I thought I should send this note back before scheduling.   Olegario Messier can be reached at:  [phone]

## 2012-10-24 ENCOUNTER — Telehealth: Payer: Self-pay | Admitting: *Deleted

## 2012-10-25 NOTE — Telephone Encounter (Signed)
Called and spoke to patients daughters Marvin Leon let her no that we got the message and patient needed to be sch. With Dr.Sethi and then he could got back to [place]. Last seen 10/23/2009. Marvin Leon. Patients daughter understood. Patient having headaches daily. Will give message to scheduler.

## 2012-10-25 NOTE — Telephone Encounter (Signed)
Talked to patients daughter Patient needs to see Zettie Pho has not seen patient since 2012. Patient can follow with Eber Jones after Pearlean Brownie talked to daughter and she understood. Patient having headaches daily.

## 2012-10-25 NOTE — Telephone Encounter (Signed)
I called and spoke to daughter about appt for father.   Recommended that pt be seen for headaches at pcp.  Appt with Dr. Pearlean Brownie will be made by scheduling.

## 2012-11-01 ENCOUNTER — Telehealth: Payer: Self-pay | Admitting: *Deleted

## 2012-11-01 MED ORDER — EZETIMIBE-SIMVASTATIN 10-40 MG PO TABS: 1.0000 | ORAL_TABLET | Freq: Every day | ORAL | Status: DC

## 2012-11-01 NOTE — Telephone Encounter (Signed)
Olegario Messier asked for a refill of the vytorin.  Mr Llerena has been able to tolerate the medication by taking in qod.  I advised to continue doing that.  Olegario Messier asked if mr Malicoat could take an otc iron pill.  I said that i don't see any interactions with any of his meds, but contact primary MD to make sure additional testing doesn't need to be done for reasons of fatigue.  Patient just had labs drawn at primary MD, but Olegario Messier doesn't know any numbers.  I advised to contact primary MD.

## 2012-12-12 ENCOUNTER — Other Ambulatory Visit: Payer: Self-pay | Admitting: Family Medicine

## 2012-12-12 DIAGNOSIS — R10811 Right upper quadrant abdominal tenderness: Secondary | ICD-10-CM

## 2012-12-13 ENCOUNTER — Ambulatory Visit
Admission: RE | Admit: 2012-12-13 | Discharge: 2012-12-13 | Disposition: A | Discharge status: Home / Self Care | Payer: PRIVATE HEALTH INSURANCE | Source: Ambulatory Visit | Attending: Family Medicine | Admitting: Family Medicine

## 2012-12-13 DIAGNOSIS — R10811 Right upper quadrant abdominal tenderness: Secondary | ICD-10-CM

## 2012-12-16 ENCOUNTER — Ambulatory Visit (INDEPENDENT_AMBULATORY_CARE_PROVIDER_SITE_OTHER): Payer: PRIVATE HEALTH INSURANCE | Admitting: General Surgery

## 2012-12-16 ENCOUNTER — Encounter (INDEPENDENT_AMBULATORY_CARE_PROVIDER_SITE_OTHER): Payer: Self-pay | Admitting: General Surgery

## 2012-12-16 VITALS — BP 102/58 | HR 68 | Temp 98.1°F | Resp 14 | Ht 69.0 in | Wt 169.2 lb

## 2012-12-16 DIAGNOSIS — K829 Disease of gallbladder, unspecified: Secondary | ICD-10-CM | POA: Insufficient documentation

## 2012-12-16 NOTE — Patient Instructions (Signed)
Will need cardiac clearance from Dr. Gery Pray prior to scheduling lap chole with ioc

## 2012-12-19 ENCOUNTER — Encounter (INDEPENDENT_AMBULATORY_CARE_PROVIDER_SITE_OTHER): Payer: Self-pay | Admitting: General Surgery

## 2012-12-19 NOTE — Progress Notes (Signed)
Patient ID: Marvin Leon, male   DOB: 1933/05/17, 79 y.o.   MRN: [id number]  Chief Complaint  Patient presents with  . New Evaluation    eval gb polyp    HPI Marvin Leon is a 79 y.o. male.  We're asked to see the patient in consultation by Dr. Creta Leon to evaluate him for a gallbladder polyp. The patient is a 79 year old white male who has been experiencing some right flank pain recently. He denies any nausea or vomiting. He states that the pain is fairly constant. He has a history of cardiac stents placed by Dr. Gery Pray in cardiology. His recent ultrasound showed changes of adenomyomatosis of the gallbladder but no stones or ductal dilatation. HPI  Past Medical History  Diagnosis Date  . Arthritis   . Diabetes mellitus without complication   . GERD (gastroesophageal reflux disease)   . Hyperlipidemia   . Hypertension     Past Surgical History  Procedure Laterality Date  . Cardiac catheterization      3 stints placed  . Colonoscopy      Family History  Problem Relation Age of Onset  . Heart disease Mother   . Heart disease Father     Social History History  Substance Use Topics  . Smoking status: Former Smoker    Quit date: 05/18/1977  . Smokeless tobacco: Never Used  . Alcohol Use: No    Allergies  Allergen Reactions  . Darvocet (Propoxyphene-Acetaminophen) Nausea And Vomiting  . Penicillins Other (See Comments)    Reaction to penicillin and darvocet unknown  . Propoxyphene And Methadone Other (See Comments)    Current Outpatient Prescriptions  Medication Sig Dispense Refill  . ALPRAZolam (XANAX) 1 MG tablet Take 1 mg by mouth 2 (two) times daily.        Marland Kitchen aspirin EC 81 MG tablet Take 81 mg by mouth every morning.        . Calcium Carbonate-Vitamin D (CALCIUM 600/VITAMIN D) 600-400 MG-UNIT per tablet Take 1 tablet by mouth daily.        . Cetirizine HCl 10 MG CAPS Take by mouth.      . clopidogrel (PLAVIX) 75 MG tablet Take 75 mg by mouth daily.        Marland Kitchen  dexlansoprazole (DEXILANT) 60 MG capsule Take 60 mg by mouth daily.        Marland Kitchen ezetimibe-simvastatin (VYTORIN) 10-40 MG per tablet Take 1 tablet by mouth daily.  30 tablet  6  . Ferrous Sulfate (IRON) 28 MG TABS Take by mouth.      . isosorbide mononitrate (IMDUR) 30 MG 24 hr tablet Take 15 mg by mouth at bedtime.        . metFORMIN (GLUCOPHAGE) 500 MG tablet Take 1,000 mg by mouth 2 (two) times daily.       . metoCLOPramide (REGLAN) 10 MG tablet Take 10 mg by mouth 4 (four) times daily.      . metoprolol (TOPROL-XL) 50 MG 24 hr tablet Take 50 mg by mouth every morning.        . Multiple Vitamins-Minerals (MULTIVITAMINS THER. W/MINERALS) TABS Take 1 tablet by mouth daily.        . niacin (NIASPAN) 750 MG CR tablet Take 1,500 mg by mouth at bedtime.        Marland Kitchen OVER THE COUNTER MEDICATION       . OVER THE COUNTER MEDICATION       . PARoxetine (PAXIL) 20 MG tablet Take 20 mg  by mouth every evening.        . pregabalin (LYRICA) 75 MG capsule Take 75 mg by mouth 2 (two) times daily.      . tamsulosin (FLOMAX) 0.4 MG CAPS       . metoCLOPramide (REGLAN) 5 MG tablet Take 1 tablet (5 mg total) by mouth 4 (four) times daily -  before meals and at bedtime.  120 tablet  2  . oxyCODONE (OXY IR/ROXICODONE) 5 MG immediate release tablet Take 5 mg by mouth every 6 (six) hours as needed. For pain        No current facility-administered medications for this visit.    Review of Systems Review of Systems  Constitutional: Negative.   HENT: Negative.   Eyes: Negative.   Respiratory: Negative.   Cardiovascular: Negative.   Gastrointestinal: Negative.   Endocrine: Negative.   Genitourinary: Negative.   Musculoskeletal: Negative.   Skin: Negative.   Allergic/Immunologic: Negative.   Neurological: Negative.   Hematological: Negative.   Psychiatric/Behavioral: Negative.     Blood pressure 102/58, pulse 68, temperature 98.1 F (36.7 C), temperature source Temporal, resp. rate 14, height 5\' 9"  (1.753 m),  weight 169 lb 3.2 oz (76.749 kg).  Physical Exam Physical Exam  Constitutional: He is oriented to person, place, and time. He appears well-developed and well-nourished.  HENT:  Head: Normocephalic and atraumatic.  Eyes: Conjunctivae and EOM are normal. Pupils are equal, round, and reactive to light.  Neck: Normal range of motion. Neck supple.  Cardiovascular: Normal rate, regular rhythm and normal heart sounds.   Pulmonary/Chest: Effort normal and breath sounds normal.  Abdominal: Soft. Bowel sounds are normal.  Musculoskeletal: Normal range of motion.  Neurological: He is alert and oriented to person, place, and time.  Skin: Skin is warm and dry.  Psychiatric: He has a normal mood and affect. His behavior is normal.    Data Reviewed As above  Assessment    The patient is having symptoms that could be consistent with biliary colic from adenomyomatosis. I believe it is possible that removing his gallbladder may help his symptoms but there is probably an equal chance that it may not. I have discussed this in detail with him and offered him a laparoscopic cholecystectomy with cholangiogram. I have discussed with him in detail the risks and benefits of the operation as well as some of the technical aspects and he understands and wishes to proceed. We will need cardiac clearance prior to scheduling surgery. He will need to be off his blood thinner for 5 days prior to surgery.     Plan    Plan for cardiac clearance and then laparoscopic cholecystectomy with intraoperative cholangiogram        TOTH III,PAUL S 12/19/2012, 2:33 PM

## 2012-12-26 ENCOUNTER — Encounter: Payer: Self-pay | Admitting: Neurology

## 2012-12-26 ENCOUNTER — Ambulatory Visit (INDEPENDENT_AMBULATORY_CARE_PROVIDER_SITE_OTHER): Payer: Medicare Other | Admitting: Neurology

## 2012-12-26 VITALS — BP 125/66 | HR 66 | Ht 69.0 in | Wt 170.0 lb

## 2012-12-26 DIAGNOSIS — R42 Dizziness and giddiness: Secondary | ICD-10-CM

## 2012-12-26 NOTE — Progress Notes (Signed)
[facility] [address] [place]. [place] [address] [phone]       OFFICE FOLLOW-UP NOTE  [name] Date of Birth:  09-Dec-1932 Medical Record Number:  [id number]   HPI:  79 year old male with diabetic peripheral neuropathy with chronic dizziness likely multifactorial due to combination of diabetic neuropathy as well as orthostatic symptoms.  12/26/2012  he returns followup today after last visit on 10/24/10. He continues intermittent dizziness which he describes as a feeling of imbalance particularly when is was getting up or walking or making a sudden movement. He denies any vertigo, blurred vision. He had he has had a few falls but no major injury. He uses a cane for short distances on a walker for long distances. He states his diabetes control has been better after medication changes by his primary physician and following a new diet. His fasting sugars range in the 110-120 range. He continues to have burning and paresthesias in his feet and takes clinical 75 twice a day which is tolerating quite well. The burning in the feet seems to be more with increased walking.  ROS:   14 system review of systems is positive for  hearing loss, easy bruising, constipation, runny nose, memory loss, confusion, headache, weakness, dizziness, depression, anxiety, disinterest in activities and sleepiness. PMH:  Past Medical History  Diagnosis Date  . Arthritis   . Diabetes mellitus without complication   . GERD (gastroesophageal reflux disease)   . Hyperlipidemia   . Hypertension     Social History:  History   Social History  . Marital Status: Married    Spouse Name: N/A    Number of Children: N/A  . Years of Education: N/A   Occupational History  . Not on file.   Social History Main Topics  . Smoking status: Former Smoker    Quit date: 05/18/1977  . Smokeless tobacco: Never Used  . Alcohol Use: No  . Drug Use: No  . Sexually Active: Not on file   Other Topics  Concern  . Not on file   Social History Narrative   His father is deceased from a heart attack at age 52. His mother is deceased from congestive heart failure at age 58.  His mother also had rectal cancer, heart disease and diabetes. There is heart disease, diabetes and lung disease in his brothers. There is lung disease and psychological problems in a sister.    Medications:   Current Outpatient Prescriptions on File Prior to Visit  Medication Sig Dispense Refill  . ALPRAZolam (XANAX) 1 MG tablet Take 1 mg by mouth 2 (two) times daily.        Marland Kitchen aspirin EC 81 MG tablet Take 81 mg by mouth every morning.        . Calcium Carbonate-Vitamin D (CALCIUM 600/VITAMIN D) 600-400 MG-UNIT per tablet Take 1 tablet by mouth daily.        . Cetirizine HCl 10 MG CAPS Take by mouth.      . clopidogrel (PLAVIX) 75 MG tablet Take 75 mg by mouth daily.        Marland Kitchen dexlansoprazole (DEXILANT) 60 MG capsule Take 60 mg by mouth daily.        Marland Kitchen ezetimibe-simvastatin (VYTORIN) 10-40 MG per tablet Take 1 tablet by mouth daily.  30 tablet  6  . Ferrous Sulfate (IRON) 28 MG TABS Take by mouth.      . isosorbide mononitrate (IMDUR) 30 MG 24 hr tablet Take 15 mg by  mouth at bedtime.        . metFORMIN (GLUCOPHAGE) 500 MG tablet Take 1,000 mg by mouth 2 (two) times daily.       . metoprolol (TOPROL-XL) 50 MG 24 hr tablet Take 50 mg by mouth every morning.        . Multiple Vitamins-Minerals (MULTIVITAMINS THER. W/MINERALS) TABS Take 1 tablet by mouth daily.        . niacin (NIASPAN) 750 MG CR tablet Take 1,500 mg by mouth at bedtime.        Marland Kitchen OVER THE COUNTER MEDICATION       . OVER THE COUNTER MEDICATION       . PARoxetine (PAXIL) 20 MG tablet Take 20 mg by mouth every evening.        . pregabalin (LYRICA) 75 MG capsule Take 75 mg by mouth 2 (two) times daily.      . tamsulosin (FLOMAX) 0.4 MG CAPS       . metoCLOPramide (REGLAN) 5 MG tablet Take 1 tablet (5 mg total) by mouth 4 (four) times daily -  before meals and at  bedtime.  120 tablet  2   No current facility-administered medications on file prior to visit.    Allergies:   Allergies  Allergen Reactions  . Darvocet (Propoxyphene-Acetaminophen) Nausea And Vomiting  . Penicillins Other (See Comments)    Reaction to penicillin and darvocet unknown  . Propoxyphene And Methadone Other (See Comments)    Physical Exam General: well developed, well nourished, seated, in no evident distress Head: head normocephalic and atraumatic. Orohparynx benign Neck: supple with no carotid or supraclavicular bruits Cardiovascular: regular rate and rhythm, no murmurs Musculoskeletal: no deformity Skin:  no rash/petichiae Vascular:  Normal pulses all extremities Filed Vitals:   12/26/12 1541  BP: 125/66  Pulse: 66    Neurologic Exam Mental Status: Awake and fully alert. Oriented to place and time. Recent and remote memory intact. Attention span, concentration and fund of knowledge appropriate. Mood and affect appropriate.  Cranial Nerves: Fundoscopic exam reveals sharp disc margins. Pupils equal, briskly reactive to light. Extraocular movements full without nystagmus. Visual fields full to confrontation. Hearing intact. Facial sensation intact. Face, tongue, palate moves normally and symmetrically.  Motor: Normal bulk and tone. Normal strength in all tested extremity muscles. Sensory.: Diminished touch, pinprick and vibration sensation from ankle down. Coordination: Rapid alternating movements normal in all extremities. Finger-to-nose and heel-to-shin performed accurately bilaterally. Gait and Station: Arises from chair without difficulty. Stance is normal. Gait demonstrates normal stride length and balance . Unsteady while standing on a narrow base. Unable   to heel, toe and tandem walk without difficulty.  Reflexes: 1+ and symmetric except ankle jerks are depressed. Toes downgoing.      ASSESSMENT:  79 year old male with diabetic peripheral neuropathy with  chronic dizziness likely multifactorial due to combination of diabetic neuropathy as well as orthostatic symptoms.   PLAN: He was advised to do orthostatic tolerance exercises before getting up and continue Lyrica 75 mg twice daily for his neuropathic pain. Return for followup in 3 months with Heide Guile, NP

## 2012-12-26 NOTE — Patient Instructions (Addendum)
He was advised to do orthostatic tolerance exercises before getting up and continue Lyrica 75 mg twice daily for his neuropathic pain. Return for followup in 3 months with Heide Guile, NP

## 2013-01-25 ENCOUNTER — Ambulatory Visit (INDEPENDENT_AMBULATORY_CARE_PROVIDER_SITE_OTHER): Payer: PRIVATE HEALTH INSURANCE | Admitting: Cardiovascular Disease

## 2013-01-25 ENCOUNTER — Encounter: Payer: Self-pay | Admitting: Cardiovascular Disease

## 2013-01-25 VITALS — BP 126/68 | HR 62 | Ht 69.0 in | Wt 166.6 lb

## 2013-01-25 DIAGNOSIS — I10 Essential (primary) hypertension: Secondary | ICD-10-CM

## 2013-01-25 DIAGNOSIS — E785 Hyperlipidemia, unspecified: Secondary | ICD-10-CM

## 2013-01-25 DIAGNOSIS — I2510 Atherosclerotic heart disease of native coronary artery without angina pectoris: Secondary | ICD-10-CM

## 2013-01-25 DIAGNOSIS — Z79899 Other long term (current) drug therapy: Secondary | ICD-10-CM

## 2013-01-25 NOTE — Assessment & Plan Note (Signed)
Well-controlled on current medications 

## 2013-01-25 NOTE — Progress Notes (Signed)
01/25/2013 Marvin Leon   [birth year]  [id number]  Primary Physician Marvin Parisian, MD Primary Cardiologist: Marvin Gess MD Marvin Leon   HPI:  The patient is a 79 year old, mildly overweight, married Caucasian male, father of 4, grandfather to 6 grandchildren who I last saw 9 months ago. He has a history of CAD status post previous PCI and stenting of all 3 of his heart vessels. His other problems include noninsulin-requiring diabetes, hypertension and hyperlipidemia. I last catheterized him December 18, 2008, during a rule out-MI admission revealing noncritical CAD with patent stents and normal LV function. He also had a documented 50% to 60% ostial right common iliac artery stenosis. We have been following his carotid Dopplers. He really denies claudication but has chronic pain in his lower extremities with some diabetic peripheral neuropathic symptoms as well. He says he "staggers" and is chronically weak. Lower extremity Dopplers performed a year ago revealed normal ABIs and carotid Dopplers showed no significant evidence of ICA stenosis.  Since he will was seen 6 months ago he has been completely asymptomatic. He did stop his Vytorin because of leg pain but the pain persisted and he suddenly has restarted his Vytorin. We will recheck a lipid liver profile. He apparently needs elective cholecystectomy which I am going to clear him for a low-risk.      Current Outpatient Prescriptions  Medication Sig Dispense Refill  . ALPRAZolam (XANAX) 1 MG tablet Take 1 mg by mouth 2 (two) times daily.        Marland Kitchen aspirin EC 81 MG tablet Take 81 mg by mouth every morning.        . Calcium Carbonate-Vitamin D (CALCIUM 600/VITAMIN D) 600-400 MG-UNIT per tablet Take 1 tablet by mouth daily.        . Cetirizine HCl 10 MG CAPS Take 1 capsule by mouth daily as needed.       . cholecalciferol (VITAMIN D) 400 UNITS TABS tablet Take 400 Units by mouth daily.      . clopidogrel (PLAVIX) 75 MG tablet  Take 75 mg by mouth daily.        Marland Kitchen dexlansoprazole (DEXILANT) 60 MG capsule Take 60 mg by mouth daily.        Tery Sanfilippo Calcium (STOOL SOFTENER PO) Take 1 tablet by mouth as needed.      . ezetimibe-simvastatin (VYTORIN) 10-40 MG per tablet Take 1 tablet by mouth daily. Taking every 2 days      . Ferrous Sulfate (IRON) 28 MG TABS Take 28 mg by mouth. Every 2 days      . isosorbide mononitrate (IMDUR) 30 MG 24 hr tablet Take 15 mg by mouth at bedtime.        . metFORMIN (GLUCOPHAGE) 1000 MG tablet Take 1,000 mg by mouth 2 (two) times daily with a meal.      . metoCLOPramide (REGLAN) 5 MG tablet Take 1 tablet (5 mg total) by mouth 4 (four) times daily -  before meals and at bedtime.  120 tablet  2  . metoprolol (TOPROL-XL) 50 MG 24 hr tablet Take 25 mg by mouth every morning.       . Multiple Vitamins-Minerals (MULTIVITAMINS THER. W/MINERALS) TABS Take 1 tablet by mouth daily.        . niacin (NIASPAN) 750 MG CR tablet Taking 1,500mg  for 2 days and 750mg  for 2 days and repeat      . OVER THE COUNTER MEDICATION       . PARoxetine (PAXIL)  20 MG tablet Take 20 mg by mouth every evening.        . pregabalin (LYRICA) 75 MG capsule Take 75 mg by mouth 2 (two) times daily.      . tamsulosin (FLOMAX) 0.4 MG CAPS        No current facility-administered medications for this visit.    Allergies  Allergen Reactions  . Darvocet [Propoxyphene-Acetaminophen] Nausea And Vomiting  . Penicillins Other (See Comments)    Reaction to penicillin and darvocet unknown  . Propoxyphene And Methadone Other (See Comments)    History   Social History  . Marital Status: Married    Spouse Name: N/A    Number of Children: N/A  . Years of Education: N/A   Occupational History  . Not on file.   Social History Main Topics  . Smoking status: Former Smoker    Quit date: 05/18/1977  . Smokeless tobacco: Never Used  . Alcohol Use: No  . Drug Use: No  . Sexual Activity: Not on file   Other Topics Concern  .  Not on file   Social History Narrative   His father is deceased from a heart attack at age 49. His mother is deceased from congestive heart failure at age 13.  His mother also had rectal cancer, heart disease and diabetes. There is heart disease, diabetes and lung disease in his brothers. There is lung disease and psychological problems in a sister.     Review of Systems: General: negative for chills, fever, night sweats or weight changes.  Cardiovascular: negative for chest pain, dyspnea on exertion, edema, orthopnea, palpitations, paroxysmal nocturnal dyspnea or shortness of breath Dermatological: negative for rash Respiratory: negative for cough or wheezing Urologic: negative for hematuria Abdominal: negative for nausea, vomiting, diarrhea, bright red blood per rectum, melena, or hematemesis Neurologic: negative for visual changes, syncope, or dizziness All other systems reviewed and are otherwise negative except as noted above.    Blood pressure 126/68, pulse 62, height 5\' 9"  (1.753 m), weight 166 lb 9.6 oz (75.569 kg).  General appearance: alert and no distress Neck: no adenopathy, no carotid bruit, no JVD, supple, symmetrical, trachea midline and thyroid not enlarged, symmetric, no tenderness/mass/nodules Lungs: clear to auscultation bilaterally Heart: regular rate and rhythm, S1, S2 normal, no murmur, click, rub or gallop Extremities: extremities normal, atraumatic, no cyanosis or edema  EKG normal sinus rhythm at 62 without ST or T wave changes  ASSESSMENT AND PLAN:   CAD (coronary artery disease) Previous stenting of all 3 heart vessels. His left calf 12/18/2001 a rule out MI admission revealing noncritical CAD with patent stents and normal LV function. He denies chest pain or shortness of breath. His last Myoview performed 02/25/11 that showed mild inferoseptal ischemia  HTN (hypertension) Well-controlled on current medications  Hyperlipemia The patient has restarted  Vytorin. We will recheck a lipid and liver profile      Marvin Gess MD [facility] 01/25/2013 5:13 PM

## 2013-01-25 NOTE — Patient Instructions (Addendum)
Your physician wants you to follow-up in: 6 months with [facility] and 1 year with Dr Allyson Sabal.  You will receive a reminder letter in the mail two months in advance. If you don't receive a letter, please call our office to schedule the follow-up appointment.   Dr Allyson Sabal has ordered blood work to be done fasting.  Dr Allyson Sabal has cleared your for your gallbladder surgery.

## 2013-01-25 NOTE — Assessment & Plan Note (Signed)
Previous stenting of all 3 heart vessels. His left calf 12/18/2001 a rule out MI admission revealing noncritical CAD with patent stents and normal LV function. He denies chest pain or shortness of breath. His last Myoview performed 02/25/11 that showed mild inferoseptal ischemia

## 2013-01-25 NOTE — Assessment & Plan Note (Signed)
The patient has restarted Vytorin. We will recheck a lipid and liver profile

## 2013-02-16 ENCOUNTER — Telehealth (INDEPENDENT_AMBULATORY_CARE_PROVIDER_SITE_OTHER): Payer: Self-pay | Admitting: *Deleted

## 2013-02-16 ENCOUNTER — Encounter (HOSPITAL_COMMUNITY): Payer: Self-pay

## 2013-02-16 ENCOUNTER — Emergency Department (HOSPITAL_COMMUNITY)
Admission: EM | Admit: 2013-02-16 | Discharge: 2013-02-16 | Disposition: A | Discharge status: Home / Self Care | Payer: PRIVATE HEALTH INSURANCE | Attending: Emergency Medicine | Admitting: Emergency Medicine

## 2013-02-16 DIAGNOSIS — I10 Essential (primary) hypertension: Secondary | ICD-10-CM | POA: Insufficient documentation

## 2013-02-16 DIAGNOSIS — Z880 Allergy status to penicillin: Secondary | ICD-10-CM | POA: Insufficient documentation

## 2013-02-16 DIAGNOSIS — Z87891 Personal history of nicotine dependence: Secondary | ICD-10-CM | POA: Insufficient documentation

## 2013-02-16 DIAGNOSIS — K219 Gastro-esophageal reflux disease without esophagitis: Secondary | ICD-10-CM | POA: Insufficient documentation

## 2013-02-16 DIAGNOSIS — Z79899 Other long term (current) drug therapy: Secondary | ICD-10-CM | POA: Insufficient documentation

## 2013-02-16 DIAGNOSIS — E119 Type 2 diabetes mellitus without complications: Secondary | ICD-10-CM | POA: Insufficient documentation

## 2013-02-16 DIAGNOSIS — M129 Arthropathy, unspecified: Secondary | ICD-10-CM | POA: Insufficient documentation

## 2013-02-16 DIAGNOSIS — I2510 Atherosclerotic heart disease of native coronary artery without angina pectoris: Secondary | ICD-10-CM | POA: Insufficient documentation

## 2013-02-16 DIAGNOSIS — E785 Hyperlipidemia, unspecified: Secondary | ICD-10-CM | POA: Insufficient documentation

## 2013-02-16 DIAGNOSIS — N390 Urinary tract infection, site not specified: Secondary | ICD-10-CM | POA: Insufficient documentation

## 2013-02-16 LAB — CBC WITH DIFFERENTIAL/PLATELET
Eosinophils Relative: 3 % (ref 0–5)
Hemoglobin: 12.5 g/dL — ABNORMAL LOW (ref 13.0–17.0)
Lymphocytes Relative: 17 % (ref 12–46)
Lymphs Abs: 1.8 10*3/uL (ref 0.7–4.0)
MCV: 94.5 fL (ref 78.0–100.0)
Monocytes Relative: 9 % (ref 3–12)
Neutrophils Relative %: 70 % (ref 43–77)
Platelets: 141 10*3/uL — ABNORMAL LOW (ref 150–400)
RBC: 3.8 MIL/uL — ABNORMAL LOW (ref 4.22–5.81)
WBC: 10.4 10*3/uL (ref 4.0–10.5)

## 2013-02-16 LAB — URINE MICROSCOPIC-ADD ON

## 2013-02-16 LAB — URINALYSIS, ROUTINE W REFLEX MICROSCOPIC
Bilirubin Urine: NEGATIVE
Ketones, ur: NEGATIVE mg/dL
Nitrite: NEGATIVE
Protein, ur: NEGATIVE mg/dL
Urobilinogen, UA: 0.2 mg/dL (ref 0.0–1.0)

## 2013-02-16 LAB — COMPREHENSIVE METABOLIC PANEL
ALT: 10 U/L (ref 0–53)
Alkaline Phosphatase: 63 U/L (ref 39–117)
CO2: 23 mEq/L (ref 19–32)
GFR calc Af Amer: 66 mL/min — ABNORMAL LOW (ref >90)
GFR calc non Af Amer: 57 mL/min — ABNORMAL LOW (ref >90)
Glucose, Bld: 124 mg/dL — ABNORMAL HIGH (ref 70–99)
Potassium: 4.2 mEq/L (ref 3.5–5.1)
Sodium: 138 mEq/L (ref 135–145)

## 2013-02-16 LAB — LIPASE, BLOOD: Lipase: 29 U/L (ref 11–59)

## 2013-02-16 MED ORDER — HYDROCODONE-ACETAMINOPHEN 5-325 MG PO TABS: 1.0000 | ORAL_TABLET | ORAL | Status: DC | PRN

## 2013-02-16 MED ORDER — NITROFURANTOIN MONOHYD MACRO 100 MG PO CAPS: 100.0000 mg | ORAL_CAPSULE | Freq: Two times a day (BID) | ORAL | Status: DC

## 2013-02-16 MED ORDER — HYDROCODONE-ACETAMINOPHEN 5-325 MG PO TABS
1.0000 | ORAL_TABLET | Freq: Once | ORAL | Status: AC
  Administered 2013-02-16: 1 via ORAL
  Filled 2013-02-16: qty 1

## 2013-02-16 MED ORDER — DEXTROSE 5 % IV SOLN
1.0000 g | Freq: Once | INTRAVENOUS | Status: AC
  Administered 2013-02-16: 1 g via INTRAVENOUS
  Filled 2013-02-16: qty 10

## 2013-02-16 NOTE — Telephone Encounter (Signed)
Patient's daughter called to ask about gallbladder surgery.  She states the cardiologist sent cardiac clearance back in September and they have been waiting to hear from our office to schedule this.  I do see where Dr. Allyson Sabal cleared patient for gallbladder surgery.  Can orders be placed so that this surgery can be scheduled?  Daughter states her fathers pain is becoming worse and she is concerned.  She is frustrated that we haven't already seen the cardiac clearance and done this.  Explained that it doesn't look like it was sent to [place] and apologized for this however again explained a message would be sent to Dr. Carolynne Edouard to see if we can move forward at this time.  Explained that if pain becomes uncontrollable then she may need to take him to the ED.

## 2013-02-16 NOTE — ED Notes (Signed)
Pt sts he has been experiencing right side pain since August. Was seen by Dr. Allyson Sabal on the 10th of September and was told he needs to have his gallbladder removed and is in process of setting up appointment for the surgery. Pt presents today due to the increasing right side pain. Hx of 3 cardiac stents

## 2013-02-16 NOTE — ED Provider Notes (Signed)
CSN: [id number]     Arrival date & time 02/16/13  1347 History   First MD Initiated Contact with Patient 02/16/13 1350     Chief Complaint  Patient presents with  . Abdominal Pain    HPI   [name]  is here with right sided abdominal pain. In July diagnosed with a adenomatous  polyp in his gallbladder.  Seen by surgery. Cholecystectomy was initially planned. With his cardiologist at medical clearance for surgery. It sounds like the family has not completed all necessary paperwork her phone call to schedule the appointment for the surgery. He said an episode of pain. He presents here. Pain is limited to the lower down his right abdomen.  Appetite. No nausea vomiting. No diarrhea. No shortness of breath and chest pain.  Past Medical History  Diagnosis Date  . Arthritis   . Diabetes mellitus without complication   . GERD (gastroesophageal reflux disease)   . Hyperlipidemia   . Hypertension   . Coronary artery disease   . Carotid artery disease   . Peripheral arterial disease     50% ostial right common iliac artery stenosis without claudication   Past Surgical History  Procedure Laterality Date  . Cardiac catheterization      3 stints placed  . Colonoscopy     Family History  Problem Relation Age of Onset  . Heart disease Mother   . Heart disease Father    History  Substance Use Topics  . Smoking status: Former Smoker    Quit date: 05/18/1977  . Smokeless tobacco: Never Used  . Alcohol Use: No    Review of Systems  Constitutional: Negative for fever, chills, diaphoresis, appetite change and fatigue.  HENT: Negative for sore throat, mouth sores and trouble swallowing.   Eyes: Negative for visual disturbance.  Respiratory: Negative for cough, chest tightness, shortness of breath and wheezing.   Cardiovascular: Negative for chest pain.  Gastrointestinal: Positive for abdominal pain. Negative for nausea, vomiting, diarrhea and abdominal distention.  Endocrine: Positive for  polyuria. Negative for polydipsia and polyphagia.  Genitourinary: Positive for frequency. Negative for dysuria and hematuria.  Musculoskeletal: Negative for gait problem.  Skin: Negative for color change, pallor and rash.  Neurological: Negative for dizziness, syncope, light-headedness and headaches.  Hematological: Does not bruise/bleed easily.  Psychiatric/Behavioral: Negative for behavioral problems and confusion.    Allergies  Darvocet; Penicillins; and Propoxyphene and methadone  Home Medications   Current Outpatient Rx  Name  Route  Sig  Dispense  Refill  . ALPRAZolam (XANAX) 1 MG tablet   Oral   Take 1 mg by mouth 2 (two) times daily.           Marland Kitchen aspirin EC 81 MG tablet   Oral   Take 81 mg by mouth every morning.           . Calcium Carbonate-Vitamin D (CALCIUM 600/VITAMIN D) 600-400 MG-UNIT per tablet   Oral   Take 1 tablet by mouth daily.           . Cetirizine HCl 10 MG CAPS   Oral   Take 5 mg by mouth daily as needed (allergies).          . cholecalciferol (VITAMIN D) 400 UNITS TABS tablet   Oral   Take 400 Units by mouth daily.         . clopidogrel (PLAVIX) 75 MG tablet   Oral   Take 75 mg by mouth daily.           Marland Kitchen  dexlansoprazole (DEXILANT) 60 MG capsule   Oral   Take 60 mg by mouth daily.           Tery Sanfilippo Calcium (STOOL SOFTENER PO)   Oral   Take 1 tablet by mouth as needed.         . ezetimibe-simvastatin (VYTORIN) 10-40 MG per tablet   Oral   Take 1 tablet by mouth daily. Taking every 2 days         . Ferrous Sulfate (IRON) 28 MG TABS   Oral   Take 28 mg by mouth. Every 2 days         . isosorbide mononitrate (IMDUR) 30 MG 24 hr tablet   Oral   Take 15 mg by mouth at bedtime.           . metFORMIN (GLUCOPHAGE) 1000 MG tablet   Oral   Take 1,000 mg by mouth 2 (two) times daily with a meal.         . metoCLOPramide (REGLAN) 5 MG tablet   Oral   Take 1 tablet (5 mg total) by mouth 4 (four) times daily -   before meals and at bedtime.   120 tablet   2     Take 30 minutes before meals and at bedtime.   . metoprolol (TOPROL-XL) 50 MG 24 hr tablet   Oral   Take 25 mg by mouth every morning.          . Multiple Vitamins-Minerals (MULTIVITAMINS THER. W/MINERALS) TABS   Oral   Take 1 tablet by mouth daily.           . niacin (NIASPAN) 750 MG CR tablet   Oral   Take 1,500 mg by mouth at bedtime. Taking 1,500mg  for 2 days and 750mg  for 2 days and repeat         . OVER THE COUNTER MEDICATION               . PARoxetine (PAXIL) 20 MG tablet   Oral   Take 20 mg by mouth every evening.           . pregabalin (LYRICA) 75 MG capsule   Oral   Take 75 mg by mouth 2 (two) times daily.         . tamsulosin (FLOMAX) 0.4 MG CAPS   Oral   Take 0.4 mg by mouth daily after supper.          Marland Kitchen HYDROcodone-acetaminophen (NORCO/VICODIN) 5-325 MG per tablet   Oral   Take 1 tablet by mouth every 4 (four) hours as needed for pain.   10 tablet   0   . nitrofurantoin, macrocrystal-monohydrate, (MACROBID) 100 MG capsule   Oral   Take 1 capsule (100 mg total) by mouth 2 (two) times daily.   20 capsule   0    BP 151/58  Pulse 62  Temp(Src) 98.2 F (36.8 C) (Oral)  Resp 12  Ht 5\' 9"  (1.753 m)  Wt 160 lb (72.576 kg)  BMI 23.62 kg/m2  SpO2 96% Physical Exam  Constitutional: He is oriented to person, place, and time. He appears well-developed and well-nourished. No distress.  HENT:  Head: Normocephalic.  Eyes: Conjunctivae are normal. Pupils are equal, round, and reactive to light. No scleral icterus.  Neck: Normal range of motion. Neck supple. No thyromegaly present.  Cardiovascular: Normal rate and regular rhythm.  Exam reveals no gallop and no friction rub.   No murmur heard. Pulmonary/Chest: Effort normal  and breath sounds normal. No respiratory distress. He has no wheezes. He has no rales.  Abdominal: Soft. Bowel sounds are normal. He exhibits no distension. There is no  tenderness. There is no rebound.  Right mid abdominal and flank pain. He has no tenderness in the upper abdomen over the gallbladder. Is not tender over McBurney's point. Normal testicular exam. Normoactive bowel sounds.  Musculoskeletal: Normal range of motion.  Neurological: He is alert and oriented to person, place, and time.  Skin: Skin is warm and dry. No rash noted.  Psychiatric: He has a normal mood and affect. His behavior is normal.    ED Course  Procedures (including critical care time) EKG: Indication epigastric and upper abdominal pain. Interpretation sinus rhythm ventricular rate 97. Inverted T waves inferiorly. ST segments are isoelectric. No Q waves.  Labs Review Labs Reviewed  CBC WITH DIFFERENTIAL - Abnormal; Notable for the following:    RBC 3.80 (*)    Hemoglobin 12.5 (*)    HCT 35.9 (*)    Platelets 141 (*)    All other components within normal limits  COMPREHENSIVE METABOLIC PANEL - Abnormal; Notable for the following:    Glucose, Bld 124 (*)    GFR calc non Af Amer 57 (*)    GFR calc Af Amer 66 (*)    All other components within normal limits  URINALYSIS, ROUTINE W REFLEX MICROSCOPIC - Abnormal; Notable for the following:    APPearance CLOUDY (*)    Hgb urine dipstick SMALL (*)    Leukocytes, UA LARGE (*)    All other components within normal limits  URINE MICROSCOPIC-ADD ON - Abnormal; Notable for the following:    Bacteria, UA FEW (*)    All other components within normal limits  LIPASE, BLOOD   Imaging Review No results found.  MDM   1. Urinary tract infection    Appears obviously infected. Cultures pending. His given IV Rocephin. Hepatobiliary or pancreatic enzymes normal. Don't think this is an episode of biliary colic or cholecystitis. No additional imaging is necessary. He has a known polyp. No WBC elevation, or abnormal transaminases or lipase. I have asked him to follow up with his surgeon, and  his primary care physician.    Roney Marion,  MD 02/16/13 [phone]

## 2013-02-16 NOTE — ED Notes (Signed)
EKG shown to Dr. Fayrene Fearing

## 2013-02-17 ENCOUNTER — Other Ambulatory Visit (INDEPENDENT_AMBULATORY_CARE_PROVIDER_SITE_OTHER): Payer: Self-pay | Admitting: General Surgery

## 2013-02-17 LAB — URINE CULTURE: Culture: NO GROWTH

## 2013-02-20 ENCOUNTER — Telehealth (INDEPENDENT_AMBULATORY_CARE_PROVIDER_SITE_OTHER): Payer: Self-pay | Admitting: General Surgery

## 2013-02-20 NOTE — Telephone Encounter (Signed)
Orders sent to sx schedulers.

## 2013-02-20 NOTE — Telephone Encounter (Signed)
Spoke with pt and he will call back when ready to have surgery. Placed in pending folder

## 2013-02-22 ENCOUNTER — Emergency Department (HOSPITAL_COMMUNITY)
Admission: EM | Admit: 2013-02-22 | Discharge: 2013-02-23 | Disposition: A | Discharge status: Home / Self Care | Payer: PRIVATE HEALTH INSURANCE | Attending: Emergency Medicine | Admitting: Emergency Medicine

## 2013-02-22 ENCOUNTER — Emergency Department (HOSPITAL_COMMUNITY): Payer: PRIVATE HEALTH INSURANCE

## 2013-02-22 ENCOUNTER — Encounter (HOSPITAL_COMMUNITY): Payer: Self-pay | Admitting: Emergency Medicine

## 2013-02-22 DIAGNOSIS — M25551 Pain in right hip: Secondary | ICD-10-CM

## 2013-02-22 DIAGNOSIS — Z7982 Long term (current) use of aspirin: Secondary | ICD-10-CM | POA: Insufficient documentation

## 2013-02-22 DIAGNOSIS — Z9861 Coronary angioplasty status: Secondary | ICD-10-CM | POA: Insufficient documentation

## 2013-02-22 DIAGNOSIS — I10 Essential (primary) hypertension: Secondary | ICD-10-CM | POA: Insufficient documentation

## 2013-02-22 DIAGNOSIS — R109 Unspecified abdominal pain: Secondary | ICD-10-CM

## 2013-02-22 DIAGNOSIS — M129 Arthropathy, unspecified: Secondary | ICD-10-CM | POA: Insufficient documentation

## 2013-02-22 DIAGNOSIS — M25559 Pain in unspecified hip: Secondary | ICD-10-CM | POA: Insufficient documentation

## 2013-02-22 DIAGNOSIS — R1031 Right lower quadrant pain: Secondary | ICD-10-CM | POA: Insufficient documentation

## 2013-02-22 DIAGNOSIS — Z880 Allergy status to penicillin: Secondary | ICD-10-CM | POA: Insufficient documentation

## 2013-02-22 DIAGNOSIS — Z79899 Other long term (current) drug therapy: Secondary | ICD-10-CM | POA: Insufficient documentation

## 2013-02-22 DIAGNOSIS — Z87440 Personal history of urinary (tract) infections: Secondary | ICD-10-CM | POA: Insufficient documentation

## 2013-02-22 DIAGNOSIS — I2510 Atherosclerotic heart disease of native coronary artery without angina pectoris: Secondary | ICD-10-CM | POA: Insufficient documentation

## 2013-02-22 DIAGNOSIS — K219 Gastro-esophageal reflux disease without esophagitis: Secondary | ICD-10-CM | POA: Insufficient documentation

## 2013-02-22 DIAGNOSIS — Z87891 Personal history of nicotine dependence: Secondary | ICD-10-CM | POA: Insufficient documentation

## 2013-02-22 DIAGNOSIS — Z7902 Long term (current) use of antithrombotics/antiplatelets: Secondary | ICD-10-CM | POA: Insufficient documentation

## 2013-02-22 DIAGNOSIS — Z792 Long term (current) use of antibiotics: Secondary | ICD-10-CM | POA: Insufficient documentation

## 2013-02-22 DIAGNOSIS — E119 Type 2 diabetes mellitus without complications: Secondary | ICD-10-CM | POA: Insufficient documentation

## 2013-02-22 DIAGNOSIS — M549 Dorsalgia, unspecified: Secondary | ICD-10-CM | POA: Insufficient documentation

## 2013-02-22 LAB — COMPREHENSIVE METABOLIC PANEL
Albumin: 3.9 g/dL (ref 3.5–5.2)
Alkaline Phosphatase: 66 U/L (ref 39–117)
BUN: 14 mg/dL (ref 6–23)
CO2: 25 mEq/L (ref 19–32)
Calcium: 10.1 mg/dL (ref 8.4–10.5)
Chloride: 105 mEq/L (ref 96–112)
GFR calc Af Amer: 77 mL/min — ABNORMAL LOW (ref >90)
Glucose, Bld: 93 mg/dL (ref 70–99)
Potassium: 5 mEq/L (ref 3.5–5.1)
Sodium: 141 mEq/L (ref 135–145)
Total Protein: 6.8 g/dL (ref 6.0–8.3)

## 2013-02-22 LAB — URINE MICROSCOPIC-ADD ON

## 2013-02-22 LAB — CBC WITH DIFFERENTIAL/PLATELET
Eosinophils Relative: 6 % — ABNORMAL HIGH (ref 0–5)
HCT: 37.7 % — ABNORMAL LOW (ref 39.0–52.0)
Hemoglobin: 12.9 g/dL — ABNORMAL LOW (ref 13.0–17.0)
Lymphocytes Relative: 33 % (ref 12–46)
Lymphs Abs: 2.7 10*3/uL (ref 0.7–4.0)
MCH: 32.3 pg (ref 26.0–34.0)
MCV: 94.3 fL (ref 78.0–100.0)
Monocytes Absolute: 0.7 10*3/uL (ref 0.1–1.0)
Monocytes Relative: 8 % (ref 3–12)
Neutro Abs: 4.3 10*3/uL (ref 1.7–7.7)
RDW: 12.9 % (ref 11.5–15.5)
WBC: 8.3 10*3/uL (ref 4.0–10.5)

## 2013-02-22 LAB — URINALYSIS, ROUTINE W REFLEX MICROSCOPIC
Bilirubin Urine: NEGATIVE
Glucose, UA: NEGATIVE mg/dL
Hgb urine dipstick: NEGATIVE
Ketones, ur: NEGATIVE mg/dL
Nitrite: NEGATIVE
Protein, ur: NEGATIVE mg/dL
Specific Gravity, Urine: 1.011 (ref 1.005–1.030)
Urobilinogen, UA: 0.2 mg/dL (ref 0.0–1.0)
pH: 6 (ref 5.0–8.0)

## 2013-02-22 MED ORDER — IOHEXOL 300 MG/ML  SOLN
80.0000 mL | Freq: Once | INTRAMUSCULAR | Status: AC | PRN
  Administered 2013-02-22: 80 mL via INTRAVENOUS

## 2013-02-22 MED ORDER — OXYCODONE-ACETAMINOPHEN 5-325 MG PO TABS
1.0000 | ORAL_TABLET | Freq: Once | ORAL | Status: AC
  Administered 2013-02-22: 1 via ORAL
  Filled 2013-02-22: qty 1

## 2013-02-22 MED ORDER — IOHEXOL 300 MG/ML  SOLN
25.0000 mL | Freq: Once | INTRAMUSCULAR | Status: AC | PRN
  Administered 2013-02-22: 25 mL via ORAL

## 2013-02-22 NOTE — ED Notes (Signed)
Pt drinking omnipaque for CT scan.

## 2013-02-22 NOTE — ED Notes (Signed)
Returned from CT, no change in status.  Reports pain is "good"  5/10

## 2013-02-22 NOTE — ED Provider Notes (Signed)
CSN: [id number]     Arrival date & time 02/22/13  1722 History   First MD Initiated Contact with Patient 02/22/13 1834     Chief Complaint  Patient presents with  . Abdominal Pain   (Consider location/radiation/quality/duration/timing/severity/associated sxs/prior Treatment) HPI Comments: 79 y/o male with RLQ/right hip pain. Pain present since July and constant. Made worse with walking and sitting. Denies fever, n/v/d, melena, hematochezia. Seen here 10/2 for same symptoms and diagnosed with a UTI, currently on macrobid. Denies urinary symptoms  The history is provided by the patient. No language interpreter was used.    Past Medical History  Diagnosis Date  . Arthritis   . Diabetes mellitus without complication   . GERD (gastroesophageal reflux disease)   . Hyperlipidemia   . Hypertension   . Coronary artery disease   . Carotid artery disease   . Peripheral arterial disease     50% ostial right common iliac artery stenosis without claudication   Past Surgical History  Procedure Laterality Date  . Cardiac catheterization      3 stints placed  . Colonoscopy     Family History  Problem Relation Age of Onset  . Heart disease Mother   . Heart disease Father    History  Substance Use Topics  . Smoking status: Former Smoker    Quit date: 05/18/1977  . Smokeless tobacco: Never Used  . Alcohol Use: No    Review of Systems  Constitutional: Negative for fever and appetite change.  Respiratory: Negative for chest tightness and shortness of breath.   Cardiovascular: Negative for chest pain.  Gastrointestinal: Positive for abdominal pain. Negative for nausea, vomiting, diarrhea, constipation, blood in stool and abdominal distention.  Genitourinary: Negative for dysuria, scrotal swelling and testicular pain.  Musculoskeletal: Positive for arthralgias and back pain. Negative for joint swelling.  Skin: Negative for color change.  All other systems reviewed and are  negative.    Allergies  Darvocet; Penicillins; and Propoxyphene and methadone  Home Medications   Current Outpatient Rx  Name  Route  Sig  Dispense  Refill  . ALPRAZolam (XANAX) 1 MG tablet   Oral   Take 1 mg by mouth 2 (two) times daily.           Marland Kitchen aspirin EC 81 MG tablet   Oral   Take 81 mg by mouth every morning.           . Calcium Carbonate-Vitamin D (CALCIUM 600/VITAMIN D) 600-400 MG-UNIT per tablet   Oral   Take 1 tablet by mouth daily.           . Cetirizine HCl 10 MG CAPS   Oral   Take 5 mg by mouth daily as needed (allergies).          . cholecalciferol (VITAMIN D) 400 UNITS TABS tablet   Oral   Take 400 Units by mouth daily.         . clopidogrel (PLAVIX) 75 MG tablet   Oral   Take 75 mg by mouth daily.           Marland Kitchen dexlansoprazole (DEXILANT) 60 MG capsule   Oral   Take 60 mg by mouth daily.           Tery Sanfilippo Calcium (STOOL SOFTENER PO)   Oral   Take 1 tablet by mouth as needed.         . ezetimibe-simvastatin (VYTORIN) 10-40 MG per tablet   Oral   Take 1 tablet  by mouth daily. Taking every 2 days         . Ferrous Sulfate (IRON) 28 MG TABS   Oral   Take 28 mg by mouth. Every 2 days         . HYDROcodone-acetaminophen (NORCO/VICODIN) 5-325 MG per tablet   Oral   Take 1 tablet by mouth every 4 (four) hours as needed for pain.   10 tablet   0   . isosorbide mononitrate (IMDUR) 30 MG 24 hr tablet   Oral   Take 15 mg by mouth at bedtime.           . metFORMIN (GLUCOPHAGE) 1000 MG tablet   Oral   Take 1,000 mg by mouth 2 (two) times daily with a meal.         . metoCLOPramide (REGLAN) 5 MG tablet   Oral   Take 1 tablet (5 mg total) by mouth 4 (four) times daily -  before meals and at bedtime.   120 tablet   2     Take 30 minutes before meals and at bedtime.   . metoprolol (TOPROL-XL) 50 MG 24 hr tablet   Oral   Take 25 mg by mouth every morning.          . Multiple Vitamins-Minerals (MULTIVITAMINS THER.  W/MINERALS) TABS   Oral   Take 1 tablet by mouth daily.           . niacin (NIASPAN) 750 MG CR tablet   Oral   Take 1,500 mg by mouth at bedtime. Taking 1,500mg  for 2 days and 750mg  for 2 days and repeat         . nitrofurantoin, macrocrystal-monohydrate, (MACROBID) 100 MG capsule   Oral   Take 1 capsule (100 mg total) by mouth 2 (two) times daily.   20 capsule   0   . OVER THE COUNTER MEDICATION               . PARoxetine (PAXIL) 20 MG tablet   Oral   Take 20 mg by mouth every evening.           . pregabalin (LYRICA) 75 MG capsule   Oral   Take 75 mg by mouth 2 (two) times daily.         . tamsulosin (FLOMAX) 0.4 MG CAPS   Oral   Take 0.4 mg by mouth daily after supper.           BP 171/74  Pulse 72  Temp(Src) 97.6 F (36.4 C) (Oral)  Resp 18  Ht 5\' 9"  (1.753 m)  Wt 165 lb 8 oz (75.07 kg)  BMI 24.43 kg/m2  SpO2 99% Physical Exam  Vitals reviewed. Constitutional: He is oriented to person, place, and time. He appears well-developed and well-nourished. No distress.  HENT:  Head: Normocephalic.  Cardiovascular: Normal rate, regular rhythm and normal heart sounds.   Pulmonary/Chest: Effort normal and breath sounds normal.  Abdominal: Soft. Bowel sounds are normal. He exhibits no distension. There is tenderness in the right lower quadrant. There is no rigidity, no rebound, no guarding and no CVA tenderness. No hernia. Hernia confirmed negative in the right inguinal area and confirmed negative in the left inguinal area.  Genitourinary: Testes normal and penis normal. Cremasteric reflex is present.  Musculoskeletal:       Right hip: He exhibits tenderness. He exhibits normal range of motion, normal strength, no swelling, no crepitus and no deformity.       Right  knee: Normal.       Lumbar back: He exhibits tenderness (right lumbar tenderness). He exhibits no bony tenderness and no deformity.  Neurological: He is alert and oriented to person, place, and time.  No sensory deficit.  Reflex Scores:      Patellar reflexes are 1+ on the right side and 1+ on the left side.      Achilles reflexes are 1+ on the right side and 1+ on the left side. 5/5 RLE and LLE strength  Skin: Skin is warm and dry.    ED Course  Procedures (including critical care time) Labs Review Labs Reviewed  URINALYSIS, ROUTINE W REFLEX MICROSCOPIC - Abnormal; Notable for the following:    Leukocytes, UA MODERATE (*)    All other components within normal limits  CBC WITH DIFFERENTIAL - Abnormal; Notable for the following:    RBC 4.00 (*)    Hemoglobin 12.9 (*)    HCT 37.7 (*)    Eosinophils Relative 6 (*)    All other components within normal limits  URINE MICROSCOPIC-ADD ON  COMPREHENSIVE METABOLIC PANEL   Imaging Review Dg Hip Complete Right  02/23/2013   *RADIOLOGY REPORT*  Clinical Data: Chronic right hip pain.  No injury.  RIGHT HIP - COMPLETE 2+ VIEW  Comparison: CT of the abdomen and pelvis February 22, 2013 at 2125 hours.  Findings: Femoral heads are well formed and located.  No dislocation.  No fracture deformity.  Sacroiliac joints are symmetric.  No destructive bony lesion.  Contrast seen in the ascending colon and urinary bladder due to recent CT.  IMPRESSION: No acute fracture deformity or dislocation.   Original Report Authenticated By: Awilda Metro   Ct Abdomen Pelvis W Contrast  02/22/2013   CLINICAL DATA:  Right lower quadrant pain  EXAM: CT ABDOMEN AND PELVIS WITH CONTRAST  TECHNIQUE: Multidetector CT imaging of the abdomen and pelvis was performed using the standard protocol following bolus administration of intravenous contrast.  CONTRAST:  80mL OMNIPAQUE IOHEXOL 300 MG/ML  SOLN  COMPARISON:  12/13/2012  FINDINGS: Lung bases are within normal limits.  The liver, spleen, adrenal glands and pancreas are within normal limits. The gallbladder is well distended. No definitive calculi are seen. Some densities are noted in the wall which may correspond to the  previous diagnosis of adenomyomatosis.  The kidneys are well visualized bilaterally and reveal cystic change. This is stable from the prior exam. No renal calculi or obstructive changes are noted. The appendix is air-filled and within normal limits. The bladder is partially distended. Diffuse prostatic calcifications are noted. No pelvic mass lesion or sidewall abnormality is seen. No acute bony abnormality is seen.  IMPRESSION: Chronic changes without acute abnormality.   Electronically Signed   By: Alcide Clever M.D.   On: 02/22/2013 21:43    MDM   1. Abdominal pain   2. Hip pain, acute, right    79 y/o male with history of DM, HTN, CAD, PVD presenting with RLQ/right hip/right lumbar back pain. No injury, n/v/d, fever. Able to bear weight but pain worse with walking. AFVSS. Recently evaluated for cholecystecomy, no RUQ tenderness. No peritonitis. Full ROM of hip without pain. CBC, CMP, UA unremarkable. CT Abdomen/pelvis without acute abnormality. Diagnosis considered and unlikely: appendicitis, AAA, colitis, diverticulosis, hernia, testicular torsion, bowel perforation. Suspect osteoarthritis. XR hip obtained with no acute injury. Will refer to Orthopedics. Pain control. Patient voiced understanding of return instructions.   Labs and imaging reviewed in my medical decision making if  ordered. Patient discussed with my attending, Dr. Doreatha Martin, MD 02/23/13 [phone]

## 2013-02-22 NOTE — ED Notes (Signed)
Pt alert, daughter at bedside.  Slow to answer questions and somewhat vague.  Currently states pain is "ok"  Skin pink, wm, dry.  VSS

## 2013-02-22 NOTE — ED Notes (Signed)
CT notified pt has completed oral contrast.  

## 2013-02-22 NOTE — ED Notes (Signed)
Present with lower right quadrant abdominal pain, reports most of the pain is on the right side at the hip, pain is worse with walking. Pain began in July, denies nausea and vomiting, diarrhea. No deformity to hip. Denies injury.  Described as constant hurt.

## 2013-02-23 MED ORDER — OXYCODONE-ACETAMINOPHEN 5-325 MG PO TABS: 1.0000 | ORAL_TABLET | ORAL | Status: AC | PRN

## 2013-02-23 NOTE — ED Provider Notes (Signed)
I saw and evaluated the patient, reviewed the resident's note and I agree with the findings and plan. Pt with nonspecific RLQ/hip pain worse with weight bearing that has been worsening for months.  REcent U/s neg for acute gallbladder pathology and not related to food.  Pt had CT today to r/o masses, prostate issues.  Still on abx for UTI which appears to be improving.  Feel this is MSK either hip or back.  Discussed with fmaily and will have f/u.  Gwyneth Sprout, MD 02/23/13 [phone]

## 2013-03-03 ENCOUNTER — Other Ambulatory Visit (HOSPITAL_COMMUNITY): Payer: Self-pay | Admitting: Cardiovascular Disease

## 2013-03-16 ENCOUNTER — Other Ambulatory Visit (HOSPITAL_COMMUNITY): Payer: Self-pay | Admitting: Family Medicine

## 2013-03-16 ENCOUNTER — Other Ambulatory Visit (HOSPITAL_COMMUNITY): Payer: Self-pay | Admitting: Cardiovascular Disease

## 2013-03-16 ENCOUNTER — Ambulatory Visit (HOSPITAL_COMMUNITY)
Admission: RE | Admit: 2013-03-16 | Discharge: 2013-03-16 | Disposition: A | Discharge status: Home / Self Care | Payer: PRIVATE HEALTH INSURANCE | Source: Ambulatory Visit | Attending: Cardiovascular Disease | Admitting: Cardiovascular Disease

## 2013-03-16 DIAGNOSIS — I739 Peripheral vascular disease, unspecified: Secondary | ICD-10-CM

## 2013-03-16 DIAGNOSIS — M79609 Pain in unspecified limb: Secondary | ICD-10-CM

## 2013-03-16 DIAGNOSIS — I70219 Atherosclerosis of native arteries of extremities with intermittent claudication, unspecified extremity: Secondary | ICD-10-CM

## 2013-03-16 NOTE — Progress Notes (Signed)
Arterial Lower Ext. Duplex Completed. Dazja Houchin, BS, RDMS, RVT  

## 2013-03-23 ENCOUNTER — Encounter (HOSPITAL_COMMUNITY): Payer: Self-pay | Admitting: Pharmacy Technician

## 2013-03-25 NOTE — Pre-Procedure Instructions (Signed)
Marvin Leon  03/25/2013   Your procedure is scheduled on:  November 21  Report to [facility] Entrance "A" [address] at [employer] AM.  Call this number if you have problems the morning of surgery: [phone]   Remember:   Do not eat food or drink liquids after midnight.   Take these medicines the morning of surgery with A SIP OF WATER: Xanax (if needed), Zyrtec, Dexilant, Colace, Isosorbide, Metoprolol, Lyrica, Flomax   STOP Niacin, Multiple Vitamins, Plavix, Vitamin D, Calcium, Aspirin Friday November 14   Do not wear jewelry, make-up or nail polish.  Do not wear lotions, powders, or perfumes. You may wear deodorant.  Do not shave 48 hours prior to surgery. Men may shave face and neck.  Do not bring valuables to the hospital.  [facility] is not responsible                  for any belongings or valuables.               Contacts, dentures or bridgework may not be worn into surgery.  Leave suitcase in the car. After surgery it may be brought to your room.  For patients admitted to the hospital, discharge time is determined by your                treatment team.               Patients discharged the day of surgery will not be allowed to drive  home.  Name and phone number of your driver: Family/ Friend  Special Instructions: Shower using CHG 2 nights before surgery and the night before surgery.  If you shower the day of surgery use CHG.  Use special wash - you have one bottle of CHG for all showers.  You should use approximately 1/3 of the bottle for each shower.   Please read over the following fact sheets that you were given: Pain Booklet, Coughing and Deep Breathing and Surgical Site Infection Prevention

## 2013-03-26 ENCOUNTER — Encounter: Payer: Self-pay | Admitting: *Deleted

## 2013-03-26 ENCOUNTER — Telehealth: Payer: Self-pay | Admitting: *Deleted

## 2013-03-26 DIAGNOSIS — I739 Peripheral vascular disease, unspecified: Secondary | ICD-10-CM

## 2013-03-26 NOTE — Telephone Encounter (Signed)
Order placed for repeat lower ext dopplers in 1 year 

## 2013-03-26 NOTE — Telephone Encounter (Signed)
Message copied by Marella Bile on Sun Mar 26, 2013  7:52 PM ------      Message from: [name]      Created: Thu Mar 23, 2013  1:17 PM       Mild increase in right iliac velocities. Repeat in 12 months ------

## 2013-03-27 ENCOUNTER — Encounter (HOSPITAL_COMMUNITY)
Admission: RE | Admit: 2013-03-27 | Discharge: 2013-03-27 | Disposition: A | Discharge status: Home / Self Care | Payer: PRIVATE HEALTH INSURANCE | Source: Ambulatory Visit | Attending: General Surgery | Admitting: General Surgery

## 2013-03-27 ENCOUNTER — Encounter (HOSPITAL_COMMUNITY): Payer: Self-pay

## 2013-03-27 ENCOUNTER — Encounter (HOSPITAL_COMMUNITY)
Admission: RE | Admit: 2013-03-27 | Discharge: 2013-03-27 | Disposition: A | Discharge status: Home / Self Care | Payer: PRIVATE HEALTH INSURANCE | Source: Ambulatory Visit | Attending: Anesthesiology | Admitting: Anesthesiology

## 2013-03-27 DIAGNOSIS — Z01818 Encounter for other preprocedural examination: Secondary | ICD-10-CM | POA: Insufficient documentation

## 2013-03-27 DIAGNOSIS — Z01812 Encounter for preprocedural laboratory examination: Secondary | ICD-10-CM | POA: Insufficient documentation

## 2013-03-27 HISTORY — DX: Emphysema, unspecified: J43.9

## 2013-03-27 LAB — COMPREHENSIVE METABOLIC PANEL
Alkaline Phosphatase: 67 U/L (ref 39–117)
BUN: 15 mg/dL (ref 6–23)
CO2: 26 mEq/L (ref 19–32)
Calcium: 10.5 mg/dL (ref 8.4–10.5)
Creatinine, Ser: 1.08 mg/dL (ref 0.50–1.35)
GFR calc Af Amer: 74 mL/min — ABNORMAL LOW (ref >90)
GFR calc non Af Amer: 64 mL/min — ABNORMAL LOW (ref >90)
Glucose, Bld: 105 mg/dL — ABNORMAL HIGH (ref 70–99)
Potassium: 4.9 mEq/L (ref 3.5–5.1)
Total Protein: 6.6 g/dL (ref 6.0–8.3)

## 2013-03-27 LAB — CBC
HCT: 39.6 % (ref 39.0–52.0)
Hemoglobin: 13.5 g/dL (ref 13.0–17.0)
MCH: 32.6 pg (ref 26.0–34.0)
MCHC: 34.1 g/dL (ref 30.0–36.0)
MCV: 95.7 fL (ref 78.0–100.0)
RBC: 4.14 MIL/uL — ABNORMAL LOW (ref 4.22–5.81)
RDW: 13.1 % (ref 11.5–15.5)

## 2013-03-27 NOTE — Progress Notes (Signed)
03/27/13 1008  OBSTRUCTIVE SLEEP APNEA  Have you ever been diagnosed with sleep apnea through a sleep study? No  Do you snore loudly (loud enough to be heard through closed doors)?  1  Do you often feel tired, fatigued, or sleepy during the daytime? 1  Has anyone observed you stop breathing during your sleep? 0  Do you have, or are you being treated for high blood pressure? 1  BMI more than 35 kg/m2? 0  Age over 77 years old? 1  Neck circumference greater than 40 cm/18 inches? 0  Gender: 1  Obstructive Sleep Apnea Score 5  Score 4 or greater  Results sent to PCP

## 2013-03-30 ENCOUNTER — Encounter: Payer: Self-pay | Admitting: Nurse Practitioner

## 2013-03-30 ENCOUNTER — Encounter (INDEPENDENT_AMBULATORY_CARE_PROVIDER_SITE_OTHER): Payer: Self-pay

## 2013-03-30 ENCOUNTER — Ambulatory Visit (INDEPENDENT_AMBULATORY_CARE_PROVIDER_SITE_OTHER): Payer: Medicare Other | Admitting: Nurse Practitioner

## 2013-03-30 VITALS — BP 113/67 | HR 78 | Temp 98.1°F | Ht 69.0 in | Wt 168.0 lb

## 2013-03-30 DIAGNOSIS — R42 Dizziness and giddiness: Secondary | ICD-10-CM

## 2013-03-30 NOTE — Progress Notes (Signed)
PATIENT: Marvin Leon DOB: [birth year]   REASON FOR VISIT: follow up for peripheral neuropathy and dizziness HISTORY FROM: patient  HISTORY OF PRESENT ILLNESS: 79 year old male with diabetic peripheral neuropathy with chronic dizziness likely multifactorial due to combination of diabetic neuropathy as well as orthostatic symptoms.   12/26/2012 (PS): he returns followup today after last visit on 10/24/10. He continues intermittent dizziness which he describes as a feeling of imbalance particularly when is was getting up or walking or making a sudden movement. He denies any vertigo, blurred vision. He had he has had a few falls but no major injury. He uses a cane for short distances on a walker for long distances. He states his diabetes control has been better after medication changes by his primary physician and following a new diet. His fasting sugars range in the 110-120 range. He continues to have burning and paresthesias in his feet and takes Lyrica 75 twice a day which is tolerating quite well. The burning in the feet seems to be more with increased walking.   UPDATE 03/30/13 (LL):  Patient returns for follow up for dizziness.  He plans to have elective laparoscopic cholecystectomy on November 21 and would like medical clearance.  He has been doing well, following a carb-modified diet, blood sugars are under good control.  Lyrica is very helpful for the burning in his feet, takes it BID.  He has been using the foot-pumping exercises that Dr. Pearlean Brownie recommended before he gets up and has not had any falls.  ROS:  14 system review of systems is positive for weight loss, hearing loss, ringing in ears, easy bruising, headache, dizziness, anxiety, disinterest in activities, snoring and sleepiness.  ALLERGIES: Allergies  Allergen Reactions  . Darvocet [Propoxyphene-Acetaminophen] Nausea And Vomiting  . Penicillins Other (See Comments)    Reaction to penicillin and darvocet unknown  .  Propoxyphene And Methadone Other (See Comments)    HOME MEDICATIONS: Outpatient Prescriptions Prior to Visit  Medication Sig Dispense Refill  . acidophilus (RISAQUAD) CAPS capsule Take 1 capsule by mouth daily.      Marland Kitchen ALPRAZolam (XANAX) 1 MG tablet Take 1 mg by mouth 2 (two) times daily.        Marland Kitchen aspirin EC 81 MG tablet Take 81 mg by mouth every morning.        . Calcium Carbonate-Vitamin D (CALCIUM 600/VITAMIN D) 600-400 MG-UNIT per tablet Take 1 tablet by mouth 2 (two) times daily.       . Cetirizine HCl 10 MG CAPS Take 10 mg by mouth every other day as needed (for allergies).       . cholecalciferol (VITAMIN D) 400 UNITS TABS tablet Take 400 Units by mouth 2 (two) times daily.       . clopidogrel (PLAVIX) 75 MG tablet Take 75 mg by mouth daily.        Marland Kitchen dexlansoprazole (DEXILANT) 60 MG capsule Take 60 mg by mouth daily before breakfast.       . docusate sodium (COLACE) 100 MG capsule Take 100 mg by mouth daily.      Marland Kitchen ezetimibe-simvastatin (VYTORIN) 10-40 MG per tablet Take 1 tablet by mouth every other day.       . Ferrous Sulfate (IRON) 28 MG TABS Take 28 mg by mouth every other day.       . isosorbide mononitrate (IMDUR) 30 MG 24 hr tablet Take 15 mg by mouth at bedtime.        . metFORMIN (GLUCOPHAGE)  1000 MG tablet Take 1,000 mg by mouth 2 (two) times daily with a meal.      . metoprolol (TOPROL-XL) 50 MG 24 hr tablet Take 25 mg by mouth every morning.       . Multiple Vitamins-Minerals (MULTIVITAMINS THER. W/MINERALS) TABS Take 1 tablet by mouth daily.        . niacin (NIASPAN) 750 MG CR tablet Take 750 mg by mouth 2 (two) times daily.       Marland Kitchen PARoxetine (PAXIL) 20 MG tablet Take 20 mg by mouth every evening.        . pregabalin (LYRICA) 75 MG capsule Take 75 mg by mouth 2 (two) times daily.      . tamsulosin (FLOMAX) 0.4 MG CAPS Take 0.4 mg by mouth daily after supper.        No facility-administered medications prior to visit.    PAST MEDICAL HISTORY: Past Medical History    Diagnosis Date  . Arthritis   . Diabetes mellitus without complication   . GERD (gastroesophageal reflux disease)   . Hyperlipidemia   . Hypertension   . Coronary artery disease   . Carotid artery disease   . Peripheral arterial disease     50% ostial right common iliac artery stenosis without claudication  . Emphysema     PAST SURGICAL HISTORY: Past Surgical History  Procedure Laterality Date  . Colonoscopy    . Cardiac catheterization      3 stents placed    FAMILY HISTORY: Family History  Problem Relation Age of Onset  . Heart disease Mother   . Heart disease Father     SOCIAL HISTORY: History   Social History  . Marital Status: Married    Spouse Name: N/A    Number of Children: 4  . Years of Education: 8th   Occupational History  .     Social History Main Topics  . Smoking status: Former Smoker -- 3.00 packs/day for 20 years    Quit date: 05/18/1977  . Smokeless tobacco: Never Used  . Alcohol Use: No  . Drug Use: No  . Sexual Activity: No   Other Topics Concern  . Not on file   Social History Narrative   His father is deceased from a heart attack at age 59. His mother is deceased from congestive heart failure at age 3.  His mother also had rectal cancer, heart disease and diabetes. There is heart disease, diabetes and lung disease in his brothers. There is lung disease and psychological problems in a sister.     PHYSICAL EXAM  Filed Vitals:   03/30/13 1409  BP: 113/67  Pulse: 78  Temp: 98.1 F (36.7 C)  TempSrc: Oral  Height: 5\' 9"  (1.753 m)  Weight: 168 lb (76.204 kg)   Body mass index is 24.8 kg/(m^2).  Physical Exam  General: well developed, well nourished, seated, in no evident distress  Head: head normocephalic and atraumatic. Orohparynx benign  Neck: supple with no carotid or supraclavicular bruits  Cardiovascular: regular rate and rhythm, no murmurs  Musculoskeletal: no deformity  Skin: no rash/petichiae  Vascular: Normal  pulses all extremities   Neurologic Exam  Mental Status: Awake and fully alert. Oriented to place and time. Recent and remote memory intact. Attention span, concentration and fund of knowledge appropriate. Mood and affect appropriate.  Cranial Nerves: Fundoscopic exam reveals sharp disc margins. Pupils equal, briskly reactive to light. Extraocular movements full without nystagmus. Visual fields full to confrontation. Hearing intact. Facial  sensation intact. Face, tongue, palate moves normally and symmetrically.  Motor: Normal bulk and tone. Normal strength in all tested extremity muscles.  Sensory: Diminished touch, pinprick and vibration sensation from ankle down.  Coordination:  Rapid alternating movements normal in all extremities. Finger-to-nose and heel-to-shin performed accurately bilaterally.  Gait and Station: Arises from chair without difficulty. Stance is normal. Gait demonstrates normal stride length and balance . Unsteady while standing on a narrow base. Unable to heel, toe and tandem walk without difficulty. Ambulates with cane. Reflexes: 1+ and symmetric except ankle jerks are depressed. Toes downgoing.   DIAGNOSTIC DATA (LABS, IMAGING, TESTING) - I reviewed patient records, labs, notes, testing and imaging myself where available.  Lab Results  Component Value Date   WBC 7.9 03/27/2013   HGB 13.5 03/27/2013   HCT 39.6 03/27/2013   MCV 95.7 03/27/2013   PLT 159 03/27/2013      Component Value Date/Time   NA 140 03/27/2013 1033   K 4.9 03/27/2013 1033   CL 104 03/27/2013 1033   CO2 26 03/27/2013 1033   GLUCOSE 105* 03/27/2013 1033   BUN 15 03/27/2013 1033   CREATININE 1.08 03/27/2013 1033   CALCIUM 10.5 03/27/2013 1033   PROT 6.6 03/27/2013 1033   ALBUMIN 4.0 03/27/2013 1033   AST 18 03/27/2013 1033   ALT 12 03/27/2013 1033   ALKPHOS 67 03/27/2013 1033   BILITOT 0.3 03/27/2013 1033   GFRNONAA 64* 03/27/2013 1033   GFRAA 74* 03/27/2013 1033    ASSESSMENT AND  PLAN 79 year old male with diabetic peripheral neuropathy with chronic dizziness likely multifactorial due to combination of diabetic neuropathy as well as orthostatic symptoms.   PLAN:  He was advised to do orthostatic tolerance exercises before getting up and continue Lyrica 75 mg twice daily for his neuropathic pain.  He plans to have elective laparoscopic cholecystectomy with Dr. Carolynne Edouard on November 21, he may have medical clearance. Return for followup in 6-12 months, sooner as needed.  Ronal Fear, MSN, NP-C 03/30/2013, 2:36 PM [facility] [address], [address] [place], [place] [address] [phone]  Note: This document was prepared with digital dictation and possible smart phrase technology. Any transcriptional errors that result from this process are unintentional.

## 2013-04-06 MED ORDER — CIPROFLOXACIN IN D5W 400 MG/200ML IV SOLN
400.0000 mg | INTRAVENOUS | Status: AC
  Administered 2013-04-07: 400 mg via INTRAVENOUS
  Filled 2013-04-06: qty 200

## 2013-04-07 ENCOUNTER — Encounter (HOSPITAL_COMMUNITY)
Admission: RE | Disposition: A | Discharge status: Home / Self Care | Payer: Self-pay | Source: Ambulatory Visit | Attending: General Surgery

## 2013-04-07 ENCOUNTER — Ambulatory Visit (HOSPITAL_COMMUNITY)
Admission: RE | Admit: 2013-04-07 | Discharge: 2013-04-08 | Disposition: A | Discharge status: Home / Self Care | Payer: PRIVATE HEALTH INSURANCE | Source: Ambulatory Visit | Attending: General Surgery | Admitting: General Surgery

## 2013-04-07 ENCOUNTER — Encounter (HOSPITAL_COMMUNITY): Payer: PRIVATE HEALTH INSURANCE | Admitting: Certified Registered"

## 2013-04-07 ENCOUNTER — Ambulatory Visit (HOSPITAL_COMMUNITY): Payer: PRIVATE HEALTH INSURANCE

## 2013-04-07 ENCOUNTER — Encounter (HOSPITAL_COMMUNITY): Payer: Self-pay | Admitting: *Deleted

## 2013-04-07 ENCOUNTER — Ambulatory Visit (HOSPITAL_COMMUNITY): Payer: PRIVATE HEALTH INSURANCE | Admitting: Certified Registered"

## 2013-04-07 DIAGNOSIS — K811 Chronic cholecystitis: Secondary | ICD-10-CM

## 2013-04-07 DIAGNOSIS — J4489 Other specified chronic obstructive pulmonary disease: Secondary | ICD-10-CM | POA: Insufficient documentation

## 2013-04-07 DIAGNOSIS — K219 Gastro-esophageal reflux disease without esophagitis: Secondary | ICD-10-CM | POA: Insufficient documentation

## 2013-04-07 DIAGNOSIS — Z87891 Personal history of nicotine dependence: Secondary | ICD-10-CM | POA: Insufficient documentation

## 2013-04-07 DIAGNOSIS — I2510 Atherosclerotic heart disease of native coronary artery without angina pectoris: Secondary | ICD-10-CM | POA: Insufficient documentation

## 2013-04-07 DIAGNOSIS — K8010 Calculus of gallbladder with chronic cholecystitis without obstruction: Secondary | ICD-10-CM | POA: Insufficient documentation

## 2013-04-07 DIAGNOSIS — J449 Chronic obstructive pulmonary disease, unspecified: Secondary | ICD-10-CM | POA: Insufficient documentation

## 2013-04-07 DIAGNOSIS — I10 Essential (primary) hypertension: Secondary | ICD-10-CM | POA: Insufficient documentation

## 2013-04-07 DIAGNOSIS — I739 Peripheral vascular disease, unspecified: Secondary | ICD-10-CM | POA: Insufficient documentation

## 2013-04-07 HISTORY — PX: CHOLECYSTECTOMY: SHX55

## 2013-04-07 LAB — GLUCOSE, CAPILLARY
Glucose-Capillary: 70 mg/dL (ref 70–99)
Glucose-Capillary: 121 mg/dL — ABNORMAL HIGH (ref 70–99)

## 2013-04-07 SURGERY — LAPAROSCOPIC CHOLECYSTECTOMY WITH INTRAOPERATIVE CHOLANGIOGRAM
Anesthesia: General | Wound class: Contaminated

## 2013-04-07 MED ORDER — METFORMIN HCL 500 MG PO TABS
1000.0000 mg | ORAL_TABLET | Freq: Two times a day (BID) | ORAL | Status: DC
  Administered 2013-04-07 – 2013-04-08 (×2): 1000 mg via ORAL
  Filled 2013-04-07 (×4): qty 2

## 2013-04-07 MED ORDER — ONDANSETRON HCL 4 MG/2ML IJ SOLN
INTRAMUSCULAR | Status: DC | PRN
  Administered 2013-04-07: 4 mg via INTRAVENOUS

## 2013-04-07 MED ORDER — ARTIFICIAL TEARS OP OINT
TOPICAL_OINTMENT | OPHTHALMIC | Status: DC | PRN
  Administered 2013-04-07: 1 via OPHTHALMIC

## 2013-04-07 MED ORDER — METFORMIN HCL 500 MG PO TABS: 1000.0000 mg | ORAL_TABLET | Freq: Two times a day (BID) | ORAL | Status: DC

## 2013-04-07 MED ORDER — GLYCOPYRROLATE 0.2 MG/ML IJ SOLN
INTRAMUSCULAR | Status: DC | PRN
  Administered 2013-04-07: 0.6 mg via INTRAVENOUS

## 2013-04-07 MED ORDER — DEXTROSE 50 % IV SOLN
25.0000 mL | Freq: Once | INTRAVENOUS | Status: AC
  Administered 2013-04-07: 25 mL via INTRAVENOUS
  Filled 2013-04-07: qty 50

## 2013-04-07 MED ORDER — ONDANSETRON HCL 4 MG/2ML IJ SOLN: 4.0000 mg | Freq: Four times a day (QID) | INTRAMUSCULAR | Status: DC | PRN

## 2013-04-07 MED ORDER — ASPIRIN EC 81 MG PO TBEC
81.0000 mg | DELAYED_RELEASE_TABLET | Freq: Every day | ORAL | Status: DC
  Administered 2013-04-08: 81 mg via ORAL
  Filled 2013-04-07: qty 1

## 2013-04-07 MED ORDER — CHLORHEXIDINE GLUCONATE 4 % EX LIQD: 1.0000 "application " | Freq: Once | CUTANEOUS | Status: DC

## 2013-04-07 MED ORDER — KCL IN DEXTROSE-NACL 20-5-0.9 MEQ/L-%-% IV SOLN
INTRAVENOUS | Status: DC
  Filled 2013-04-07: qty 1000

## 2013-04-07 MED ORDER — INSULIN ASPART 100 UNIT/ML ~~LOC~~ SOLN: 0.0000 [IU] | Freq: Every day | SUBCUTANEOUS | Status: DC

## 2013-04-07 MED ORDER — SODIUM CHLORIDE 0.9 % IR SOLN
Status: DC | PRN
  Administered 2013-04-07: 1000 mL

## 2013-04-07 MED ORDER — ONDANSETRON HCL 4 MG PO TABS: 4.0000 mg | ORAL_TABLET | Freq: Four times a day (QID) | ORAL | Status: DC | PRN

## 2013-04-07 MED ORDER — NIACIN ER (ANTIHYPERLIPIDEMIC) 750 MG PO TBCR: 750.0000 mg | EXTENDED_RELEASE_TABLET | Freq: Two times a day (BID) | ORAL | Status: DC

## 2013-04-07 MED ORDER — PANTOPRAZOLE SODIUM 40 MG PO TBEC
40.0000 mg | DELAYED_RELEASE_TABLET | Freq: Every day | ORAL | Status: DC
  Administered 2013-04-07 – 2013-04-08 (×2): 40 mg via ORAL
  Filled 2013-04-07 (×2): qty 1

## 2013-04-07 MED ORDER — PROPOFOL 10 MG/ML IV BOLUS
INTRAVENOUS | Status: DC | PRN
  Administered 2013-04-07: 30 mg via INTRAVENOUS
  Administered 2013-04-07: 150 mg via INTRAVENOUS

## 2013-04-07 MED ORDER — FENTANYL CITRATE 0.05 MG/ML IJ SOLN
INTRAMUSCULAR | Status: DC | PRN
  Administered 2013-04-07: 100 ug via INTRAVENOUS
  Administered 2013-04-07 (×3): 50 ug via INTRAVENOUS

## 2013-04-07 MED ORDER — LIDOCAINE HCL (CARDIAC) 20 MG/ML IV SOLN
INTRAVENOUS | Status: DC | PRN
  Administered 2013-04-07: 70 mg via INTRAVENOUS

## 2013-04-07 MED ORDER — HYDROMORPHONE HCL PF 1 MG/ML IJ SOLN
INTRAMUSCULAR | Status: AC
  Filled 2013-04-07: qty 1

## 2013-04-07 MED ORDER — EZETIMIBE-SIMVASTATIN 10-40 MG PO TABS
1.0000 | ORAL_TABLET | ORAL | Status: DC
  Administered 2013-04-07: 1 via ORAL
  Filled 2013-04-07: qty 1

## 2013-04-07 MED ORDER — MORPHINE SULFATE 4 MG/ML IJ SOLN: 4.0000 mg | INTRAMUSCULAR | Status: DC | PRN

## 2013-04-07 MED ORDER — SODIUM CHLORIDE 0.9 % IV SOLN
INTRAVENOUS | Status: DC | PRN
  Administered 2013-04-07: 10:00:00

## 2013-04-07 MED ORDER — OXYCODONE-ACETAMINOPHEN 5-325 MG PO TABS
1.0000 | ORAL_TABLET | ORAL | Status: DC | PRN
  Administered 2013-04-07 – 2013-04-08 (×4): 1 via ORAL
  Filled 2013-04-07 (×4): qty 1

## 2013-04-07 MED ORDER — INSULIN ASPART 100 UNIT/ML ~~LOC~~ SOLN
0.0000 [IU] | Freq: Three times a day (TID) | SUBCUTANEOUS | Status: DC
  Administered 2013-04-07: 1 [IU] via SUBCUTANEOUS

## 2013-04-07 MED ORDER — DEXTROSE 50 % IV SOLN
INTRAVENOUS | Status: AC
  Filled 2013-04-07: qty 50

## 2013-04-07 MED ORDER — HEPARIN SODIUM (PORCINE) 5000 UNIT/ML IJ SOLN
5000.0000 [IU] | Freq: Three times a day (TID) | INTRAMUSCULAR | Status: DC
  Administered 2013-04-08: 5000 [IU] via SUBCUTANEOUS
  Filled 2013-04-07 (×4): qty 1

## 2013-04-07 MED ORDER — ROCURONIUM BROMIDE 100 MG/10ML IV SOLN
INTRAVENOUS | Status: DC | PRN
  Administered 2013-04-07: 40 mg via INTRAVENOUS

## 2013-04-07 MED ORDER — NEOSTIGMINE METHYLSULFATE 1 MG/ML IJ SOLN
INTRAMUSCULAR | Status: DC | PRN
  Administered 2013-04-07: 4 mg via INTRAVENOUS

## 2013-04-07 MED ORDER — LACTATED RINGERS IV SOLN
INTRAVENOUS | Status: DC
  Administered 2013-04-07: 08:00:00 via INTRAVENOUS

## 2013-04-07 MED ORDER — ISOSORBIDE MONONITRATE 15 MG HALF TABLET
15.0000 mg | ORAL_TABLET | Freq: Every day | ORAL | Status: DC
  Administered 2013-04-07: 15 mg via ORAL
  Filled 2013-04-07 (×2): qty 1

## 2013-04-07 MED ORDER — HYDROMORPHONE HCL PF 1 MG/ML IJ SOLN
0.2500 mg | INTRAMUSCULAR | Status: DC | PRN
  Administered 2013-04-07: 0.25 mg via INTRAVENOUS
  Administered 2013-04-07: 0.5 mg via INTRAVENOUS
  Administered 2013-04-07: 0.25 mg via INTRAVENOUS

## 2013-04-07 MED ORDER — PREGABALIN 75 MG PO CAPS
75.0000 mg | ORAL_CAPSULE | Freq: Two times a day (BID) | ORAL | Status: DC
  Administered 2013-04-07 – 2013-04-08 (×2): 75 mg via ORAL
  Filled 2013-04-07 (×2): qty 1

## 2013-04-07 MED ORDER — PAROXETINE HCL 20 MG PO TABS
20.0000 mg | ORAL_TABLET | Freq: Every evening | ORAL | Status: DC
  Administered 2013-04-07: 20 mg via ORAL
  Filled 2013-04-07 (×2): qty 1

## 2013-04-07 MED ORDER — METOPROLOL SUCCINATE ER 25 MG PO TB24
25.0000 mg | ORAL_TABLET | Freq: Every day | ORAL | Status: DC
  Administered 2013-04-07 – 2013-04-08 (×2): 25 mg via ORAL
  Filled 2013-04-07 (×3): qty 1

## 2013-04-07 MED ORDER — DOCUSATE SODIUM 100 MG PO CAPS
100.0000 mg | ORAL_CAPSULE | Freq: Every day | ORAL | Status: DC
  Administered 2013-04-07 – 2013-04-08 (×2): 100 mg via ORAL
  Filled 2013-04-07: qty 1

## 2013-04-07 MED ORDER — CLOPIDOGREL BISULFATE 75 MG PO TABS
75.0000 mg | ORAL_TABLET | Freq: Every day | ORAL | Status: DC
  Administered 2013-04-08: 75 mg via ORAL
  Filled 2013-04-07: qty 1

## 2013-04-07 MED ORDER — 0.9 % SODIUM CHLORIDE (POUR BTL) OPTIME
TOPICAL | Status: DC | PRN
  Administered 2013-04-07: 1000 mL

## 2013-04-07 MED ORDER — ALPRAZOLAM 0.5 MG PO TABS
1.0000 mg | ORAL_TABLET | Freq: Two times a day (BID) | ORAL | Status: DC
  Administered 2013-04-07 – 2013-04-08 (×2): 1 mg via ORAL
  Filled 2013-04-07 (×2): qty 2

## 2013-04-07 MED ORDER — EPHEDRINE SULFATE 50 MG/ML IJ SOLN
INTRAMUSCULAR | Status: DC | PRN
  Administered 2013-04-07: 10 mg via INTRAVENOUS

## 2013-04-07 MED ORDER — BUPIVACAINE-EPINEPHRINE 0.25% -1:200000 IJ SOLN
INTRAMUSCULAR | Status: DC | PRN
  Administered 2013-04-07: 17 mL

## 2013-04-07 MED ORDER — NIACIN ER 250 MG PO CPCR
750.0000 mg | ORAL_CAPSULE | Freq: Two times a day (BID) | ORAL | Status: DC
  Administered 2013-04-07 – 2013-04-08 (×2): 750 mg via ORAL
  Filled 2013-04-07 (×4): qty 1

## 2013-04-07 MED ORDER — TAMSULOSIN HCL 0.4 MG PO CAPS
0.4000 mg | ORAL_CAPSULE | Freq: Every day | ORAL | Status: DC
  Administered 2013-04-07: 0.4 mg via ORAL
  Filled 2013-04-07 (×2): qty 1

## 2013-04-07 SURGICAL SUPPLY — 43 items
ADH SKN CLS APL DERMABOND .7 (GAUZE/BANDAGES/DRESSINGS) ×1
APPLIER CLIP ROT 10 11.4 M/L (STAPLE) ×2
APR CLP MED LRG 11.4X10 (STAPLE) ×1
BAG SPEC RTRVL LRG 6X4 10 (ENDOMECHANICALS) ×1
BLADE SURG ROTATE 9660 (MISCELLANEOUS) ×1 IMPLANT
CANISTER SUCTION 2500CC (MISCELLANEOUS) ×2 IMPLANT
CATH REDDICK CHOLANGI 4FR 50CM (CATHETERS) ×2 IMPLANT
CHLORAPREP W/TINT 26ML (MISCELLANEOUS) ×2 IMPLANT
CLIP APPLIE ROT 10 11.4 M/L (STAPLE) ×1 IMPLANT
COVER MAYO STAND STRL (DRAPES) ×2 IMPLANT
COVER SURGICAL LIGHT HANDLE (MISCELLANEOUS) ×2 IMPLANT
DECANTER SPIKE VIAL GLASS SM (MISCELLANEOUS) ×4 IMPLANT
DERMABOND ADVANCED (GAUZE/BANDAGES/DRESSINGS) ×1
DERMABOND ADVANCED .7 DNX12 (GAUZE/BANDAGES/DRESSINGS) ×1 IMPLANT
DRAPE C-ARM 42X72 X-RAY (DRAPES) ×2 IMPLANT
DRAPE UTILITY 15X26 W/TAPE STR (DRAPE) ×4 IMPLANT
ELECT REM PT RETURN 9FT ADLT (ELECTROSURGICAL) ×2
ELECTRODE REM PT RTRN 9FT ADLT (ELECTROSURGICAL) ×1 IMPLANT
GLOVE BIO SURGEON STRL SZ7 (GLOVE) ×1 IMPLANT
GLOVE BIO SURGEON STRL SZ7.5 (GLOVE) ×3 IMPLANT
GLOVE BIOGEL PI IND STRL 7.0 (GLOVE) IMPLANT
GLOVE BIOGEL PI INDICATOR 7.0 (GLOVE) ×3
GLOVE SS BIOGEL STRL SZ 6.5 (GLOVE) IMPLANT
GLOVE SUPERSENSE BIOGEL SZ 6.5 (GLOVE) ×1
GLOVE SURG SS PI 7.0 STRL IVOR (GLOVE) ×1 IMPLANT
GOWN STRL NON-REIN LRG LVL3 (GOWN DISPOSABLE) ×8 IMPLANT
IV CATH 14GX2 1/4 (CATHETERS) ×2 IMPLANT
KIT BASIN OR (CUSTOM PROCEDURE TRAY) ×2 IMPLANT
KIT ROOM TURNOVER OR (KITS) ×2 IMPLANT
NS IRRIG 1000ML POUR BTL (IV SOLUTION) ×2 IMPLANT
PAD ARMBOARD 7.5X6 YLW CONV (MISCELLANEOUS) ×2 IMPLANT
POUCH SPECIMEN RETRIEVAL 10MM (ENDOMECHANICALS) ×2 IMPLANT
SCISSORS LAP 5X35 DISP (ENDOMECHANICALS) ×2 IMPLANT
SET IRRIG TUBING LAPAROSCOPIC (IRRIGATION / IRRIGATOR) ×2 IMPLANT
SLEEVE ENDOPATH XCEL 5M (ENDOMECHANICALS) ×2 IMPLANT
SPECIMEN JAR SMALL (MISCELLANEOUS) ×2 IMPLANT
SUT MNCRL AB 4-0 PS2 18 (SUTURE) ×2 IMPLANT
TOWEL OR 17X24 6PK STRL BLUE (TOWEL DISPOSABLE) ×2 IMPLANT
TOWEL OR 17X26 10 PK STRL BLUE (TOWEL DISPOSABLE) ×2 IMPLANT
TRAY LAPAROSCOPIC (CUSTOM PROCEDURE TRAY) ×2 IMPLANT
TROCAR XCEL BLUNT TIP 100MML (ENDOMECHANICALS) ×2 IMPLANT
TROCAR XCEL NON-BLD 11X100MML (ENDOMECHANICALS) ×2 IMPLANT
TROCAR XCEL NON-BLD 5MMX100MML (ENDOMECHANICALS) ×2 IMPLANT

## 2013-04-07 NOTE — Preoperative (Signed)
Beta Blockers   Reason not to administer Beta Blockers:Not Applicable, last dose 04/07/13 at 06:15

## 2013-04-07 NOTE — Anesthesia Preprocedure Evaluation (Addendum)
Anesthesia Evaluation  Patient identified by MRN, date of birth, ID band Patient awake    Airway Mallampati: II TM Distance: >3 FB Neck ROM: Full    Dental  (+) Edentulous Upper, Edentulous Lower and Dental Advisory Given   Pulmonary COPDformer smoker,  breath sounds clear to auscultation        Cardiovascular hypertension, + CAD and + Peripheral Vascular Disease Rhythm:Regular Rate:Normal     Neuro/Psych    GI/Hepatic GERD-  ,  Endo/Other    Renal/GU      Musculoskeletal   Abdominal   Peds  Hematology   Anesthesia Other Findings   Reproductive/Obstetrics                          Anesthesia Physical Anesthesia Plan  ASA: III  Anesthesia Plan: General   Post-op Pain Management:    Induction:   Airway Management Planned: Oral ETT  Additional Equipment:   Intra-op Plan:   Post-operative Plan: Extubation in OR  Informed Consent: I have reviewed the patients History and Physical, chart, labs and discussed the procedure including the risks, benefits and alternatives for the proposed anesthesia with the patient or authorized representative who has indicated his/her understanding and acceptance.     Plan Discussed with: Anesthesiologist and CRNA  Anesthesia Plan Comments:         Anesthesia Quick Evaluation

## 2013-04-07 NOTE — H&P (Signed)
Marvin Leon  12/16/2012 1:50 PM   Office Visit  MRN:  [id number]   Description: 79 year old male  Provider: Robyne Askew, MD  Department: [facility]        Diagnoses    Gallbladder disease    -  Primary    575.9      Reason for Visit    New Evaluation    eval gb polyp        Current Vitals - Last Recorded    BP Pulse Temp(Src) Resp Ht Wt    102/58 68 98.1 F (36.7 C) (Temporal) 14 5\' 9"  (1.753 m) 169 lb 3.2 oz (76.749 kg)       BMI              24.98 kg/m2                 Vitals History Recorded       Progress Notes    Robyne Askew, MD at 12/19/2012  2:33 PM    Status: Signed            Patient ID: Marvin Leon, male   DOB: [birth year], 79 y.o.   MRN: [id number]    Chief Complaint   Patient presents with   .  New Evaluation       eval gb polyp        HPI Marvin Leon is a 79 y.o. male.  We're asked to see the patient in consultation by Dr. Creta Levin to evaluate him for a gallbladder polyp. The patient is a 79 year old white male who has been experiencing some right flank pain recently. He denies any nausea or vomiting. He states that the pain is fairly constant. He has a history of cardiac stents placed by Dr. Gery Pray in cardiology. His recent ultrasound showed changes of adenomyomatosis of the gallbladder but no stones or ductal dilatation. HPI    Past Medical History   Diagnosis  Date   .  Arthritis     .  Diabetes mellitus without complication     .  GERD (gastroesophageal reflux disease)     .  Hyperlipidemia     .  Hypertension           Past Surgical History   Procedure  Laterality  Date   .  Cardiac catheterization           3 stints placed   .  Colonoscopy             Family History   Problem  Relation  Age of Onset   .  Heart disease  Mother     .  Heart disease  Father          Social History History   Substance Use Topics   .  Smoking status:  Former Smoker       Quit date:  05/18/1977   .  Smokeless  tobacco:  Never Used   .  Alcohol Use:  No         Allergies   Allergen  Reactions   .  Darvocet (Propoxyphene-Acetaminophen)  Nausea And Vomiting   .  Penicillins  Other (See Comments)       Reaction to penicillin and darvocet unknown   .  Propoxyphene And Methadone  Other (See Comments)         Current Outpatient Prescriptions   Medication  Sig  Dispense  Refill   .  ALPRAZolam (XANAX) 1 MG tablet  Take 1 mg by mouth 2 (two) times daily.          Marland Kitchen  aspirin EC 81 MG tablet  Take 81 mg by mouth every morning.           .  Calcium Carbonate-Vitamin D (CALCIUM 600/VITAMIN D) 600-400 MG-UNIT per tablet  Take 1 tablet by mouth daily.           .  Cetirizine HCl 10 MG CAPS  Take by mouth.         .  clopidogrel (PLAVIX) 75 MG tablet  Take 75 mg by mouth daily.           Marland Kitchen  dexlansoprazole (DEXILANT) 60 MG capsule  Take 60 mg by mouth daily.           Marland Kitchen  ezetimibe-simvastatin (VYTORIN) 10-40 MG per tablet  Take 1 tablet by mouth daily.   30 tablet   6   .  Ferrous Sulfate (IRON) 28 MG TABS  Take by mouth.         .  isosorbide mononitrate (IMDUR) 30 MG 24 hr tablet  Take 15 mg by mouth at bedtime.           .  metFORMIN (GLUCOPHAGE) 500 MG tablet  Take 1,000 mg by mouth 2 (two) times daily.          .  metoCLOPramide (REGLAN) 10 MG tablet  Take 10 mg by mouth 4 (four) times daily.         .  metoprolol (TOPROL-XL) 50 MG 24 hr tablet  Take 50 mg by mouth every morning.           .  Multiple Vitamins-Minerals (MULTIVITAMINS THER. W/MINERALS) TABS  Take 1 tablet by mouth daily.           .  niacin (NIASPAN) 750 MG CR tablet  Take 1,500 mg by mouth at bedtime.           Marland Kitchen  OVER THE COUNTER MEDICATION           .  OVER THE COUNTER MEDICATION           .  PARoxetine (PAXIL) 20 MG tablet  Take 20 mg by mouth every evening.           .  pregabalin (LYRICA) 75 MG capsule  Take 75 mg by mouth 2 (two) times daily.         .  tamsulosin (FLOMAX) 0.4 MG CAPS           .  metoCLOPramide (REGLAN) 5  MG tablet  Take 1 tablet (5 mg total) by mouth 4 (four) times daily -  before meals and at bedtime.   120 tablet   2   .  oxyCODONE (OXY IR/ROXICODONE) 5 MG immediate release tablet  Take 5 mg by mouth every 6 (six) hours as needed. For pain              No current facility-administered medications for this visit.        Review of Systems Review of Systems  Constitutional: Negative.   HENT: Negative.   Eyes: Negative.   Respiratory: Negative.   Cardiovascular: Negative.   Gastrointestinal: Negative.   Endocrine: Negative.   Genitourinary: Negative.   Musculoskeletal: Negative.   Skin: Negative.   Allergic/Immunologic: Negative.   Neurological: Negative.   Hematological: Negative.   Psychiatric/Behavioral: Negative.  Blood pressure 102/58, pulse 68, temperature 98.1 F (36.7 C), temperature source Temporal, resp. rate 14, height 5\' 9"  (1.753 m), weight 169 lb 3.2 oz (76.749 kg).   Physical Exam Physical Exam  Constitutional: He is oriented to person, place, and time. He appears well-developed and well-nourished.  HENT:   Head: Normocephalic and atraumatic.  Eyes: Conjunctivae and EOM are normal. Pupils are equal, round, and reactive to light.  Neck: Normal range of motion. Neck supple.  Cardiovascular: Normal rate, regular rhythm and normal heart sounds.   Pulmonary/Chest: Effort normal and breath sounds normal.  Abdominal: Soft. Bowel sounds are normal.  Musculoskeletal: Normal range of motion.  Neurological: He is alert and oriented to person, place, and time.  Skin: Skin is warm and dry.  Psychiatric: He has a normal mood and affect. His behavior is normal.      Data Reviewed As above   Assessment    The patient is having symptoms that could be consistent with biliary colic from adenomyomatosis. I believe it is possible that removing his gallbladder may help his symptoms but there is probably an equal chance that it may not. I have discussed this in  detail with him and offered him a laparoscopic cholecystectomy with cholangiogram. I have discussed with him in detail the risks and benefits of the operation as well as some of the technical aspects and he understands and wishes to proceed. We will need cardiac clearance prior to scheduling surgery. He will need to be off his blood thinner for 5 days prior to surgery.      Plan    Plan for cardiac clearance and then laparoscopic cholecystectomy with intraoperative cholangiogram

## 2013-04-07 NOTE — Progress Notes (Signed)
Arrived to 6n02 from pacu at this time, assisted in moving self from stretcher to bed without difficulty, family at bedside, oriented to room and surroundings, denies nausea/pain at this time.

## 2013-04-07 NOTE — Op Note (Signed)
04/07/2013  10:41 AM  PATIENT:  Marvin Leon  79 y.o. male  PRE-OPERATIVE DIAGNOSIS:  biliary colic  POST-OPERATIVE DIAGNOSIS:  biliary colic  PROCEDURE:  Procedure(s): LAPAROSCOPIC CHOLECYSTECTOMY WITH INTRAOPERATIVE CHOLANGIOGRAM (N/A)  SURGEON:  Surgeon(s) and Role:    * Robyne Askew, MD - Primary    * Axel Filler, MD - Assisting  PHYSICIAN ASSISTANT:   ASSISTANTS: Dr. Derrell Lolling   ANESTHESIA:   general  EBL:     BLOOD ADMINISTERED:none  DRAINS: none   LOCAL MEDICATIONS USED:  MARCAINE     SPECIMEN:  Source of Specimen:  gallbladder  DISPOSITION OF SPECIMEN:  PATHOLOGY  COUNTS:  YES  TOURNIQUET:  * No tourniquets in log *  DICTATION: .Dragon Dictation  Procedure: After informed consent was obtained the patient was brought to the operating room and placed in the supine position on the operating room table. After adequate induction of general anesthesia the patient's abdomen was prepped with ChloraPrep allowed to dry and draped in usual sterile manner. The area below the umbilicus was infiltrated with quarter percent  Marcaine. A small incision was made with a 15 blade knife. The incision was carried down through the subcutaneous tissue bluntly with a hemostat and [employer] retractors. The linea alba was identified. The linea alba was incised with a 15 blade knife and each side was grasped with Coker clamps. The preperitoneal space was then probed with a hemostat until the peritoneum was opened and access was gained to the abdominal cavity. A 0 Vicryl pursestring stitch was placed in the fascia surrounding the opening. A Hassan cannula was then placed through the opening and anchored in place with the previously placed Vicryl purse string stitch. The abdomen was insufflated with carbon dioxide without difficulty. A laparoscope was inserted through the [facility] cannula in the right upper quadrant was inspected. Next the epigastric region was infiltrated with % Marcaine. A  small incision was made with a 15 blade knife. A 10 mm port was placed bluntly through this incision into the abdominal cavity under direct vision. Next 2 sites were chosen laterally on the right side of the abdomen for placement of 5 mm ports. Each of these areas was infiltrated with quarter percent Marcaine. Small stab incisions were made with a 15 blade knife. 5 mm ports were then placed bluntly through these incisions into the abdominal cavity under direct vision without difficulty. A blunt grasper was placed through the lateralmost 5 mm port and used to grasp the dome of the gallbladder and elevated anteriorly and superiorly. Another blunt grasper was placed through the other 5 mm port and used to retract the body and neck of the gallbladder. A dissector was placed through the epigastric port and using the electrocautery the peritoneal reflection at the gallbladder neck was opened. Blunt dissection was then carried out in this area until the gallbladder neck-cystic duct junction was readily identified and a good window was created. A single clip was placed on the gallbladder neck. A small  ductotomy was made just below the clip with laparoscopic scissors. A 14-gauge Angiocath was then placed through the anterior abdominal wall under direct vision. A Reddick cholangiogram catheter was then placed through the Angiocath and flushed. The catheter was then placed in the cystic duct and anchored in place with a clip. A cholangiogram was obtained that showed no filling defects good emptying into the duodenum an adequate length on the cystic duct. The anchoring clip and catheters were then removed from the patient.  3 clips were placed proximally on the cystic duct and the duct was divided between the 2 sets of clips. Posterior to this the cystic artery was identified and again dissected bluntly in a circumferential manner until a good window  was created. 2 clips were placed proximally and one distally on the artery  and the artery was divided between the 2 sets of clips. Next a laparoscopic hook cautery device was used to separate the gallbladder from the liver bed. Prior to completely detaching the gallbladder from the liver bed the liver bed was inspected and several small bleeding points were coagulated with the electrocautery until the area was completely hemostatic. The gallbladder was then detached the rest of it from the liver bed without difficulty. A laparoscopic bag was inserted through the epigastric port. The gallbladder was placed within the bag and the bag was sealed. A laparoscope was then moved to the epigastric port. The gallbladder grasper was placed through the [facility] cannula and used to grasp the opening of the bag. The bag with the gallbladder was then removed with the [facility] cannula through the infraumbilical port without difficulty. The fascial defect was then closed with the previously placed Vicryl pursestring stitch as well as with another figure-of-eight 0 Vicryl stitch. The liver bed was inspected again and found to be hemostatic. The abdomen was irrigated with copious amounts of saline until the effluent was clear. The ports were then removed under direct vision without difficulty and were found to be hemostatic. The gas was allowed to escape. The skin incisions were all closed with interrupted 4-0 Monocryl subcuticular stitches. Dermabond dressings were applied. The patient tolerated the procedure well. At the end of the case all needle sponge and instrument counts were correct. The patient was then awakened and taken to recovery in stable condition  PLAN OF CARE: Admit for overnight observation  PATIENT DISPOSITION:  PACU - hemodynamically stable.   Delay start of Pharmacological VTE agent (>24hrs) due to surgical blood loss or risk of bleeding: not applicable

## 2013-04-07 NOTE — Anesthesia Postprocedure Evaluation (Signed)
  Anesthesia Post-op Note  Patient: Marvin Leon  Procedure(s) Performed: Procedure(s): LAPAROSCOPIC CHOLECYSTECTOMY WITH INTRAOPERATIVE CHOLANGIOGRAM (N/A)  Patient Location: PACU  Anesthesia Type:General  Level of Consciousness: awake  Airway and Oxygen Therapy: Patient Spontanous Breathing  Post-op Pain: mild  Post-op Assessment: Post-op Vital signs reviewed  Post-op Vital Signs: Reviewed  Complications: No apparent anesthesia complications

## 2013-04-07 NOTE — Progress Notes (Signed)
Dr.Edwards notified of Blood Sugar 70-orders received to give half an amp of D50

## 2013-04-07 NOTE — Anesthesia Procedure Notes (Signed)
Procedure Name: Intubation Date/Time: 04/07/2013 9:43 AM Performed by: De Nurse Pre-anesthesia Checklist: Patient identified, Emergency Drugs available, Suction available, Patient being monitored and Timeout performed Patient Re-evaluated:Patient Re-evaluated prior to inductionOxygen Delivery Method: Circle system utilized Preoxygenation: Pre-oxygenation with 100% oxygen Intubation Type: IV induction Ventilation: Mask ventilation without difficulty and Oral airway inserted - appropriate to patient size Laryngoscope Size: Mac and 3 Grade View: Grade I Tube type: Oral Tube size: 7.5 mm Number of attempts: 1 Airway Equipment and Method: Stylet Placement Confirmation: ETT inserted through vocal cords under direct vision,  positive ETCO2 and breath sounds checked- equal and bilateral Secured at: 22 cm Tube secured with: Tape Dental Injury: Teeth and Oropharynx as per pre-operative assessment

## 2013-04-07 NOTE — Interval H&P Note (Signed)
History and Physical Interval Note:  04/07/2013 9:07 AM  Marvin Leon  has presented today for surgery, with the diagnosis of biliary colic  The various methods of treatment have been discussed with the patient and family. After consideration of risks, benefits and other options for treatment, the patient has consented to  Procedure(s): LAPAROSCOPIC CHOLECYSTECTOMY WITH INTRAOPERATIVE CHOLANGIOGRAM (N/A) as a surgical intervention .  The patient's history has been reviewed, patient examined, no change in status, stable for surgery.  I have reviewed the patient's chart and labs.  Questions were answered to the patient's satisfaction.     TOTH III,PAUL S

## 2013-04-07 NOTE — Transfer of Care (Signed)
Immediate Anesthesia Transfer of Care Note  Patient: Marvin Leon  Procedure(s) Performed: Procedure(s): LAPAROSCOPIC CHOLECYSTECTOMY WITH INTRAOPERATIVE CHOLANGIOGRAM (N/A)  Patient Location: PACU  Anesthesia Type:General  Level of Consciousness: lethargic and responds to stimulation  Airway & Oxygen Therapy: Patient Spontanous Breathing and Patient connected to nasal cannula oxygen  Post-op Assessment: Report given to PACU RN  Post vital signs: Reviewed and stable  Complications: No apparent anesthesia complications

## 2013-04-08 ENCOUNTER — Encounter (HOSPITAL_COMMUNITY): Payer: Self-pay | Admitting: *Deleted

## 2013-04-08 LAB — GLUCOSE, CAPILLARY: Glucose-Capillary: 115 mg/dL — ABNORMAL HIGH (ref 70–99)

## 2013-04-08 MED ORDER — OXYCODONE HCL 5 MG PO TABS: 5.0000 mg | ORAL_TABLET | Freq: Four times a day (QID) | ORAL | Status: DC | PRN

## 2013-04-08 NOTE — Progress Notes (Signed)
DC home with daughter, verbally understood DC instructions, no questions asked 

## 2013-04-08 NOTE — Discharge Summary (Signed)
Physician Discharge Summary  Patient ID: TARIG ZIMMERS MRN: [id number] DOB/AGE: 77/06/1932 77 y.o.  Admit date: 04/07/2013 Discharge date: 04/08/2013  Admission Diagnoses: biliary colic  Discharge Diagnoses: same Active Problems:   * No active hospital problems. *   Discharged Condition: good  Hospital Course: Pt did well after cholecystectomy.  Consults: None  Significant Diagnostic Studies: none  Treatments: IV hydration and monitoring  Discharge Exam: Blood pressure 115/70, pulse 67, temperature 99 F (37.2 C), temperature source Oral, resp. rate 18, SpO2 96.00%. General appearance: alert and cooperative GI: normal findings: soft, non-tender Incision/Wound: no significant drainage  Disposition: 01-Home or Self Care   Future Appointments Provider Department Dept Phone   09/27/2013 2:30 PM Ronal Fear, NP [facility] [phone]       Medication List         acidophilus Caps capsule  Take 1 capsule by mouth daily.     ALPRAZolam 1 MG tablet  Commonly known as:  XANAX  Take 1 mg by mouth 2 (two) times daily.     aspirin EC 81 MG tablet  Take 81 mg by mouth every morning.     CALCIUM 600/VITAMIN D 600-400 MG-UNIT per tablet  Generic drug:  Calcium Carbonate-Vitamin D  Take 1 tablet by mouth 2 (two) times daily.     Cetirizine HCl 10 MG Caps  Take 10 mg by mouth every other day as needed (for allergies).     cholecalciferol 400 UNITS Tabs tablet  Commonly known as:  VITAMIN D  Take 400 Units by mouth 2 (two) times daily.     clopidogrel 75 MG tablet  Commonly known as:  PLAVIX  Take 75 mg by mouth daily.     dexlansoprazole 60 MG capsule  Commonly known as:  DEXILANT  Take 60 mg by mouth daily before breakfast.     docusate sodium 100 MG capsule  Commonly known as:  COLACE  Take 100 mg by mouth daily.     ezetimibe-simvastatin 10-40 MG per tablet  Commonly known as:  VYTORIN  Take 1 tablet by mouth every other day.     Iron  28 MG Tabs  Take 28 mg by mouth every other day.     isosorbide mononitrate 30 MG 24 hr tablet  Commonly known as:  IMDUR  Take 15 mg by mouth at bedtime.     metFORMIN 1000 MG tablet  Commonly known as:  GLUCOPHAGE  Take 1,000 mg by mouth 2 (two) times daily with a meal.     metoprolol succinate 50 MG 24 hr tablet  Commonly known as:  TOPROL-XL  Take 25 mg by mouth every morning.     multivitamins ther. w/minerals Tabs tablet  Take 1 tablet by mouth daily.     niacin 750 MG CR tablet  Commonly known as:  NIASPAN  Take 750 mg by mouth 2 (two) times daily.     oxyCODONE 5 MG immediate release tablet  Commonly known as:  Oxy IR/ROXICODONE  Take 1 tablet (5 mg total) by mouth every 6 (six) hours as needed.     oxyCODONE-acetaminophen 5-325 MG per tablet  Commonly known as:  PERCOCET/ROXICET     PARoxetine 20 MG tablet  Commonly known as:  PAXIL  Take 20 mg by mouth every evening.     pregabalin 75 MG capsule  Commonly known as:  LYRICA  Take 75 mg by mouth 2 (two) times daily.     tamsulosin 0.4 MG Caps  capsule  Commonly known as:  FLOMAX  Take 0.4 mg by mouth daily after supper.     TRUEPLUS LANCETS 33G Misc           Follow-up Information   Follow up with TOTH III,PAUL S, MD. Schedule an appointment as soon as possible for a visit in 2 weeks.   Specialty:  General Surgery   Contact information:   [address] [place] [address] [phone]       Signed: Vanita Panda 04/08/2013, 11:15 AM

## 2013-04-11 ENCOUNTER — Encounter (HOSPITAL_COMMUNITY): Payer: Self-pay | Admitting: General Surgery

## 2013-05-12 ENCOUNTER — Encounter (INDEPENDENT_AMBULATORY_CARE_PROVIDER_SITE_OTHER): Payer: Self-pay | Admitting: General Surgery

## 2013-05-12 ENCOUNTER — Ambulatory Visit (INDEPENDENT_AMBULATORY_CARE_PROVIDER_SITE_OTHER): Payer: PRIVATE HEALTH INSURANCE | Admitting: General Surgery

## 2013-05-12 VITALS — BP 129/86 | HR 80 | Temp 98.0°F | Resp 14 | Ht 65.0 in | Wt 164.4 lb

## 2013-05-12 DIAGNOSIS — K829 Disease of gallbladder, unspecified: Secondary | ICD-10-CM

## 2013-05-12 NOTE — Patient Instructions (Signed)
May return to normal activities 

## 2013-05-30 NOTE — Progress Notes (Signed)
Subjective:     Patient ID: Marvin Leon, male   DOB: 09/15/1932, 80 y.o.   MRN: [id number]  HPI The patient is a 80 year old male who is one month status post laparoscopic cholecystectomy for chronic cholecystitis. He tolerated the surgery well. He has no complaints today. His appetite is good and his bowels are working normally.  Review of Systems     Objective:   Physical Exam On exam his abdomen is soft and nontender. His incisions are all healing nicely with no sign of infection    Assessment:     The patient is one month status post laparoscopic cholecystectomy     Plan:     At this point he may begin returning to his normal activities. I will plan to see him back on a when necessary basis

## 2013-08-28 ENCOUNTER — Other Ambulatory Visit (HOSPITAL_COMMUNITY): Payer: Self-pay | Admitting: Cardiovascular Disease

## 2013-08-28 NOTE — Telephone Encounter (Signed)
Rx was sent to pharmacy electronically. 

## 2013-08-31 ENCOUNTER — Ambulatory Visit (INDEPENDENT_AMBULATORY_CARE_PROVIDER_SITE_OTHER): Payer: PRIVATE HEALTH INSURANCE | Admitting: Cardiology

## 2013-08-31 ENCOUNTER — Encounter: Payer: Self-pay | Admitting: Cardiology

## 2013-08-31 VITALS — BP 110/60 | HR 64 | Ht 69.0 in | Wt 166.6 lb

## 2013-08-31 DIAGNOSIS — IMO0002 Reserved for concepts with insufficient information to code with codable children: Secondary | ICD-10-CM

## 2013-08-31 DIAGNOSIS — E1165 Type 2 diabetes mellitus with hyperglycemia: Secondary | ICD-10-CM

## 2013-08-31 DIAGNOSIS — E1159 Type 2 diabetes mellitus with other circulatory complications: Secondary | ICD-10-CM

## 2013-08-31 DIAGNOSIS — K829 Disease of gallbladder, unspecified: Secondary | ICD-10-CM

## 2013-08-31 DIAGNOSIS — I739 Peripheral vascular disease, unspecified: Secondary | ICD-10-CM

## 2013-08-31 DIAGNOSIS — E1151 Type 2 diabetes mellitus with diabetic peripheral angiopathy without gangrene: Secondary | ICD-10-CM

## 2013-08-31 DIAGNOSIS — I2510 Atherosclerotic heart disease of native coronary artery without angina pectoris: Secondary | ICD-10-CM

## 2013-08-31 DIAGNOSIS — E785 Hyperlipidemia, unspecified: Secondary | ICD-10-CM

## 2013-08-31 DIAGNOSIS — I10 Essential (primary) hypertension: Secondary | ICD-10-CM

## 2013-08-31 NOTE — Patient Instructions (Signed)
Your physician recommends that you schedule a follow-up appointment in: 6 months  

## 2013-08-31 NOTE — Assessment & Plan Note (Signed)
Peripheral neuropathy, gait disorder

## 2013-08-31 NOTE — Assessment & Plan Note (Signed)
S/P Lap chole Sept 2014

## 2013-08-31 NOTE — Progress Notes (Signed)
08/31/2013 Marvin Leon   [birth year]  [id number]  Primary Physicia STALLINGS,SHEILA, MD Primary Cardiologist: Dr Allyson SabalBerry  HPI:  Pleasant 80 y/o followed at [facility] with a history of coronary artery disease with three-vessel  intervention in 2001. In May 2001, he underwent  circumflex and LAD stenting and after abnormal Myoview he was then brought back in July 2001 for an RCA stent. Stents were patent in July 2003, and in 2010. He is here for a 6 month f/u. He has been doing well from a cardiac standpoint. He has had no angina.  He had laparoscopic cholecystectomy in Sept 2014 and tolerated this well.     Current Outpatient Prescriptions  Medication Sig Dispense Refill  . ALPRAZolam (XANAX) 1 MG tablet Take 1 mg by mouth 2 (two) times daily.        Marland Kitchen. aspirin EC 81 MG tablet Take 81 mg by mouth every morning.        . Calcium Carbonate-Vitamin D (CALCIUM 600/VITAMIN D) 600-400 MG-UNIT per tablet Take 1 tablet by mouth 2 (two) times daily.       . Cetirizine HCl 10 MG CAPS Take 10 mg by mouth every other day as needed (for allergies).       . cholecalciferol (VITAMIN D) 400 UNITS TABS tablet Take 400 Units by mouth 2 (two) times daily.       . clopidogrel (PLAVIX) 75 MG tablet TAKE 1 TABLET DAILY  30 tablet  4  . dexlansoprazole (DEXILANT) 60 MG capsule Take 60 mg by mouth daily before breakfast.       . docusate sodium (COLACE) 100 MG capsule Take 100 mg by mouth daily.      Marland Kitchen. ezetimibe-simvastatin (VYTORIN) 10-40 MG per tablet Take 1 tablet by mouth every other day.       . Ferrous Sulfate (IRON) 28 MG TABS Take 28 mg by mouth every other day.       . isosorbide mononitrate (IMDUR) 30 MG 24 hr tablet Take 15 mg by mouth at bedtime.        . metFORMIN (GLUCOPHAGE) 1000 MG tablet Take 1,000 mg by mouth 2 (two) times daily with a meal.      . metoCLOPramide (REGLAN) 5 MG tablet Take 5 mg by mouth daily.      . metoprolol (TOPROL-XL) 50 MG 24 hr tablet Take 25 mg by mouth every  morning.       . Multiple Vitamins-Minerals (MULTIVITAMINS THER. W/MINERALS) TABS Take 1 tablet by mouth daily.        . niacin (NIASPAN) 750 MG CR tablet Take 750 mg by mouth 2 (two) times daily.       Marland Kitchen. PARoxetine (PAXIL) 20 MG tablet Take 20 mg by mouth every evening.        . pregabalin (LYRICA) 75 MG capsule Take 75 mg by mouth 2 (two) times daily.      . Probiotic Product (ULTRAFLORA IMMUNE HEALTH PO) Take 1 tablet by mouth daily before breakfast.      . tamsulosin (FLOMAX) 0.4 MG CAPS Take 0.4 mg by mouth daily after supper.        No current facility-administered medications for this visit.    Allergies  Allergen Reactions  . Darvocet [Propoxyphene N-Acetaminophen] Nausea And Vomiting  . Penicillins Other (See Comments)    Reaction to penicillin and darvocet unknown  . Propoxyphene And Methadone Other (See Comments)    History   Social History  . Marital  Status: Married    Spouse Name: N/A    Number of Children: 4  . Years of Education: 8th   Occupational History  .     Social History Main Topics  . Smoking status: Former Smoker -- 3.00 packs/day for 20 years    Quit date: 05/18/1977  . Smokeless tobacco: Never Used  . Alcohol Use: No  . Drug Use: No  . Sexual Activity: No   Other Topics Concern  . Not on file   Social History Narrative   His father is deceased from a heart attack at age 78. His mother is deceased from congestive heart failure at age 78.  His mother also had rectal cancer, heart disease and diabetes. There is heart disease, diabetes and lung disease in his brothers. There is lung disease and psychological problems in a sister.     Review of Systems: General: negative for chills, fever, night sweats or weight changes.  Cardiovascular: negative for chest pain, dyspnea on exertion, edema, orthopnea, palpitations, paroxysmal nocturnal dyspnea or shortness of breath Dermatological: negative for rash Respiratory: negative for cough or  wheezing Urologic: negative for hematuria Abdominal: negative for nausea, vomiting, diarrhea, bright red blood per rectum, melena, or hematemesis Neurologic: negative for visual changes, syncope, or dizziness All other systems reviewed and are otherwise negative except as noted above.    Blood pressure 110/60, pulse 64, height 5\' 9"  (1.753 m), weight 166 lb 9.6 oz (75.569 kg).  General appearance: alert, cooperative, no distress and mild resting tremor Lungs: clear to auscultation bilaterally Heart: regular rate and rhythm  EKG NSR  ASSESSMENT AND PLAN:   CAD (coronary artery disease) Last cath 2010- patent stents. No angina since LOV  Gallbladder disease S/P Lap chole Sept 2014  Hyperlipemia On Vytorin, labs to be done at Primary Care provider's office  PVD (peripheral vascular disease) ABI's WNL Oct 2014  HTN (hypertension) Controlled  DM (diabetes mellitus) type II uncontrolled, periph vascular disorder Peripheral neuropathy, gait disorder   PLAN  Dr Allyson SabalBerry in 6 months, he is to have labs, lipids, LFTsat his Primary Care office.   Eda PaschalLuke K Rum[facility] Hospital[facility]ilroyPA-C 08/31/2013 10:20 AM

## 2013-08-31 NOTE — Assessment & Plan Note (Signed)
Controlled.  

## 2013-08-31 NOTE — Assessment & Plan Note (Signed)
On Vytorin, labs to be done at Primary Care provider's office

## 2013-08-31 NOTE — Assessment & Plan Note (Signed)
ABI's WNL Oct 2014

## 2013-08-31 NOTE — Assessment & Plan Note (Signed)
Last cath 2010- patent stents. No angina since LOV

## 2013-09-27 ENCOUNTER — Ambulatory Visit: Payer: Medicare Other | Admitting: Nurse Practitioner

## 2013-10-10 ENCOUNTER — Other Ambulatory Visit (HOSPITAL_COMMUNITY): Payer: Self-pay | Admitting: Cardiovascular Disease

## 2013-10-10 NOTE — Telephone Encounter (Signed)
Rx was sent to pharmacy electronically. 

## 2013-11-15 ENCOUNTER — Other Ambulatory Visit: Payer: Self-pay | Admitting: Family Medicine

## 2013-11-15 DIAGNOSIS — M545 Low back pain, unspecified: Secondary | ICD-10-CM

## 2013-11-23 ENCOUNTER — Ambulatory Visit
Admission: RE | Admit: 2013-11-23 | Discharge: 2013-11-23 | Disposition: A | Discharge status: Home / Self Care | Payer: PRIVATE HEALTH INSURANCE | Source: Ambulatory Visit | Attending: Family Medicine | Admitting: Family Medicine

## 2013-11-23 DIAGNOSIS — M545 Low back pain, unspecified: Secondary | ICD-10-CM

## 2013-11-27 ENCOUNTER — Telehealth: Payer: Self-pay | Admitting: Cardiovascular Disease

## 2013-11-29 NOTE — Telephone Encounter (Signed)
Closed encounter °

## 2013-12-05 ENCOUNTER — Encounter: Payer: Self-pay | Admitting: Nurse Practitioner

## 2013-12-05 ENCOUNTER — Ambulatory Visit (INDEPENDENT_AMBULATORY_CARE_PROVIDER_SITE_OTHER): Payer: Medicare Other | Admitting: Nurse Practitioner

## 2013-12-05 VITALS — BP 123/73 | HR 73 | Ht 68.5 in | Wt 167.8 lb

## 2013-12-05 DIAGNOSIS — IMO0002 Reserved for concepts with insufficient information to code with codable children: Secondary | ICD-10-CM

## 2013-12-05 DIAGNOSIS — M25559 Pain in unspecified hip: Secondary | ICD-10-CM

## 2013-12-05 DIAGNOSIS — M25551 Pain in right hip: Secondary | ICD-10-CM

## 2013-12-05 DIAGNOSIS — G629 Polyneuropathy, unspecified: Secondary | ICD-10-CM

## 2013-12-05 DIAGNOSIS — E1159 Type 2 diabetes mellitus with other circulatory complications: Secondary | ICD-10-CM

## 2013-12-05 DIAGNOSIS — E1165 Type 2 diabetes mellitus with hyperglycemia: Secondary | ICD-10-CM

## 2013-12-05 DIAGNOSIS — G609 Hereditary and idiopathic neuropathy, unspecified: Secondary | ICD-10-CM

## 2013-12-05 DIAGNOSIS — E1151 Type 2 diabetes mellitus with diabetic peripheral angiopathy without gangrene: Secondary | ICD-10-CM

## 2013-12-05 DIAGNOSIS — I798 Other disorders of arteries, arterioles and capillaries in diseases classified elsewhere: Secondary | ICD-10-CM

## 2013-12-05 MED ORDER — DICLOFENAC SODIUM 1 % TD GEL: 2.0000 g | Freq: Four times a day (QID) | TRANSDERMAL | Status: DC

## 2013-12-05 MED ORDER — PREGABALIN 75 MG PO CAPS: 75.0000 mg | ORAL_CAPSULE | Freq: Two times a day (BID) | ORAL | Status: DC

## 2013-12-05 NOTE — Progress Notes (Signed)
PATIENT: Marvin Leon DOB: 1932/11/16  REASON FOR VISIT: routine follow up for peripheral neuropathy HISTORY FROM: patient  HISTORY OF PRESENT ILLNESS: 80 year old male with diabetic peripheral neuropathy with chronic dizziness likely multifactorial due to combination of diabetic neuropathy as well as orthostatic symptoms.   12/26/2012 (PS): he returns followup today after last visit on 10/24/10. He continues intermittent dizziness which he describes as a feeling of imbalance particularly when is was getting up or walking or making a sudden movement. He denies any vertigo, blurred vision. He had he has had a few falls but no major injury. He uses a cane for short distances on a walker for long distances. He states his diabetes control has been better after medication changes by his primary physician and following a new diet. His fasting sugars range in the 110-120 range. He continues to have burning and paresthesias in his feet and takes Lyrica 75 twice a day which is tolerating quite well. The burning in the feet seems to be more with increased walking.   UPDATE 03/30/13 (LL): Patient returns for follow up for dizziness. He plans to have elective laparoscopic cholecystectomy on November 21 and would like medical clearance. He has been doing well, following a carb-modified diet, blood sugars are under good control. Lyrica is very helpful for the burning in his feet, takes it BID. He has been using the foot-pumping exercises that Dr. Pearlean BrownieSethi recommended before he gets up and has not had any falls.   UPDATE 12/05/13 (LL): Since last visit, patient had lap chole without com[plications.  He states he is no longer dizzy.  Neuropathic pain in feet is well controlled with Lyrica.  Denies falls. Having pain in right hip and leg, MRI lumbar spine showed degenerative disease, no compression or stenosis.  He was advised to move more.    ROS:  14 system review of systems is positive for hearing loss, runny nose  and cough.  ALLERGIES: Allergies  Allergen Reactions  . Darvocet [Propoxyphene N-Acetaminophen] Nausea And Vomiting  . Penicillins Other (See Comments)    Reaction to penicillin and darvocet unknown  . Propoxyphene And Methadone Other (See Comments)    HOME MEDICATIONS: Outpatient Prescriptions Prior to Visit  Medication Sig Dispense Refill  . ALPRAZolam (XANAX) 1 MG tablet Take 1 mg by mouth 2 (two) times daily.        Marland Kitchen. aspirin EC 81 MG tablet Take 81 mg by mouth every morning.        . Calcium Carbonate-Vitamin D (CALCIUM 600/VITAMIN D) 600-400 MG-UNIT per tablet Take 1 tablet by mouth 2 (two) times daily.       . Cetirizine HCl 10 MG CAPS Take 10 mg by mouth every other day as needed (for allergies).       . cholecalciferol (VITAMIN D) 400 UNITS TABS tablet Take 400 Units by mouth 2 (two) times daily.       . clopidogrel (PLAVIX) 75 MG tablet TAKE 1 TABLET DAILY  30 tablet  4  . dexlansoprazole (DEXILANT) 60 MG capsule Take 60 mg by mouth daily before breakfast.       . docusate sodium (COLACE) 100 MG capsule Take 100 mg by mouth daily.      Marland Kitchen. ezetimibe-simvastatin (VYTORIN) 10-40 MG per tablet Take 1 tablet by mouth every other day.       . Ferrous Sulfate (IRON) 28 MG TABS Take 28 mg by mouth every other day.       . isosorbide  mononitrate (IMDUR) 30 MG 24 hr tablet Take 15 mg by mouth at bedtime.        . metFORMIN (GLUCOPHAGE) 1000 MG tablet Take 1,000 mg by mouth 2 (two) times daily with a meal.      . metoCLOPramide (REGLAN) 5 MG tablet Take 5 mg by mouth daily.      . metoprolol (TOPROL-XL) 50 MG 24 hr tablet Take 25 mg by mouth every morning.       . Multiple Vitamins-Minerals (MULTIVITAMINS THER. W/MINERALS) TABS Take 1 tablet by mouth daily.        . niacin (NIASPAN) 750 MG CR tablet TAKE 2 TABLETS AT BEDTIME  60 tablet  10  . PARoxetine (PAXIL) 20 MG tablet Take 20 mg by mouth every evening.        . Probiotic Product (ULTRAFLORA IMMUNE HEALTH PO) Take 1 tablet by mouth  daily before breakfast.      . tamsulosin (FLOMAX) 0.4 MG CAPS Take 0.4 mg by mouth daily after supper.       . pregabalin (LYRICA) 75 MG capsule Take 75 mg by mouth 2 (two) times daily.       No facility-administered medications prior to visit.    PHYSICAL EXAM Filed Vitals:   12/05/13 1434  BP: 123/73  Pulse: 73  Height: 5' 8.5" (1.74 m)  Weight: 167 lb 12.8 oz (76.114 kg)   Body mass index is 25.14 kg/(m^2).  Physical Exam  General: well developed, well nourished, seated, in no evident distress  Head: head normocephalic and atraumatic. Orohparynx benign  Neck: supple with no carotid or supraclavicular bruits  Cardiovascular: regular rate and rhythm, no murmurs  Musculoskeletal: no deformity  Skin: no rash/petichiae  Vascular: Normal pulses all extremities   Neurologic Exam  Mental Status: Awake and fully alert. Oriented to place and time. Recent and remote memory intact. Attention span, concentration and fund of knowledge appropriate. Mood and affect appropriate.  Cranial Nerves: Fundoscopic exam not done. Pupils equal, briskly reactive to light. Extraocular movements full without nystagmus. Visual fields full to confrontation. Hearing intact. Facial sensation intact. Face, tongue, palate moves normally and symmetrically.  Motor: Normal bulk and tone. Normal strength in all tested extremity muscles.  Sensory: Diminished touch, pinprick and vibration sensation from ankle down.  Coordination: Rapid alternating movements normal in all extremities. Finger-to-nose and heel-to-shin performed accurately bilaterally.  Gait and Station: Arises from chair without difficulty. Stance is normal. Gait demonstrates normal stride length and balance. Unsteady while standing on a narrow base. Unable to heel, toe and tandem walk without difficulty. Ambulates with cane.  Reflexes: 1+ and symmetric except ankle jerks are depressed. Toes downgoing.   ASSESSMENT: 80 y.o. male with diabetic peripheral  neuropathy with chronic dizziness likely multifactorial due to combination of diabetic neuropathy as well as orthostatic symptoms. Gait difficulty also due to pain of right hip and leg, likely osteoarthritis.  PLAN:  He was advised to do orthostatic tolerance exercises before getting up and continue Lyrica 75 mg twice daily for his neuropathic pain. Try Voltaren gel for osteoarthritis pain, 2 samples given.  May use topically up to 4 times daily.  Return for followup in 1 year with Dr. Pearlean Brownie, sooner as needed.  Meds ordered this encounter  Medications  . diclofenac sodium (VOLTAREN) 1 % GEL    Sig: Apply 2 g topically 4 (four) times daily.    Dispense:  20 g    Refill:  0    SAMPLES 2 boxes  Order Specific Question:  Supervising Provider    Answer:  York Spaniel [[id number]]  . pregabalin (LYRICA) 75 MG capsule    Sig: Take 1 capsule (75 mg total) by mouth 2 (two) times daily.    Dispense:  180 capsule    Refill:  3    Order Specific Question:  Supervising Provider    Answer:  York Spaniel [[id number]]   Return in about 6 months (around 06/07/2014) for peripheral neuropathy and dizziness.  Tawny Asal LAM, MSN, FNP-BC, A/GNP-C 12/05/2013, 3:00 PM [facility] [address] [place], [place] [address] [phone]  Note: This document was prepared with digital dictation and possible smart phrase technology. Any transcriptional errors that result from this process are unintentional.

## 2013-12-05 NOTE — Patient Instructions (Addendum)
Do orthostatic tolerance exercises before getting up and continue Lyrica 75 mg twice daily for neuropathic pain.  Return for followup in 6 months with Dr. Pearlean BrownieSethi, sooner as needed.   Peripheral Neuropathy Peripheral neuropathy is a type of nerve damage. It affects nerves that carry signals between the spinal cord and other parts of the body. These are called peripheral nerves. With peripheral neuropathy, one nerve or a group of nerves may be damaged.  CAUSES  Many things can damage peripheral nerves. For some people with peripheral neuropathy, the cause is unknown. Some causes include:  Diabetes. This is the most common cause of peripheral neuropathy.  Injury to a nerve.  Pressure or stress on a nerve that lasts a long time.  Too little vitamin B. Alcoholism can lead to this.  Infections.  Autoimmune diseases, such as multiple sclerosis and systemic lupus erythematosus.  Inherited nerve diseases.  Some medicines, such as cancer drugs.  Toxic substances, such as lead and mercury.  Too little blood flowing to the legs.  Kidney disease.  Thyroid disease. SIGNS AND SYMPTOMS  Different people have different symptoms. The symptoms you have will depend on which of your nerves is damaged. Common symptoms include:  Loss of feeling (numbness) in the feet and hands.  Tingling in the feet and hands.  Pain that burns.  Very sensitive skin.  Weakness.  Not being able to move a part of the body (paralysis).  Muscle twitching.  Clumsiness or poor coordination.  Loss of balance.  Not being able to control your bladder.  Feeling dizzy.  Sexual problems. DIAGNOSIS  Peripheral neuropathy is a symptom, not a disease. Finding the cause of peripheral neuropathy can be hard. To figure that out, your health care provider will take a medical history and do a physical exam. A neurological exam will also be done. This involves checking things affected by your brain, spinal cord, and  nerves (nervous system). For example, your health care provider will check your reflexes, how you move, and what you can feel.  Other types of tests may also be ordered, such as:  Blood tests.  A test of the fluid in your spinal cord.  Imaging tests, such as CT scans or an MRI.  Electromyography (EMG). This test checks the nerves that control muscles.  Nerve conduction velocity tests. These tests check how fast messages pass through your nerves.  Nerve biopsy. A small piece of nerve is removed. It is then checked under a microscope. TREATMENT   Medicine is often used to treat peripheral neuropathy. Medicines may include:  Pain-relieving medicines. Prescription or over-the-counter medicine may be suggested.  Antiseizure medicine. This may be used for pain.  Antidepressants. These also may help ease pain from neuropathy.  Lidocaine. This is a numbing medicine. You might wear a patch or be given a shot.  Mexiletine. This medicine is typically used to help control irregular heart rhythms.  Surgery. Surgery may be needed to relieve pressure on a nerve or to destroy a nerve that is causing pain.  Physical therapy to help movement.  Assistive devices to help movement. HOME CARE INSTRUCTIONS   Only take over-the-counter or prescription medicines as directed by your health care provider. Follow the instructions carefully for any given medicines. Do not take any other medicines without first getting approval from your health care provider.  If you have diabetes, work closely with your health care provider to keep your blood sugar under control.  If you have numbness in your feet:  Check every day for signs of injury or infection. Watch for redness, warmth, and swelling.  Wear padded socks and comfortable shoes. These help protect your feet.  Do not do things that put pressure on your damaged nerve.  Do not smoke. Smoking keeps blood from getting to damaged nerves.  Avoid or  limit alcohol. Too much alcohol can cause a lack of B vitamins. These vitamins are needed for healthy nerves.  Develop a good support system. Coping with peripheral neuropathy can be stressful. Talk to a mental health specialist or join a support group if you are struggling.  Follow up with your health care provider as directed. SEEK MEDICAL CARE IF:   You have new signs or symptoms of peripheral neuropathy.  You are struggling emotionally from dealing with peripheral neuropathy.  You have a fever. SEEK IMMEDIATE MEDICAL CARE IF:   You have an injury or infection that is not healing.  You feel very dizzy or begin vomiting.  You have chest pain.  You have trouble breathing. Document Released: 04/24/2002 Document Revised: 01/14/2011 Document Reviewed: 01/09/2013 [facility] Patient Information 2015 [place], [place]. This information is not intended to replace advice given to you by your health care provider. Make sure you discuss any questions you have with your health care provider.

## 2013-12-05 NOTE — Progress Notes (Signed)
I agree with the above plan 

## 2014-01-15 ENCOUNTER — Other Ambulatory Visit: Payer: Self-pay | Admitting: Cardiovascular Disease

## 2014-01-15 NOTE — Telephone Encounter (Signed)
Rx was sent to pharmacy electronically. 

## 2014-01-29 ENCOUNTER — Ambulatory Visit (INDEPENDENT_AMBULATORY_CARE_PROVIDER_SITE_OTHER): Payer: PRIVATE HEALTH INSURANCE | Admitting: Cardiovascular Disease

## 2014-01-29 ENCOUNTER — Other Ambulatory Visit (HOSPITAL_COMMUNITY): Payer: Self-pay | Admitting: Cardiovascular Disease

## 2014-01-29 ENCOUNTER — Encounter: Payer: Self-pay | Admitting: Cardiovascular Disease

## 2014-01-29 ENCOUNTER — Ambulatory Visit: Payer: PRIVATE HEALTH INSURANCE | Admitting: Cardiovascular Disease

## 2014-01-29 VITALS — BP 100/60 | HR 65 | Ht 69.0 in | Wt 166.0 lb

## 2014-01-29 DIAGNOSIS — I2510 Atherosclerotic heart disease of native coronary artery without angina pectoris: Secondary | ICD-10-CM

## 2014-01-29 DIAGNOSIS — Z79899 Other long term (current) drug therapy: Secondary | ICD-10-CM

## 2014-01-29 DIAGNOSIS — I10 Essential (primary) hypertension: Secondary | ICD-10-CM

## 2014-01-29 DIAGNOSIS — I739 Peripheral vascular disease, unspecified: Secondary | ICD-10-CM

## 2014-01-29 DIAGNOSIS — D689 Coagulation defect, unspecified: Secondary | ICD-10-CM

## 2014-01-29 NOTE — Assessment & Plan Note (Signed)
Controlled on current medications 

## 2014-01-29 NOTE — Assessment & Plan Note (Signed)
History of CAD status post PCI stenting of all 3 vessels in the past. I performed cardiac catheterization on him 12/18/08 revealed noncritical CAD with patent stents and normal LV function. He denies chest pain or shortness of breath.

## 2014-01-29 NOTE — Telephone Encounter (Signed)
Rx refill sent to patient pharmacy   

## 2014-01-29 NOTE — Patient Instructions (Addendum)
Dr. Allyson Sabal has ordered a peripheral angiogram to be done at [facility].  This procedure is going to look at the bloodflow in your lower extremities.  If Dr. Allyson Sabal is able to open up the arteries, you will have to spend one night in the hospital.  If he is not able to open the arteries, you will be able to go home that same day.    After the procedure, you will not be allowed to drive for 3 days or push, pull, or lift anything greater than 10 lbs for one week.    You will be required to have the following tests prior to the procedure:  1. Blood work-the blood work can be done no more than 7 days prior to the procedure.  It can be done at any [facility] lab.  There is one downstairs on the first floor of this building and one in the [facility] ([address])  2. Chest Xray-the chest xray order has already been placed at the [facility] Building.     *REPS n/a   lower extremity arterial doppler (to be done prior to the angiogram)- During this test, ultrasound is used to evaluate arterial blood flow in the legs. Allow approximately one hour for this exam.

## 2014-01-29 NOTE — Progress Notes (Signed)
01/29/2014 Marvin Leon   09-Dec-1932  [id number]  Primary Physician Marvin Hoit, MD Primary Cardiologist: Marvin Gess MD Marvin Leon   HPI:  The patient is a 80 year old, mildly overweight, married Caucasian male, father of 4, grandfather to 6 grandchildren who I last saw 12 months ago. He has a history of CAD status post previous PCI and stenting of all 3 of his heart vessels. His other problems include noninsulin-requiring diabetes, hypertension and hyperlipidemia. I last catheterized him December 18, 2008, during a rule out-MI admission revealing noncritical CAD with patent stents and normal LV function. He also had a documented 50% to 60% ostial right common iliac artery stenosis. We have been following his carotid Dopplers. He really denies claudication but has chronic pain in his lower extremities with some diabetic peripheral neuropathic symptoms as well. He says he "staggers" and is chronically weak. Lower extremity Dopplers performed a year ago revealed normal ABIs and carotid Dopplers showed no significant evidence of ICA stenosis.  Since he was seen 12 months ago he has been completely asymptomatic except for the development of right hip and lower extremity pain with ambulation. He did stop his Vytorin because of leg pain but the pain persisted and he suddenly has restarted his Vytorin. We will recheck a lipid liver profile. Recent lower extremity arterial Doppler studies performed in our office 03/16/13 revealed increase in his velocities from 304 400 cm/s.    Current Outpatient Prescriptions  Medication Sig Dispense Refill  . ALPRAZolam (XANAX) 1 MG tablet Take 1 mg by mouth 2 (two) times daily.        Marland Kitchen aspirin EC 81 MG tablet Take 81 mg by mouth every morning.        . Cetirizine HCl 10 MG CAPS Take 10 mg by mouth every other day as needed (for allergies).       . cholecalciferol (VITAMIN D) 400 UNITS TABS tablet Take 400 Units by mouth 2 (two) times daily.        . clopidogrel (PLAVIX) 75 MG tablet TAKE 1 TABLET DAILY  30 tablet  4  . dexlansoprazole (DEXILANT) 60 MG capsule Take 60 mg by mouth daily before breakfast.       . diclofenac sodium (VOLTAREN) 1 % GEL Apply 2 g topically 4 (four) times daily.  20 g  0  . docusate sodium (COLACE) 100 MG capsule Take 100 mg by mouth daily.      . Ferrous Sulfate (IRON) 28 MG TABS Take 28 mg by mouth every other day.       Marland Kitchen HYDROcodone-acetaminophen (NORCO/VICODIN) 5-325 MG per tablet Take 1 tablet by mouth every 6 (six) hours as needed for moderate pain.      . isosorbide mononitrate (IMDUR) 30 MG 24 hr tablet Take 15 mg by mouth at bedtime.        . metFORMIN (GLUCOPHAGE) 1000 MG tablet Take 1,000 mg by mouth 2 (two) times daily with a meal.      . metoCLOPramide (REGLAN) 5 MG tablet Take 5 mg by mouth daily.      . metoprolol (TOPROL-XL) 50 MG 24 hr tablet Take 25 mg by mouth every morning.       . Multiple Vitamins-Minerals (MULTIVITAMINS THER. W/MINERALS) TABS Take 1 tablet by mouth daily.        . niacin (NIASPAN) 750 MG CR tablet TAKE 2 TABLETS AT BEDTIME  60 tablet  10  . PARoxetine (PAXIL) 20 MG tablet Take 20  mg by mouth every evening.        . pregabalin (LYRICA) 75 MG capsule Take 1 capsule (75 mg total) by mouth 2 (two) times daily.  180 capsule  3  . Probiotic Product (ULTRAFLORA IMMUNE HEALTH PO) Take 1 tablet by mouth daily before breakfast.      . tamsulosin (FLOMAX) 0.4 MG CAPS Take 0.4 mg by mouth daily after supper.       Marland Kitchen VYTORIN 10-40 MG per tablet TAKE 1 TABLET DAILY  30 tablet  8   No current facility-administered medications for this visit.    Allergies  Allergen Reactions  . Darvocet [Propoxyphene N-Acetaminophen] Nausea And Vomiting  . Penicillins Other (See Comments)    Reaction to penicillin and darvocet unknown  . Propoxyphene And Methadone Other (See Comments)    History   Social History  . Marital Status: Married    Spouse Name: Venita Sheffield    Number of Children: 4    . Years of Education: 8th   Occupational History  .     Social History Main Topics  . Smoking status: Former Smoker -- 3.00 packs/day for 20 years    Quit date: 05/18/1977  . Smokeless tobacco: Never Used  . Alcohol Use: No  . Drug Use: No  . Sexual Activity: No   Other Topics Concern  . Not on file   Social History Narrative   His father is deceased from a heart attack at age 28. His mother is deceased from congestive heart failure at age 26.  His mother also had rectal cancer, heart disease and diabetes. There is heart disease, diabetes and lung disease in his brothers. There is lung disease and psychological problems in a sister.     Review of Systems: General: negative for chills, fever, night sweats or weight changes.  Cardiovascular: negative for chest pain, dyspnea on exertion, edema, orthopnea, palpitations, paroxysmal nocturnal dyspnea or shortness of breath Dermatological: negative for rash Respiratory: negative for cough or wheezing Urologic: negative for hematuria Abdominal: negative for nausea, vomiting, diarrhea, bright red blood per rectum, melena, or hematemesis Neurologic: negative for visual changes, syncope, or dizziness All other systems reviewed and are otherwise negative except as noted above.    Blood pressure 100/60, pulse 65, height  (1.753 m), weight 166 lb (75.297 kg).  General appearance: alert and no distress Neck: no adenopathy, no JVD, supple, symmetrical, trachea midline, thyroid not enlarged, symmetric, no tenderness/mass/nodules and soft bilateral carotid bruits Lungs: clear to auscultation bilaterally Heart: regular rate and rhythm, S1, S2 normal, no murmur, click, rub or gallop Extremities: extremities normal, atraumatic, no cyanosis or edema  EKG normal sinus rhythm at 60 without ST or T wave changes  ASSESSMENT AND PLAN:   HTN (hypertension) Controlled on current medications  PVD (peripheral vascular disease) History of to  60% ostial right common iliac artery stenosis documented at the time of cardiac catheterization 12/18/08. At that time he was not complaining of claudication however recently he developed right hip and lower extremity discomfort on ambulation with Dopplers to reveal normal ABI but increase in his common iliac callosities from 330 up to 401 cm/s. Based on this, I decided to proceed with angiography and potential intervention for lifestyle limiting claudication.  Hyperlipemia On statin therapy followed by his PCP  CAD (coronary artery disease) History of CAD status post PCI stenting of all 3 vessels in the past. I performed cardiac catheterization on him 12/18/08 revealed noncritical CAD with patent stents and normal  LV function. He denies chest pain or shortness of breath.      Marvin Gess MD FACP,FACC,FAHA, [facility] 01/29/2014 4:43 PM

## 2014-01-29 NOTE — Assessment & Plan Note (Signed)
History of to 60% ostial right common iliac artery stenosis documented at the time of cardiac catheterization 12/18/08. At that time he was not complaining of claudication however recently he developed right hip and lower extremity discomfort on ambulation with Dopplers to reveal normal ABI but increase in his common iliac callosities from 330 up to 401 cm/s. Based on this, I decided to proceed with angiography and potential intervention for lifestyle limiting claudication.

## 2014-01-29 NOTE — Assessment & Plan Note (Signed)
On statin therapy followed by his PCP 

## 2014-01-30 ENCOUNTER — Encounter: Payer: Self-pay | Admitting: Cardiovascular Disease

## 2014-02-09 ENCOUNTER — Ambulatory Visit (HOSPITAL_COMMUNITY)
Admission: RE | Admit: 2014-02-09 | Discharge: 2014-02-09 | Disposition: A | Discharge status: Home / Self Care | Payer: PRIVATE HEALTH INSURANCE | Source: Ambulatory Visit | Attending: Cardiovascular Disease | Admitting: Cardiovascular Disease

## 2014-02-09 DIAGNOSIS — I739 Peripheral vascular disease, unspecified: Secondary | ICD-10-CM

## 2014-02-09 DIAGNOSIS — I70219 Atherosclerosis of native arteries of extremities with intermittent claudication, unspecified extremity: Secondary | ICD-10-CM | POA: Insufficient documentation

## 2014-02-09 NOTE — Progress Notes (Signed)
Arterial Duplex Lower Ext. Completed. Julicia Krieger, BS, RDMS, RVT  

## 2014-02-16 ENCOUNTER — Encounter: Payer: Self-pay | Admitting: *Deleted

## 2014-02-16 ENCOUNTER — Ambulatory Visit
Admission: RE | Admit: 2014-02-16 | Discharge: 2014-02-16 | Disposition: A | Discharge status: Home / Self Care | Payer: PRIVATE HEALTH INSURANCE | Source: Ambulatory Visit | Attending: Cardiovascular Disease | Admitting: Cardiovascular Disease

## 2014-02-16 DIAGNOSIS — I739 Peripheral vascular disease, unspecified: Secondary | ICD-10-CM

## 2014-02-17 LAB — BASIC METABOLIC PANEL
BUN: 16 mg/dL (ref 6–23)
CALCIUM: 10 mg/dL (ref 8.4–10.5)
CO2: 26 mEq/L (ref 19–32)
CREATININE: 1.05 mg/dL (ref 0.50–1.35)
Chloride: 105 mEq/L (ref 96–112)
GLUCOSE: 115 mg/dL — AB (ref 70–99)
Potassium: 4.5 mEq/L (ref 3.5–5.3)
Sodium: 142 mEq/L (ref 135–145)

## 2014-02-17 LAB — CBC
HCT: 41.8 % (ref 39.0–52.0)
Hemoglobin: 13.8 g/dL (ref 13.0–17.0)
MCH: 30.9 pg (ref 26.0–34.0)
MCHC: 33 g/dL (ref 30.0–36.0)
MCV: 93.7 fL (ref 78.0–100.0)
Platelets: 187 10*3/uL (ref 150–400)
RBC: 4.46 MIL/uL (ref 4.22–5.81)
RDW: 13.5 % (ref 11.5–15.5)
WBC: 6.6 10*3/uL (ref 4.0–10.5)

## 2014-02-17 LAB — PROTIME-INR
INR: 1 (ref <1.50)
PROTHROMBIN TIME: 13.2 s (ref 11.6–15.2)

## 2014-02-17 LAB — TSH: TSH: 1.878 u[IU]/mL (ref 0.350–4.500)

## 2014-02-17 LAB — APTT: aPTT: 30 seconds (ref 24–37)

## 2014-02-19 ENCOUNTER — Encounter: Payer: Self-pay | Admitting: *Deleted

## 2014-02-19 ENCOUNTER — Encounter (HOSPITAL_COMMUNITY): Payer: Self-pay

## 2014-02-22 ENCOUNTER — Ambulatory Visit (HOSPITAL_COMMUNITY)
Admission: RE | Admit: 2014-02-22 | Discharge: 2014-02-23 | Disposition: A | Discharge status: Home / Self Care | Payer: PRIVATE HEALTH INSURANCE | Source: Ambulatory Visit | Attending: Cardiovascular Disease | Admitting: Cardiovascular Disease

## 2014-02-22 ENCOUNTER — Encounter (HOSPITAL_COMMUNITY)
Admission: RE | Disposition: A | Discharge status: Home / Self Care | Payer: Self-pay | Source: Ambulatory Visit | Attending: Cardiovascular Disease

## 2014-02-22 ENCOUNTER — Encounter (HOSPITAL_COMMUNITY): Payer: Self-pay | Admitting: General Practice

## 2014-02-22 DIAGNOSIS — E1159 Type 2 diabetes mellitus with other circulatory complications: Secondary | ICD-10-CM

## 2014-02-22 DIAGNOSIS — Z87891 Personal history of nicotine dependence: Secondary | ICD-10-CM | POA: Diagnosis not present

## 2014-02-22 DIAGNOSIS — D689 Coagulation defect, unspecified: Secondary | ICD-10-CM

## 2014-02-22 DIAGNOSIS — Z7902 Long term (current) use of antithrombotics/antiplatelets: Secondary | ICD-10-CM | POA: Diagnosis not present

## 2014-02-22 DIAGNOSIS — Z7982 Long term (current) use of aspirin: Secondary | ICD-10-CM | POA: Insufficient documentation

## 2014-02-22 DIAGNOSIS — I739 Peripheral vascular disease, unspecified: Secondary | ICD-10-CM

## 2014-02-22 DIAGNOSIS — I10 Essential (primary) hypertension: Secondary | ICD-10-CM | POA: Diagnosis not present

## 2014-02-22 DIAGNOSIS — E1165 Type 2 diabetes mellitus with hyperglycemia: Secondary | ICD-10-CM | POA: Insufficient documentation

## 2014-02-22 DIAGNOSIS — E785 Hyperlipidemia, unspecified: Secondary | ICD-10-CM | POA: Diagnosis not present

## 2014-02-22 DIAGNOSIS — Z955 Presence of coronary angioplasty implant and graft: Secondary | ICD-10-CM | POA: Diagnosis not present

## 2014-02-22 DIAGNOSIS — I70211 Atherosclerosis of native arteries of extremities with intermittent claudication, right leg: Secondary | ICD-10-CM | POA: Diagnosis not present

## 2014-02-22 DIAGNOSIS — D696 Thrombocytopenia, unspecified: Secondary | ICD-10-CM | POA: Insufficient documentation

## 2014-02-22 DIAGNOSIS — E1151 Type 2 diabetes mellitus with diabetic peripheral angiopathy without gangrene: Secondary | ICD-10-CM | POA: Insufficient documentation

## 2014-02-22 DIAGNOSIS — Z79899 Other long term (current) drug therapy: Secondary | ICD-10-CM

## 2014-02-22 DIAGNOSIS — I2510 Atherosclerotic heart disease of native coronary artery without angina pectoris: Secondary | ICD-10-CM | POA: Diagnosis not present

## 2014-02-22 DIAGNOSIS — E663 Overweight: Secondary | ICD-10-CM | POA: Diagnosis not present

## 2014-02-22 DIAGNOSIS — IMO0002 Reserved for concepts with insufficient information to code with codable children: Secondary | ICD-10-CM

## 2014-02-22 DIAGNOSIS — I701 Atherosclerosis of renal artery: Secondary | ICD-10-CM

## 2014-02-22 HISTORY — DX: Shortness of breath: R06.02

## 2014-02-22 HISTORY — PX: ILIAC ARTERY STENT: SHX1786

## 2014-02-22 HISTORY — DX: Anxiety disorder, unspecified: F41.9

## 2014-02-22 LAB — GLUCOSE, CAPILLARY
GLUCOSE-CAPILLARY: 112 mg/dL — AB (ref 70–99)
GLUCOSE-CAPILLARY: 106 mg/dL — AB (ref 70–99)
GLUCOSE-CAPILLARY: 157 mg/dL — AB (ref 70–99)
GLUCOSE-CAPILLARY: 117 mg/dL — AB (ref 70–99)

## 2014-02-22 LAB — POCT ACTIVATED CLOTTING TIME
ACTIVATED CLOTTING TIME: 292 s
Activated Clotting Time: 236 seconds
Activated Clotting Time: 185 seconds
Activated Clotting Time: 169 seconds

## 2014-02-22 SURGERY — ABDOMINAL AORTAGRAM
Laterality: Right

## 2014-02-22 MED ORDER — TAMSULOSIN HCL 0.4 MG PO CAPS
0.4000 mg | ORAL_CAPSULE | Freq: Every day | ORAL | Status: DC
  Administered 2014-02-22: 19:00:00 0.4 mg via ORAL
  Filled 2014-02-22 (×2): qty 1

## 2014-02-22 MED ORDER — EZETIMIBE-SIMVASTATIN 10-40 MG PO TABS
1.0000 | ORAL_TABLET | ORAL | Status: DC
  Administered 2014-02-23: 1 via ORAL
  Filled 2014-02-22: qty 1

## 2014-02-22 MED ORDER — INFLUENZA VAC SPLIT QUAD 0.5 ML IM SUSY
0.5000 mL | PREFILLED_SYRINGE | Freq: Once | INTRAMUSCULAR | Status: DC
  Filled 2014-02-22 (×2): qty 0.5

## 2014-02-22 MED ORDER — MIDAZOLAM HCL 2 MG/2ML IJ SOLN
INTRAMUSCULAR | Status: AC
  Filled 2014-02-22: qty 2

## 2014-02-22 MED ORDER — FENTANYL CITRATE 0.05 MG/ML IJ SOLN
INTRAMUSCULAR | Status: AC
  Filled 2014-02-22: qty 2

## 2014-02-22 MED ORDER — ALPRAZOLAM 0.5 MG PO TABS
1.0000 mg | ORAL_TABLET | Freq: Two times a day (BID) | ORAL | Status: DC
  Administered 2014-02-22 – 2014-02-23 (×2): 1 mg via ORAL
  Filled 2014-02-22 (×2): qty 2

## 2014-02-22 MED ORDER — ONDANSETRON HCL 4 MG/2ML IJ SOLN: 4.0000 mg | Freq: Four times a day (QID) | INTRAMUSCULAR | Status: DC | PRN

## 2014-02-22 MED ORDER — ISOSORBIDE MONONITRATE 15 MG HALF TABLET
15.0000 mg | ORAL_TABLET | Freq: Every day | ORAL | Status: DC
  Administered 2014-02-22: 15 mg via ORAL
  Filled 2014-02-22 (×2): qty 1

## 2014-02-22 MED ORDER — SODIUM CHLORIDE 0.9 % IV SOLN: INTRAVENOUS | Status: AC

## 2014-02-22 MED ORDER — SODIUM CHLORIDE 0.9 % IJ SOLN: 3.0000 mL | INTRAMUSCULAR | Status: DC | PRN

## 2014-02-22 MED ORDER — METOPROLOL SUCCINATE ER 25 MG PO TB24
25.0000 mg | ORAL_TABLET | ORAL | Status: DC
  Administered 2014-02-23: 06:00:00 25 mg via ORAL
  Filled 2014-02-22 (×2): qty 1

## 2014-02-22 MED ORDER — CLOPIDOGREL BISULFATE 75 MG PO TABS: 75.0000 mg | ORAL_TABLET | Freq: Every day | ORAL | Status: DC

## 2014-02-22 MED ORDER — ASPIRIN EC 325 MG PO TBEC
325.0000 mg | DELAYED_RELEASE_TABLET | Freq: Every day | ORAL | Status: DC
  Administered 2014-02-23: 325 mg via ORAL
  Filled 2014-02-22: qty 1

## 2014-02-22 MED ORDER — MORPHINE SULFATE 2 MG/ML IJ SOLN: 2.0000 mg | INTRAMUSCULAR | Status: DC | PRN

## 2014-02-22 MED ORDER — HYDROCODONE-ACETAMINOPHEN 5-325 MG PO TABS: 1.0000 | ORAL_TABLET | Freq: Four times a day (QID) | ORAL | Status: DC | PRN

## 2014-02-22 MED ORDER — LIDOCAINE HCL (PF) 1 % IJ SOLN
INTRAMUSCULAR | Status: AC
  Filled 2014-02-22: qty 30

## 2014-02-22 MED ORDER — METOCLOPRAMIDE HCL 5 MG PO TABS
5.0000 mg | ORAL_TABLET | Freq: Every day | ORAL | Status: DC
  Administered 2014-02-23: 06:00:00 5 mg via ORAL
  Filled 2014-02-22 (×2): qty 1

## 2014-02-22 MED ORDER — CLOPIDOGREL BISULFATE 75 MG PO TABS
75.0000 mg | ORAL_TABLET | Freq: Every day | ORAL | Status: DC
  Administered 2014-02-22 – 2014-02-23 (×2): 75 mg via ORAL
  Filled 2014-02-22: qty 1

## 2014-02-22 MED ORDER — SODIUM CHLORIDE 0.9 % IV SOLN
INTRAVENOUS | Status: DC
  Administered 2014-02-22: 07:00:00 via INTRAVENOUS

## 2014-02-22 MED ORDER — METOCLOPRAMIDE HCL 5 MG PO TABS: 5.0000 mg | ORAL_TABLET | Freq: Every day | ORAL | Status: DC

## 2014-02-22 MED ORDER — HEPARIN SODIUM (PORCINE) 1000 UNIT/ML IJ SOLN
INTRAMUSCULAR | Status: AC
  Filled 2014-02-22: qty 1

## 2014-02-22 MED ORDER — ACETAMINOPHEN 325 MG PO TABS: 650.0000 mg | ORAL_TABLET | ORAL | Status: DC | PRN

## 2014-02-22 MED ORDER — CLOPIDOGREL BISULFATE 75 MG PO TABS
75.0000 mg | ORAL_TABLET | Freq: Every day | ORAL | Status: DC
  Filled 2014-02-22 (×3): qty 1

## 2014-02-22 MED ORDER — ASPIRIN 81 MG PO CHEW: 81.0000 mg | CHEWABLE_TABLET | ORAL | Status: DC

## 2014-02-22 MED ORDER — PREGABALIN 25 MG PO CAPS
75.0000 mg | ORAL_CAPSULE | Freq: Two times a day (BID) | ORAL | Status: DC
  Administered 2014-02-22 – 2014-02-23 (×2): 75 mg via ORAL
  Filled 2014-02-22 (×2): qty 3

## 2014-02-22 MED ORDER — HEPARIN (PORCINE) IN NACL 2-0.9 UNIT/ML-% IJ SOLN
INTRAMUSCULAR | Status: AC
  Filled 2014-02-22: qty 1000

## 2014-02-22 MED ORDER — PAROXETINE HCL 20 MG PO TABS
20.0000 mg | ORAL_TABLET | Freq: Every evening | ORAL | Status: DC
  Administered 2014-02-22: 20 mg via ORAL
  Filled 2014-02-22 (×2): qty 1

## 2014-02-22 MED ORDER — NITROGLYCERIN 0.2 MG/ML ON CALL CATH LAB
INTRAVENOUS | Status: AC
  Filled 2014-02-22: qty 1

## 2014-02-22 MED ORDER — ASPIRIN EC 81 MG PO TBEC: 81.0000 mg | DELAYED_RELEASE_TABLET | ORAL | Status: DC

## 2014-02-22 MED ORDER — PANTOPRAZOLE SODIUM 40 MG PO TBEC
40.0000 mg | DELAYED_RELEASE_TABLET | Freq: Every day | ORAL | Status: DC
  Administered 2014-02-22 – 2014-02-23 (×2): 40 mg via ORAL
  Filled 2014-02-22 (×2): qty 1

## 2014-02-22 NOTE — Progress Notes (Addendum)
ACT VALUE 169 OBTAINED AT 1455. SHEATH REMOVED FROM R GROIN TIP INTACT, NO COMPLICATIONS. PRESSURE HELD 30 MINUTES. GAUZE BANDAGE PLACED WITH OPSITE COVERING. NO C/O PAIN, URGE TO VOID REPORTED. URINAL OFFERED , UOP=150. HOB FLAT. ADVISED PT TO REMAIN FLAT IN BED WITH RLE IMMOBILE UNTIL INSTRUCTED OTHERWISE. DENIES ANY LOWER EXTREMITY DISCOMFORT.   Hob remains flat , cath site intACT, no hematoma, no bleeding. Alert oriented.

## 2014-02-22 NOTE — CV Procedure (Signed)
Marvin Leon is a 80 y.o. male    [id number] LOCATION:  FACILITY: [facility]  PHYSICIAN: Nanetta Batty, M.D. 1932/08/14   DATE OF PROCEDURE:  02/22/2014  DATE OF DISCHARGE:     PV Angiogram/Intervention    History obtained from chart review.The patient is a 80 year old, mildly overweight, married Caucasian male, father of 4, grandfather to 6 grandchildren who I last saw 78 months ago. He has a history of CAD status post previous PCI and stenting of all 3 of his heart vessels. His other problems include noninsulin-requiring diabetes, hypertension and hyperlipidemia. I last catheterized him December 18, 2008, during a rule out-MI admission revealing noncritical CAD with patent stents and normal LV function. He also had a documented 50% to 60% ostial right common iliac artery stenosis. We have been following his carotid Dopplers. He really denies claudication but has chronic pain in his lower extremities with some diabetic peripheral neuropathic symptoms as well. He says he "staggers" and is chronically weak. Lower extremity Dopplers performed a year ago revealed normal ABIs and carotid Dopplers showed no significant evidence of ICA stenosis.  Since he was seen 12 months ago he has been completely asymptomatic except for the development of right hip and lower extremity pain with ambulation. He did stop his Vytorin because of leg pain but the pain persisted and he suddenly has restarted his Vytorin. We will recheck a lipid liver profile. Recent lower extremity arterial Doppler studies performed in our office 03/16/13 revealed increase in his velocities from 304 400 cm/s. Because of his right hip claudication we'll proceed with angiography and potential intervention.     PROCEDURE DESCRIPTION:   The patient was brought to the second floor [facility] cath lab in the postabsorptive state. He was premedicated with Valium 5 mg by mouth, IV Versed and fentanyl. His right groinwas prepped and shaved in  usual sterile fashion. Xylocaine 1% was used for local anesthesia. A 5 French sheath was inserted into the right common femoral artery using standard Seldinger technique. The 5 French pigtail catheter was placed in the distal abdominal aorta, distal abdominal aortography and bilateral neck and jaw performed in the AP and oblique views. The pathologist for the entirety of the case. A retrograde aortic pressure was monitored during the case. A pullback gradient was performed with a 5 French endhole catheter after administration of 200 mcg of intra-arterial nitroglycerin via the SideArm sheath.   HEMODYNAMICS:    AO SYSTOLIC/AO DIASTOLIC: 160/66, pullback gradient equaled 130/58   Angiographic Data:   Abdominal aorta-there was an 80% ostial eccentric right common iliac artery stenosis with a significant poststenotic dilatation. The left common iliac artery appeared normal.  IMPRESSION:high-grade ostial right common iliac artery stenosis with a 40 mm pullback gradient after nitroglycerin provocation. We'll proceed with PTA and stenting using an ICast covered stent.  Procedure Description:the short 5 French sheath was exchanged over an Psychologist, educational for a 7 [place] destination sheath. The patient received 6000 units of heparin intravenously with an ACT of 292. A total of 128 cc of contrast was administered to the patient. The lesion was stented primarily with a 7 mm x 22 bili along I cast covered stent at nominal pressures. The ostium was then post dilated with an 8 X 2 cm balloon and the more proximal portion of the stent which protruded into the post stenotic dilated iliac was gently flared with a 10 mm x 2 cm balloon. The final angiographic result with reduction of 80% ostial  right common iliac artery stenosis to 0% residual. His temperature did approximate 2-3 mm beyond the origin. There was a nice flaring of the distal edge of the stent into the posterior moderately dilated iliac. Completion  angiography was performed using a pigtail catheter. The long 7 French sheath was exchanged over a wire for a short 7 [place] sheath and the patient left the lab in stable condition.  Final Impression: successful PTA and stenting using and ICast covered stent of a high-grade ostial right common iliac artery stenosis for lifestyle limiting claudication. The patient will be treated with dual antiplatelet therapy. The sheath will be removed once the ACT was below 170 pressure will be held. He'll be discharged in the morning and followup lower extremity arterial Doppler studies will be obtained and are [facility] office in one week followed by a return office visit.    Runell GessBERRY,Katheryn Culliton J. MD, [facility] 02/22/2014 11:15 AM

## 2014-02-22 NOTE — Interval H&P Note (Signed)
History and Physical Interval Note:  02/22/2014 10:08 AM  Marvin Leon  has presented today for surgery, with the diagnosis of pvd  The various methods of treatment have been discussed with the patient and family. After consideration of risks, benefits and other options for treatment, the patient has consented to  Procedure(s): LOWER EXTREMITY ANGIOGRAM (N/A) as a surgical intervention .  The patient's history has been reviewed, patient examined, no change in status, stable for surgery.  I have reviewed the patient's chart and labs.  Questions were answered to the patient's satisfaction.     Runell GessBERRY,Kayti Poss J

## 2014-02-22 NOTE — H&P (View-Only) (Signed)
   01/29/2014 Marvin Leon   09/16/1932  [id number]  Primary Physician WILSON,FRED HENRY, MD Primary Cardiologist: Aimee Timmons J. Heddy Vidana MD FACP,FACC,FAHA, FSCAI   HPI:  The patient is a 81-year-old, mildly overweight, married Caucasian male, father of 4, grandfather to 6 grandchildren who I last saw 12 months ago. He has a history of CAD status post previous PCI and stenting of all 3 of his heart vessels. His other problems include noninsulin-requiring diabetes, hypertension and hyperlipidemia. I last catheterized him December 18, 2008, during a rule out-MI admission revealing noncritical CAD with patent stents and normal LV function. He also had a documented 50% to 60% ostial right common iliac artery stenosis. We have been following his carotid Dopplers. He really denies claudication but has chronic pain in his lower extremities with some diabetic peripheral neuropathic symptoms as well. He says he "staggers" and is chronically weak. Lower extremity Dopplers performed a year ago revealed normal ABIs and carotid Dopplers showed no significant evidence of ICA stenosis.  Since he was seen 12 months ago he has been completely asymptomatic except for the development of right hip and lower extremity pain with ambulation. He did stop his Vytorin because of leg pain but the pain persisted and he suddenly has restarted his Vytorin. We will recheck a lipid liver profile. Recent lower extremity arterial Doppler studies performed in our office 03/16/13 revealed increase in his velocities from 304 400 cm/s.    Current Outpatient Prescriptions  Medication Sig Dispense Refill  . ALPRAZolam (XANAX) 1 MG tablet Take 1 mg by mouth 2 (two) times daily.        . aspirin EC 81 MG tablet Take 81 mg by mouth every morning.        . Cetirizine HCl 10 MG CAPS Take 10 mg by mouth every other day as needed (for allergies).       . cholecalciferol (VITAMIN D) 400 UNITS TABS tablet Take 400 Units by mouth 2 (two) times daily.        . clopidogrel (PLAVIX) 75 MG tablet TAKE 1 TABLET DAILY  30 tablet  4  . dexlansoprazole (DEXILANT) 60 MG capsule Take 60 mg by mouth daily before breakfast.       . diclofenac sodium (VOLTAREN) 1 % GEL Apply 2 g topically 4 (four) times daily.  20 g  0  . docusate sodium (COLACE) 100 MG capsule Take 100 mg by mouth daily.      . Ferrous Sulfate (IRON) 28 MG TABS Take 28 mg by mouth every other day.       . HYDROcodone-acetaminophen (NORCO/VICODIN) 5-325 MG per tablet Take 1 tablet by mouth every 6 (six) hours as needed for moderate pain.      . isosorbide mononitrate (IMDUR) 30 MG 24 hr tablet Take 15 mg by mouth at bedtime.        . metFORMIN (GLUCOPHAGE) 1000 MG tablet Take 1,000 mg by mouth 2 (two) times daily with a meal.      . metoCLOPramide (REGLAN) 5 MG tablet Take 5 mg by mouth daily.      . metoprolol (TOPROL-XL) 50 MG 24 hr tablet Take 25 mg by mouth every morning.       . Multiple Vitamins-Minerals (MULTIVITAMINS THER. W/MINERALS) TABS Take 1 tablet by mouth daily.        . niacin (NIASPAN) 750 MG CR tablet TAKE 2 TABLETS AT BEDTIME  60 tablet  10  . PARoxetine (PAXIL) 20 MG tablet Take 20   mg by mouth every evening.        . pregabalin (LYRICA) 75 MG capsule Take 1 capsule (75 mg total) by mouth 2 (two) times daily.  180 capsule  3  . Probiotic Product (ULTRAFLORA IMMUNE HEALTH PO) Take 1 tablet by mouth daily before breakfast.      . tamsulosin (FLOMAX) 0.4 MG CAPS Take 0.4 mg by mouth daily after supper.       . VYTORIN 10-40 MG per tablet TAKE 1 TABLET DAILY  30 tablet  8   No current facility-administered medications for this visit.    Allergies  Allergen Reactions  . Darvocet [Propoxyphene N-Acetaminophen] Nausea And Vomiting  . Penicillins Other (See Comments)    Reaction to penicillin and darvocet unknown  . Propoxyphene And Methadone Other (See Comments)    History   Social History  . Marital Status: Married    Spouse Name: Gladys    Number of Children: 4    . Years of Education: 8th   Occupational History  .     Social History Main Topics  . Smoking status: Former Smoker -- 3.00 packs/day for 20 years    Quit date: 05/18/1977  . Smokeless tobacco: Never Used  . Alcohol Use: No  . Drug Use: No  . Sexual Activity: No   Other Topics Concern  . Not on file   Social History Narrative   His father is deceased from a heart attack at age 90. His mother is deceased from congestive heart failure at age 95.  His mother also had rectal cancer, heart disease and diabetes. There is heart disease, diabetes and lung disease in his brothers. There is lung disease and psychological problems in a sister.     Review of Systems: General: negative for chills, fever, night sweats or weight changes.  Cardiovascular: negative for chest pain, dyspnea on exertion, edema, orthopnea, palpitations, paroxysmal nocturnal dyspnea or shortness of breath Dermatological: negative for rash Respiratory: negative for cough or wheezing Urologic: negative for hematuria Abdominal: negative for nausea, vomiting, diarrhea, bright red blood per rectum, melena, or hematemesis Neurologic: negative for visual changes, syncope, or dizziness All other systems reviewed and are otherwise negative except as noted above.    Blood pressure 100/60, pulse 65, height 5' 9" (1.753 m), weight 166 lb (75.297 kg).  General appearance: alert and no distress Neck: no adenopathy, no JVD, supple, symmetrical, trachea midline, thyroid not enlarged, symmetric, no tenderness/mass/nodules and soft bilateral carotid bruits Lungs: clear to auscultation bilaterally Heart: regular rate and rhythm, S1, S2 normal, no murmur, click, rub or gallop Extremities: extremities normal, atraumatic, no cyanosis or edema  EKG normal sinus rhythm at 60 without ST or T wave changes  ASSESSMENT AND PLAN:   HTN (hypertension) Controlled on current medications  PVD (peripheral vascular disease) History of to  60% ostial right common iliac artery stenosis documented at the time of cardiac catheterization 12/18/08. At that time he was not complaining of claudication however recently he developed right hip and lower extremity discomfort on ambulation with Dopplers to reveal normal ABI but increase in his common iliac callosities from 330 up to 401 cm/s. Based on this, I decided to proceed with angiography and potential intervention for lifestyle limiting claudication.  Hyperlipemia On statin therapy followed by his PCP  CAD (coronary artery disease) History of CAD status post PCI stenting of all 3 vessels in the past. I performed cardiac catheterization on him 12/18/08 revealed noncritical CAD with patent stents and normal   LV function. He denies chest pain or shortness of breath.      Runell GessJonathan J. Breion Novacek MD FACP,FACC,FAHA, [facility] 01/29/2014 4:43 PM

## 2014-02-22 NOTE — Progress Notes (Signed)
Transported to 6c per stretcher in no acute distress. No chest pain , no sob. Alert oriented. Cath site intact, no bleeding, no hematoma.. Report called to floor.

## 2014-02-23 ENCOUNTER — Other Ambulatory Visit: Payer: Self-pay | Admitting: Physician Assistant

## 2014-02-23 ENCOUNTER — Encounter (HOSPITAL_COMMUNITY): Payer: Self-pay | Admitting: Physician Assistant

## 2014-02-23 DIAGNOSIS — I10 Essential (primary) hypertension: Secondary | ICD-10-CM

## 2014-02-23 DIAGNOSIS — E1159 Type 2 diabetes mellitus with other circulatory complications: Secondary | ICD-10-CM

## 2014-02-23 DIAGNOSIS — I70211 Atherosclerosis of native arteries of extremities with intermittent claudication, right leg: Secondary | ICD-10-CM | POA: Diagnosis not present

## 2014-02-23 DIAGNOSIS — I739 Peripheral vascular disease, unspecified: Secondary | ICD-10-CM

## 2014-02-23 LAB — CBC
HCT: 35.4 % — ABNORMAL LOW (ref 39.0–52.0)
Hemoglobin: 11.7 g/dL — ABNORMAL LOW (ref 13.0–17.0)
MCH: 30.8 pg (ref 26.0–34.0)
MCHC: 33.1 g/dL (ref 30.0–36.0)
MCV: 93.2 fL (ref 78.0–100.0)
Platelets: 139 10*3/uL — ABNORMAL LOW (ref 150–400)
RBC: 3.8 MIL/uL — AB (ref 4.22–5.81)
RDW: 13.4 % (ref 11.5–15.5)
WBC: 5.7 10*3/uL (ref 4.0–10.5)

## 2014-02-23 LAB — BASIC METABOLIC PANEL
Anion gap: 12 (ref 5–15)
BUN: 13 mg/dL (ref 6–23)
CHLORIDE: 107 meq/L (ref 96–112)
CO2: 24 meq/L (ref 19–32)
Calcium: 9.9 mg/dL (ref 8.4–10.5)
Creatinine, Ser: 1.11 mg/dL (ref 0.50–1.35)
GFR calc non Af Amer: 61 mL/min — ABNORMAL LOW (ref >90)
GFR, EST AFRICAN AMERICAN: 71 mL/min — AB (ref >90)
Glucose, Bld: 120 mg/dL — ABNORMAL HIGH (ref 70–99)
POTASSIUM: 4.2 meq/L (ref 3.7–5.3)
Sodium: 143 mEq/L (ref 137–147)

## 2014-02-23 LAB — GLUCOSE, CAPILLARY: Glucose-Capillary: 109 mg/dL — ABNORMAL HIGH (ref 70–99)

## 2014-02-23 MED ORDER — CLOPIDOGREL BISULFATE 75 MG PO TABS: 75.0000 mg | ORAL_TABLET | Freq: Every day | ORAL | Status: DC

## 2014-02-23 MED ORDER — METFORMIN HCL 1000 MG PO TABS: 1000.0000 mg | ORAL_TABLET | Freq: Two times a day (BID) | ORAL | Status: DC

## 2014-02-23 NOTE — Discharge Instructions (Signed)
PLEASE REMEMBER TO BRING ALL OF YOUR MEDICATIONS TO EACH OF YOUR FOLLOW-UP OFFICE VISITS. ° °PLEASE ATTEND ALL SCHEDULED FOLLOW-UP APPOINTMENTS.  ° °Activity: Increase activity slowly as tolerated. You may shower, but no soaking baths (or swimming) for 1 week. No driving for 2 days. No lifting over 5 lbs for 1 week. No sexual activity for 1 week.  ° °You May Return to Work: in 1 week (if applicable) ° °Wound Care: You may wash cath site gently with soap and water. Keep cath site clean and dry. If you notice pain, swelling, bleeding or pus at your cath site, please call [phone]. ° ° ° °Cardiac Cath Site Care °Refer to this sheet in the next few weeks. These instructions provide you with information on caring for yourself after your procedure. Your caregiver may also give you more specific instructions. Your treatment has been planned according to current medical practices, but problems sometimes occur. Call your caregiver if you have any problems or questions after your procedure. °HOME CARE INSTRUCTIONS °· You may shower 24 hours after the procedure. Remove the bandage (dressing) and gently wash the site with plain soap and water. Gently pat the site dry.  °· Do not apply powder or lotion to the site.  °· Do not sit in a bathtub, swimming pool, or whirlpool for 5 to 7 days.  °· No bending, squatting, or lifting anything over 10 pounds (4.5 kg) as directed by your caregiver.  °· Inspect the site at least twice daily.  °· Do not drive home if you are discharged the same day of the procedure. Have someone else drive you.  °· You may drive 24 hours after the procedure unless otherwise instructed by your caregiver.  °What to expect: °· Any bruising will usually fade within 1 to 2 weeks.  °· Blood that collects in the tissue (hematoma) may be painful to the touch. It should usually decrease in size and tenderness within 1 to 2 weeks.  °SEEK IMMEDIATE MEDICAL CARE IF: °· You have unusual pain at the site or down the  affected limb.  °· You have redness, warmth, swelling, or pain at the site.  °· You have drainage (other than a small amount of blood on the dressing).  °· You have chills.  °· You have a fever or persistent symptoms for more than 72 hours.  °· You have a fever and your symptoms suddenly get worse.  °· Your leg becomes pale, cool, tingly, or numb.  °· You have heavy bleeding from the site. Hold pressure on the site.  °Document Released: 06/06/2010 Document Revised: 04/23/2011 Document Reviewed: 06/06/2010 °[employer]® Patient Information ©2012 [employer]. ° °

## 2014-02-23 NOTE — Progress Notes (Signed)
Patient Name: Marvin ShengDonald G Kuhrt Date of Encounter: 02/23/2014  Principal Problem:   Claudication Active Problems:   DM (diabetes mellitus) type II uncontrolled, periph vascular disorder   HTN (hypertension)    Patient Profile: 80 yo male with a history of hypertension, CAD, PVD was admitted 10/08 after PV angiogram.   SUBJECTIVE: No chest pain or SOB. No pain at cath site.   OBJECTIVE Filed Vitals:   02/22/14 2000 02/22/14 2002 02/23/14 0010 02/23/14 0502  BP:  151/79 96/41 110/58  Pulse: 79 78 74 72  Temp:  98 F (36.7 C) 97.4 F (36.3 C) 97.7 F (36.5 C)  TempSrc:  Oral Oral Oral  Resp:  18 18 18   Height:      Weight:   165 lb 9.1 oz (75.1 kg)   SpO2: 99% 98% 95% 97%    Intake/Output Summary (Last 24 hours) at 02/23/14 0658 Last data filed at 02/22/14 2000  Gross per 24 hour  Intake  482.5 ml  Output    800 ml  Net -317.5 ml   Filed Weights   02/22/14 0641 02/23/14 0010  Weight: 166 lb (75.297 kg) 165 lb 9.1 oz (75.1 kg)    PHYSICAL EXAM General: Well developed, well nourished, male in no acute distress. Head: Normocephalic, atraumatic.  Neck: Supple without bruits, JVD. Lungs:  Resp regular and unlabored, CTA. Heart: RRR, S1, S2, no S3, S4, or murmur; no rub. Abdomen: Soft, non-tender, non-distended, BS + x 4.  Extremities: No clubbing, cyanosis, edema. No hematoma right groin.  Neuro: Alert and oriented X 3. Moves all extremities spontaneously. Psych: Normal affect.  LABS: CBC:  Recent Labs  02/23/14 0409  WBC 5.7  HGB 11.7*  HCT 35.4*  MCV 93.2  PLT 139*   Basic Metabolic Panel:  Recent Labs  16/02/9609/09/15 0409  NA 143  K 4.2  CL 107  CO2 24  GLUCOSE 120*  BUN 13  CREATININE 1.11  CALCIUM 9.9       ECG:  Angiographic Data:  Abdominal aorta-there was an 80% ostial eccentric right common iliac artery stenosis with a significant poststenotic dilatation. The left common iliac artery appeared normal.  IMPRESSION:high-grade ostial  right common iliac artery stenosis with a 40 mm pullback gradient after nitroglycerin provocation. We'll proceed with PTA and stenting using an ICast covered stent.  Final Impression: successful PTA and stenting using and ICast covered stent of a high-grade ostial right common iliac artery stenosis for lifestyle limiting claudication. The patient will be treated with dual antiplatelet therapy. The sheath will be removed once the ACT was below 170 pressure will be held. He'll be discharged in the morning and followup lower extremity arterial Doppler studies will be obtained and are [facility] office in one week followed by a return office visit.  Current Medications:  . ALPRAZolam  1 mg Oral BID  . aspirin EC  325 mg Oral Daily  . clopidogrel  75 mg Oral Q breakfast  . ezetimibe-simvastatin  1 tablet Oral Once per day on Mon Wed Fri  . Influenza vac split quadrivalent PF  0.5 mL Intramuscular Once  . isosorbide mononitrate  15 mg Oral QHS  . metoCLOPramide  5 mg Oral QAC breakfast  . metoprolol succinate  25 mg Oral BH-q7a  . pantoprazole  40 mg Oral Daily  . PARoxetine  20 mg Oral QPM  . pregabalin  75 mg Oral BID  . tamsulosin  0.4 mg Oral QPC supper  ASSESSMENT AND PLAN: Principal Problem:  1. PV: Iliac artery stenosis now s/p PTA and stenting of right common iliac. He will need dual antiplatelet therapy with ASA and Plavix. [facility] Dr[facility]. Will also need ABI in the office that day.   2. DM (diabetes mellitus) type II uncontrolled, periph vascular disorder  3. HTN (hypertension): BP controlled overnight.   Signed, Theodore Demarkhonda Barrett , PA-C 6:58 AM 02/23/2014  I have personally seen and examined this patient with Theodore Demarkhonda Barrett, PA-C. I agree with the assessment and plan as outlined above. He is doing well post PTA and stenting of the right common iliac artery. No complaints. Vitals reviewed. I agree with the physical exam findings as above.  Discharge home today. F/U Dr. Allyson SabalBerry with ABI one week.  MCALHANY,CHRISTOPHER 02/23/2014 7:17 AM

## 2014-02-23 NOTE — Discharge Summary (Signed)
CARDIOLOGY DISCHARGE SUMMARY   Patient ID: Marvin Leon Gutt MRN: [id number] DOB/AGE: 78/01/1933 78 y.o.  Admit date: 02/22/2014 Discharge date: 02/23/2014  PCP: Pamelia HoitWILSON,FRED HENRY, MD Primary Cardiologist: Dr. Allyson SabalBerry  Primary Discharge Diagnosis:   Claudication - 7 mm x 22 mm long ICast covered stent to the right common iliac artery  Secondary Discharge Diagnosis:    DM (diabetes mellitus) type II uncontrolled, periph vascular disorder   HTN (hypertension)   Thrombocytopenia, mild  Procedures: Peripheral vascular catheterization, abdominal aortogram, bilateral iliac angiogram, PTA and stenting using an ICast covered stent   Hospital Course: Marvin Leon Denn is a 80 y.o. male with a history of CAD and noncritical PVD. He was seen in the office describing claudication symptoms and there was concern for progression of disease. He was scheduled for peripheral vascular catheterization and came to the hospital for the procedure on 10/08.  Procedure results are below. He had successful stenting to the right common iliac artery. He tolerated the procedure well. He was admitted overnight for hydration.  On 10/09, he was seen by Dr. Clifton JamesMcAlhany and all data were reviewed. His post procedure labs showed a mild anemia and mild thrombocytopenia, but he was asymptomatic with this. His vital signs were stable. His cath site was without hematoma or ecchymosis. No further inpatient workup was indicated and he is considered stable for discharge, to follow up as an outpatient.  Labs:   Lab Results  Component Value Date   WBC 5.7 02/23/2014   HGB 11.7* 02/23/2014   HCT 35.4* 02/23/2014   MCV 93.2 02/23/2014   PLT 139* 02/23/2014     Recent Labs Lab 02/23/14 0409  NA 143  K 4.2  CL 107  CO2 24  BUN 13  CREATININE 1.11  CALCIUM 9.9  GLUCOSE 120*      Radiology: Dg Chest 2 View 02/16/2014   CLINICAL DATA:  Preprocedural chest radiograph. Former smoker. History of hypertension and diabetes. Initial  encounter.  EXAM: CHEST  2 VIEW  COMPARISON:  02/24/2013  FINDINGS: Grossly unchanged cardiac silhouette and mediastinal contours with atherosclerotic plaque within the thoracic aorta. Post coronary artery stent placement. The lungs are hyperexpanded. No focal airspace opacities. No pleural effusion pneumothorax. No evidence of edema. No acute osseus abnormalities. Stigmata of DISH within the thoracic spine.  IMPRESSION: Hyperexpanded lungs without acute cardiopulmonary disease.   Electronically Signed   By: Simonne ComeJohn  Watts M.D.   On: 02/16/2014 15:26   PV Cath: 02/22/2014 Angiographic Data:  Abdominal aorta-there was an 80% ostial eccentric right common iliac artery stenosis with a significant poststenotic dilatation. The left common iliac artery appeared normal.  IMPRESSION:high-grade ostial right common iliac artery stenosis with a 40 mm pullback gradient after nitroglycerin provocation. We'll proceed with PTA and stenting using an ICast covered stent.  Procedure Description:the short 5 French sheath was exchanged over an Psychologist, educational035 VersaCore wire for a 7 [place] destination sheath. The patient received 6000 units of heparin intravenously with an ACT of 292. A total of 128 cc of contrast was administered to the patient. The lesion was stented primarily with a 7 mm x 22 bili along I cast covered stent at nominal pressures. The ostium was then post dilated with an 8 X 2 cm balloon and the more proximal portion of the stent which protruded into the post stenotic dilated iliac was gently flared with a 10 mm x 2 cm balloon. The final angiographic result with reduction of 80% ostial right common iliac artery  stenosis to 0% residual. His temperature did approximate 2-3 mm beyond the origin. There was a nice flaring of the distal edge of the stent into the posterior moderately dilated iliac. Completion angiography was performed using a pigtail catheter. The long 7 French sheath was exchanged over a wire for a short 7 [place]  sheath and the patient left the lab in stable condition.  Final Impression: successful PTA and stenting using and ICast covered stent of a high-grade ostial right common iliac artery stenosis for lifestyle limiting claudication. The patient will be treated with dual antiplatelet therapy. The sheath will be removed once the ACT was below 170 pressure will be held. He'll be discharged in the morning and followup lower extremity arterial Doppler studies will be obtained and are [facility] office in one week followed by a return office visit.  FOLLOW UP PLANS AND APPOINTMENTS Allergies  Allergen Reactions  . Darvocet [Propoxyphene N-Acetaminophen] Nausea And Vomiting  . Penicillins Other (See Comments)    Nausea and Vomiting     Medication List         ALPRAZolam 1 MG tablet  Commonly known as:  XANAX  Take 1 mg by mouth 2 (two) times daily.     aspirin EC 81 MG tablet  Take 81 mg by mouth every morning.     cetirizine 10 MG tablet  Commonly known as:  ZYRTEC  Take 5 mg by mouth daily.     cholecalciferol 400 UNITS Tabs tablet  Commonly known as:  VITAMIN D  Take 400 Units by mouth 2 (two) times daily.     clopidogrel 75 MG tablet  Commonly known as:  PLAVIX  Take 1 tablet (75 mg total) by mouth daily.     dexlansoprazole 60 MG capsule  Commonly known as:  DEXILANT  Take 60 mg by mouth daily before breakfast.     docusate sodium 100 MG capsule  Commonly known as:  COLACE  Take 200 mg by mouth daily.     ezetimibe-simvastatin 10-40 MG per tablet  Commonly known as:  VYTORIN  Take 1 tablet by mouth 3 (three) times a week. Mondays, Thursdays and Saturdays     HYDROcodone-acetaminophen 5-325 MG per tablet  Commonly known as:  NORCO/VICODIN  Take 1 tablet by mouth every 6 (six) hours as needed for moderate pain.     Iron 28 MG Tabs  Take 28 mg by mouth once a week. Wednesdays     isosorbide mononitrate 30 MG 24 hr tablet  Commonly known as:  IMDUR  Take 15 mg by mouth at  bedtime.     metFORMIN 1000 MG tablet  Commonly known as:  GLUCOPHAGE  Take 1 tablet (1,000 mg total) by mouth 2 (two) times daily with a meal. HOLD for 2 days, restart on 02/25/2014     metoCLOPramide 10 MG tablet  Commonly known as:  REGLAN  Take 5 mg by mouth daily before breakfast.     metoCLOPramide 5 MG tablet  Commonly known as:  REGLAN  Take 5 mg by mouth daily.     metoprolol succinate 50 MG 24 hr tablet  Commonly known as:  TOPROL-XL  Take 25 mg by mouth every morning.     multivitamins ther. w/minerals Tabs tablet  Take 1 tablet by mouth daily.     niacin 750 MG CR tablet  Commonly known as:  NIASPAN  Take 750-1,500 mg by mouth at bedtime. Alternates every 2 days - 1500mg  x 2 days, 750mg  x  2 days, 1500mg  x 2 days...Marland KitchenMarland Kitchen     PARoxetine 20 MG tablet  Commonly known as:  PAXIL  Take 20 mg by mouth every evening.     pregabalin 75 MG capsule  Commonly known as:  LYRICA  Take 1 capsule (75 mg total) by mouth 2 (two) times daily.     tamsulosin 0.4 MG Caps capsule  Commonly known as:  FLOMAX  Take 0.4 mg by mouth daily after supper.     ULTRAFLORA IMMUNE HEALTH PO  Take 1 tablet by mouth daily before breakfast.        Discharge Instructions   Diet - low sodium heart healthy    Complete by:  As directed      Diet Carb Modified    Complete by:  As directed      Increase activity slowly    Complete by:  As directed           Follow-up Information   Follow up with [name], MD. (Follow up Doppler studies and an office visit, the office will call.)    Specialty:  Cardiology   Contact information:   [address] [place] [address] [phone]       BRING ALL MEDICATIONS WITH YOU TO FOLLOW UP APPOINTMENTS  Time spent with patient to include physician time: 38 min Signed: Theodore Demark, PA-C 02/23/2014, 9:00 AM Co-Sign MD

## 2014-02-23 NOTE — Care Management Note (Addendum)
  Page 1 of 1   02/23/2014     11:27:25 AM CARE MANAGEMENT NOTE 02/23/2014  Patient:  Burnett ShengMABE,Haik G   Account Number:  [id number]  Date Initiated:  02/23/2014  Documentation initiated by:  Eveline Sauve  Subjective/Objective Assessment:   PVD     Action/Plan:   CM to follow for dispositon needs   Anticipated DC Date:  02/23/2014   Anticipated DC Plan:  HOME/SELF CARE         Choice offered to / List presented to:             Status of service:  Completed, signed off Medicare Important Message given?   (If response is "NO", the following Medicare IM given date fields will be blank) Date Medicare IM given:   Medicare IM given by:   Date Additional Medicare IM given:   Additional Medicare IM given by:    Discharge Disposition:  HOME/SELF CARE  Per UR Regulation:    If discussed at Long Length of Stay Meetings, dates discussed:    Comments:  Eashan Schipani RN, BSN, MSHL, CCM  Nurse - Case Manager,  (Unit [phone])  [phone]  02/23/2014 Med Review:  Plavix Dispo:  home self care.

## 2014-02-23 NOTE — Discharge Summary (Signed)
See full note this am. cdm 

## 2014-02-26 ENCOUNTER — Encounter (HOSPITAL_COMMUNITY): Payer: Self-pay | Admitting: *Deleted

## 2014-02-27 ENCOUNTER — Emergency Department (HOSPITAL_COMMUNITY): Payer: PRIVATE HEALTH INSURANCE

## 2014-02-27 ENCOUNTER — Telehealth: Payer: Self-pay | Admitting: *Deleted

## 2014-02-27 ENCOUNTER — Telehealth: Payer: Self-pay | Admitting: Cardiovascular Disease

## 2014-02-27 ENCOUNTER — Emergency Department (HOSPITAL_COMMUNITY)
Admission: EM | Admit: 2014-02-27 | Discharge: 2014-02-28 | Disposition: A | Discharge status: Home / Self Care | Payer: PRIVATE HEALTH INSURANCE | Attending: Emergency Medicine | Admitting: Emergency Medicine

## 2014-02-27 ENCOUNTER — Ambulatory Visit (HOSPITAL_COMMUNITY)
Admission: RE | Admit: 2014-02-27 | Discharge: 2014-02-27 | Disposition: A | Discharge status: Home / Self Care | Payer: PRIVATE HEALTH INSURANCE | Source: Ambulatory Visit | Attending: Cardiology | Admitting: Cardiology

## 2014-02-27 DIAGNOSIS — M25559 Pain in unspecified hip: Secondary | ICD-10-CM

## 2014-02-27 DIAGNOSIS — K219 Gastro-esophageal reflux disease without esophagitis: Secondary | ICD-10-CM | POA: Insufficient documentation

## 2014-02-27 DIAGNOSIS — Z79899 Other long term (current) drug therapy: Secondary | ICD-10-CM | POA: Insufficient documentation

## 2014-02-27 DIAGNOSIS — I10 Essential (primary) hypertension: Secondary | ICD-10-CM | POA: Diagnosis not present

## 2014-02-27 DIAGNOSIS — Z880 Allergy status to penicillin: Secondary | ICD-10-CM | POA: Insufficient documentation

## 2014-02-27 DIAGNOSIS — Z9861 Coronary angioplasty status: Secondary | ICD-10-CM | POA: Diagnosis not present

## 2014-02-27 DIAGNOSIS — J439 Emphysema, unspecified: Secondary | ICD-10-CM | POA: Diagnosis not present

## 2014-02-27 DIAGNOSIS — Z9889 Other specified postprocedural states: Secondary | ICD-10-CM | POA: Diagnosis not present

## 2014-02-27 DIAGNOSIS — M79604 Pain in right leg: Secondary | ICD-10-CM | POA: Insufficient documentation

## 2014-02-27 DIAGNOSIS — M25551 Pain in right hip: Secondary | ICD-10-CM | POA: Insufficient documentation

## 2014-02-27 DIAGNOSIS — I2510 Atherosclerotic heart disease of native coronary artery without angina pectoris: Secondary | ICD-10-CM | POA: Diagnosis not present

## 2014-02-27 DIAGNOSIS — Z7982 Long term (current) use of aspirin: Secondary | ICD-10-CM | POA: Insufficient documentation

## 2014-02-27 DIAGNOSIS — M1990 Unspecified osteoarthritis, unspecified site: Secondary | ICD-10-CM | POA: Diagnosis not present

## 2014-02-27 DIAGNOSIS — E119 Type 2 diabetes mellitus without complications: Secondary | ICD-10-CM | POA: Insufficient documentation

## 2014-02-27 DIAGNOSIS — Z87891 Personal history of nicotine dependence: Secondary | ICD-10-CM | POA: Insufficient documentation

## 2014-02-27 DIAGNOSIS — F419 Anxiety disorder, unspecified: Secondary | ICD-10-CM | POA: Diagnosis not present

## 2014-02-27 MED ORDER — KETOROLAC TROMETHAMINE 60 MG/2ML IM SOLN
60.0000 mg | Freq: Once | INTRAMUSCULAR | Status: AC
  Administered 2014-02-27: 60 mg via INTRAMUSCULAR
  Filled 2014-02-27: qty 2

## 2014-02-27 NOTE — Telephone Encounter (Signed)
Patient had angiogram and stent placed in right leg last week.  Patient has been in pain for the past 2 days and has had to take pain pills----pain is rated 8-9 and is in the top of the leg to the knee.  Does patient need to be seen?

## 2014-02-27 NOTE — Telephone Encounter (Signed)
Patient is complaining of extreme leg pain in his right upper thigh.  The pain has worsened since the procedure.  Last night the pain was an 8/10.  The pain worsens with exertion, but is present at rest.  No edema or color or temperature change.  The lower extremity arterial doppler done today was normal.  Good blood flow down entire leg.  I reviewed the case with Dr Herbie BaltimoreHarding.  He suggested going to the ER or contacting PCP.  I instructed patient and his daughter of the advise.  They will contact PCP on their way home from our office today.

## 2014-02-27 NOTE — ED Notes (Addendum)
Presents with right sided hip began a while ago, but has gotten worse since Sunday. Pt had STENT placed in illiac artery Oct 8, was seen at Dr. Hazle CocaBerry's office today, had a doppler done and told everything looked good. Pt still c/o pain in right hip down to knee.  Pain is worse with walking, better with lying down. Denies injury. Pain rated 8/10. CMS intact

## 2014-02-27 NOTE — Addendum Note (Signed)
Addended by: Freddi StarrMATHIS, Kaidan Harpster W on: 02/27/2014 10:28 AM   Modules accepted: Orders

## 2014-02-27 NOTE — Telephone Encounter (Signed)
Discussed with Abbe Amsterdamkathryn vogel, dr berry's nurse, Spoke with pt wife, the patient will come to the office today for doppler.

## 2014-02-27 NOTE — Progress Notes (Signed)
Right Lower Ext. Arterial Duplex Completed. Marvin Leon, BS, RDMS, RVT  

## 2014-02-28 NOTE — Discharge Instructions (Signed)

## 2014-02-28 NOTE — ED Notes (Signed)
Pt back from MRI 

## 2014-02-28 NOTE — ED Provider Notes (Signed)
CSN: [id number]     Arrival date & time 02/27/14  1725 History   First MD Initiated Contact with Patient 02/27/14 1957     Chief Complaint  Patient presents with  . Hip Pain     (Consider location/radiation/quality/duration/timing/severity/associated sxs/prior Treatment) HPI  Burnett ShengDonald G Pla is a 80 y.o. male who presents for evaluation of right hip pain. The pain started several weeks ago, and is persistent, despite being evaluated and treated by his PCP, and his interventional vascular surgeon. He had iliac artery stent placement done 6 days ago. It was thought that this might improve his discomfort, but it has not. He denies trauma, fever, chills, back, pain, weakness, dizziness, nausea, or vomiting. He is using Vicodin, without relief. There are no other known modifying factors.   Past Medical History  Diagnosis Date  . Arthritis   . GERD (gastroesophageal reflux disease)   . Hyperlipidemia   . Hypertension   . Coronary artery disease   . Carotid artery disease   . Peripheral arterial disease     50% ostial right common iliac artery stenosis without claudication  . Emphysema   . Shortness of breath   . Diabetes mellitus without complication     type 2  . Anxiety    Past Surgical History  Procedure Laterality Date  . Colonoscopy    . Cardiac catheterization      3 stents placed  . Cholecystectomy N/A 04/07/2013    Procedure: LAPAROSCOPIC CHOLECYSTECTOMY WITH INTRAOPERATIVE CHOLANGIOGRAM;  Surgeon: Robyne AskewPaul S Toth III, MD;  Location: [facility] OR;  Service: General;  Laterality: N/A;  . Iliac artery stent Right 02/22/2014    Dr. Allyson SabalBerry 7 mm x 22 mm  ICast covered stent  . Coronary angioplasty      all 3 heart vessels    Family History  Problem Relation Age of Onset  . Heart disease Mother   . Heart disease Father    History  Substance Use Topics  . Smoking status: Former Smoker -- 3.00 packs/day for 20 years    Quit date: 05/18/1977  . Smokeless tobacco: Never Used  .  Alcohol Use: No    Review of Systems  All other systems reviewed and are negative.     Allergies  Darvocet and Penicillins  Home Medications   Prior to Admission medications   Medication Sig Start Date End Date Taking? Authorizing Provider  ALPRAZolam Prudy Feeler(XANAX) 1 MG tablet Take 1 mg by mouth 2 (two) times daily.     Yes Historical Provider, MD  aspirin EC 81 MG tablet Take 81 mg by mouth every morning.     Yes Historical Provider, MD  cetirizine (ZYRTEC) 10 MG tablet Take 5 mg by mouth daily.   Yes Historical Provider, MD  cholecalciferol (VITAMIN D) 400 UNITS TABS tablet Take 400 Units by mouth 2 (two) times daily.    Yes Historical Provider, MD  clopidogrel (PLAVIX) 75 MG tablet Take 1 tablet (75 mg total) by mouth daily. 02/23/14  Yes Rhonda G Barrett, PA-C  dexlansoprazole (DEXILANT) 60 MG capsule Take 60 mg by mouth daily before breakfast.    Yes Historical Provider, MD  docusate sodium (COLACE) 100 MG capsule Take 200 mg by mouth daily.    Yes Historical Provider, MD  ezetimibe-simvastatin (VYTORIN) 10-40 MG per tablet Take 1 tablet by mouth 3 (three) times a week. Mondays, Thursdays and Saturdays   Yes Historical Provider, MD  Ferrous Sulfate (IRON) 28 MG TABS Take 28 mg by mouth once  a week. Wednesdays   Yes Historical Provider, MD  HYDROcodone-acetaminophen (NORCO/VICODIN) 5-325 MG per tablet Take 1 tablet by mouth every 6 (six) hours as needed for moderate pain.   Yes Historical Provider, MD  isosorbide mononitrate (IMDUR) 30 MG 24 hr tablet Take 15 mg by mouth at bedtime.     Yes Historical Provider, MD  metFORMIN (GLUCOPHAGE) 1000 MG tablet Take 1 tablet (1,000 mg total) by mouth 2 (two) times daily with a meal. HOLD for 2 days, restart on 02/25/2014 02/23/14  Yes Rhonda G Barrett, PA-C  metoCLOPramide (REGLAN) 10 MG tablet Take 5 mg by mouth daily before breakfast.   Yes Historical Provider, MD  metoprolol (TOPROL-XL) 50 MG 24 hr tablet Take 25 mg by mouth every morning.    Yes  Historical Provider, MD  Multiple Vitamins-Minerals (MULTIVITAMINS THER. W/MINERALS) TABS Take 1 tablet by mouth daily.     Yes Historical Provider, MD  niacin (NIASPAN) 750 MG CR tablet Take 750-1,500 mg by mouth at bedtime. Alternates every 2 days - 1500mg  x 2 days, 750mg  x 2 days, 1500mg  x 2 days.....   Yes Historical Provider, MD  PARoxetine (PAXIL) 20 MG tablet Take 20 mg by mouth every evening.     Yes Historical Provider, MD  pregabalin (LYRICA) 75 MG capsule Take 1 capsule (75 mg total) by mouth 2 (two) times daily. 12/05/13  Yes Ronal Fear, NP  Probiotic Product (ULTRAFLORA IMMUNE HEALTH PO) Take 1 tablet by mouth daily before breakfast.   Yes Historical Provider, MD  tamsulosin (FLOMAX) 0.4 MG CAPS Take 0.4 mg by mouth daily after supper.  10/11/12  Yes Historical Provider, MD   BP 143/61  Pulse 70  Temp(Src) 96.3 F (35.7 C) (Oral)  Resp 14  SpO2 98% Physical Exam  Nursing note and vitals reviewed. Constitutional: He is oriented to person, place, and time. He appears well-developed and well-nourished.  HENT:  Head: Normocephalic and atraumatic.  Right Ear: External ear normal.  Left Ear: External ear normal.  Eyes: Conjunctivae and EOM are normal. Pupils are equal, round, and reactive to light.  Neck: Normal range of motion and phonation normal. Neck supple.  Cardiovascular: Normal rate, regular rhythm and normal heart sounds.   Pulmonary/Chest: Effort normal and breath sounds normal. He exhibits no bony tenderness.  Abdominal: Soft. There is no tenderness.  Musculoskeletal: Normal range of motion.  Normal active and passive range of motion of both hips. Normal range of motion lumbar spine.  Neurological: He is alert and oriented to person, place, and time. No cranial nerve deficit or sensory deficit. He exhibits normal muscle tone. Coordination normal.  Skin: Skin is warm, dry and intact.  Psychiatric: He has a normal mood and affect. His behavior is normal. Judgment and  thought content normal.    ED Course  Procedures (including critical care time)  Medications  ketorolac (TORADOL) injection 60 mg (60 mg Intramuscular Given 02/27/14 2106)    Patient Vitals for the past 24 hrs:  BP Temp Temp src Pulse Resp SpO2  02/28/14 0030 143/61 mmHg - - 70 14 98 %  02/28/14 0000 142/64 mmHg - - 63 - 97 %  02/27/14 2330 146/60 mmHg - - 66 - 98 %  02/27/14 2300 148/67 mmHg - - 73 - 98 %  02/27/14 2257 164/66 mmHg - - - 18 99 %  02/27/14 2100 147/58 mmHg - - 64 16 97 %  02/27/14 2030 137/54 mmHg - - 65 17 96 %  02/27/14  1952 140/61 mmHg - - - 18 97 %  02/27/14 1735 132/78 mmHg 96.3 F (35.7 C) Oral 80 16 96 %    12:41 AM Reevaluation with update and discussion. After initial assessment and treatment, an updated evaluation reveals pain, resolved after Toradol. Findings discussed with patient and family members, all questions answered. Mayana Irigoyen L    Labs Review Labs Reviewed - No data to display  Imaging Review Dg Hip Complete Right  02/27/2014   CLINICAL DATA:  Right hip pain, no injury  EXAM: RIGHT HIP - COMPLETE 2+ VIEW  COMPARISON:  09/12/2013; 02/22/2013  FINDINGS: No fracture or dislocation. Mild right hip degenerative change with joint space loss, subchondral sclerosis and minimal osteophytosis. Scattered adjacent vascular calcifications. Regional soft tissues appear otherwise normal.  IMPRESSION: 1. No acute findings. 2. Mild degenerative change the right hip.   Electronically Signed   By: Simonne ComeJohn  Watts M.D.   On: 02/27/2014 18:57   Mr Hip Right Wo Contrast  02/28/2014   CLINICAL DATA:  Extreme pain in the right thigh and hip. Initial encounter.  EXAM: MR OF THE RIGHT HIP WITHOUT CONTRAST  TECHNIQUE: Multiplanar, multisequence MR imaging was performed. No intravenous contrast was administered.  COMPARISON:  None.  FINDINGS: No acute fracture.  There is bilateral femoral head subchondral T2 hyperintensity and T1 hypointensity. On T1 weighted imaging  signal abnormality has a roughly geographic appearance and central fatty intensity. Appearance favors mild osteonecrosis, although no definitive double line sign seen on T2 weighted imaging. No subchondral fracture or collapse.  There is a band of T2 hyperintensity within the right gluteus maximus compatible with edema. Muscle architecture is preserved in the region of signal abnormality.  No hip joint effusion.  IMPRESSION: 1. No acute fracture. 2. 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 3. Small volume right gluteus maximus strain or muscle infarct.   Electronically Signed   By: Tiburcio PeaJonathan  Watts M.D.   On: 02/28/2014 00:36     EKG Interpretation None      MDM   Final diagnoses:  Hip pain   Nonspecific hip pain, without evidence for fracture, significant arthritis, or lumbar radiculopathy. He has mild, bilateral hip ED AVN, which may contribute to mild hip pain; family was informed of this, and will discuss it with the orthopedist.  Nursing Notes Reviewed/ Care Coordinated Applicable Imaging Reviewed Interpretation of Laboratory Data incorporated into ED treatment  The patient appears reasonably screened and/or stabilized for discharge and I doubt any other medical condition or other [facility] requiring further screening, evaluation, or treatment in the ED at this time prior to discharge.  Plan: Home Medications- usual; Home Treatments- rest; return here if the recommended treatment, does not improve the symptoms; Recommended follow up- Ortho f/u 1 week     Flint MelterElliott L Micca Matura, MD 02/28/14 [phone]

## 2014-03-01 ENCOUNTER — Telehealth: Payer: Self-pay | Admitting: Cardiovascular Disease

## 2014-03-01 NOTE — Telephone Encounter (Signed)
Closed encounter °

## 2014-03-08 ENCOUNTER — Encounter (HOSPITAL_COMMUNITY): Payer: PRIVATE HEALTH INSURANCE

## 2014-03-13 ENCOUNTER — Telehealth: Payer: Self-pay | Admitting: *Deleted

## 2014-03-13 ENCOUNTER — Encounter: Payer: Self-pay | Admitting: *Deleted

## 2014-03-13 DIAGNOSIS — I739 Peripheral vascular disease, unspecified: Secondary | ICD-10-CM

## 2014-03-13 NOTE — Telephone Encounter (Signed)
Order placed for repeat lower extremity arterial doppler in 6 months  

## 2014-03-13 NOTE — Telephone Encounter (Signed)
Message copied by Marella BileVOGEL, Ahja Martello W. on Tue Mar 13, 2014  1:22 PM ------      Message from: Runell GessBERRY, JONATHAN J      Created: Sat Mar 10, 2014  2:15 PM       No change from prior study. Repeat in 6 months ------

## 2014-03-20 ENCOUNTER — Ambulatory Visit (INDEPENDENT_AMBULATORY_CARE_PROVIDER_SITE_OTHER): Payer: PRIVATE HEALTH INSURANCE | Admitting: Cardiology

## 2014-03-20 ENCOUNTER — Encounter: Payer: Self-pay | Admitting: Cardiology

## 2014-03-20 VITALS — BP 112/64 | HR 75 | Ht 69.0 in | Wt 162.5 lb

## 2014-03-20 DIAGNOSIS — E785 Hyperlipidemia, unspecified: Secondary | ICD-10-CM

## 2014-03-20 DIAGNOSIS — I739 Peripheral vascular disease, unspecified: Secondary | ICD-10-CM

## 2014-03-20 DIAGNOSIS — IMO0002 Reserved for concepts with insufficient information to code with codable children: Secondary | ICD-10-CM

## 2014-03-20 DIAGNOSIS — I10 Essential (primary) hypertension: Secondary | ICD-10-CM

## 2014-03-20 DIAGNOSIS — E1165 Type 2 diabetes mellitus with hyperglycemia: Secondary | ICD-10-CM

## 2014-03-20 DIAGNOSIS — I2510 Atherosclerotic heart disease of native coronary artery without angina pectoris: Secondary | ICD-10-CM

## 2014-03-20 DIAGNOSIS — E1151 Type 2 diabetes mellitus with diabetic peripheral angiopathy without gangrene: Secondary | ICD-10-CM

## 2014-03-20 DIAGNOSIS — E1159 Type 2 diabetes mellitus with other circulatory complications: Secondary | ICD-10-CM

## 2014-03-20 NOTE — Patient Instructions (Signed)
OK to stop Plavix for Myelogram, resume when instructed by Dr Darrelyn HillockGioffre. See Dr Hope PigeonBeery in 6  months

## 2014-03-20 NOTE — Progress Notes (Signed)
03/20/2014 Marvin Leon   December 13, 1932  [id number]  Primary Physicia Pamelia HoitWILSON,FRED HENRY, MD Primary Cardiologist: Dr Allyson SabalBerry  HPI:  80 y/o followed at [facility] with a history of coronary artery disease with three-vessel intervention in 2001. In May 2001, he underwent circumflex and LAD stenting and after abnormal Myoview. He was then brought back in July 2001 for an RCA stent. Stents were patent in July 2003, and in 2010. He has been doing well from a cardiac standpoint. He has had no angina. He recently had claudication and subsequent PVA 02/22/14. He had high grade RCIA disease and underwent successful intervention with a DES by Dr Allyson SabalBerry. His doppler confirm patency of this site 02/27/14. Unfortunately he continues to have Rt leg pain. Dr Darrelyn HillockGioffre wants to do a myelogram and he will need to come off Plavix for this. I discussed this with Dr Allyson SabalBerry and he feels it would be safe to do this.    Current Outpatient Prescriptions  Medication Sig Dispense Refill  . ALPRAZolam (XANAX) 1 MG tablet Take 1 mg by mouth 2 (two) times daily.      Marland Kitchen. aspirin EC 81 MG tablet Take 81 mg by mouth every morning.      . cetirizine (ZYRTEC) 10 MG tablet Take 5 mg by mouth daily.    . cholecalciferol (VITAMIN D) 400 UNITS TABS tablet Take 400 Units by mouth 2 (two) times daily.     . clopidogrel (PLAVIX) 75 MG tablet Take 1 tablet (75 mg total) by mouth daily. 30 tablet 11  . dexlansoprazole (DEXILANT) 60 MG capsule Take 60 mg by mouth daily before breakfast.     . docusate sodium (COLACE) 100 MG capsule Take 200 mg by mouth daily.     Marland Kitchen. ezetimibe-simvastatin (VYTORIN) 10-40 MG per tablet Take 1 tablet by mouth 3 (three) times a week. Mondays, Thursdays and Saturdays    . Ferrous Sulfate (IRON) 28 MG TABS Take 28 mg by mouth once a week. Wednesdays    . HYDROcodone-acetaminophen (NORCO/VICODIN) 5-325 MG per tablet Take 1 tablet by mouth every 6 (six) hours as needed for moderate pain.    . isosorbide  mononitrate (IMDUR) 30 MG 24 hr tablet Take 15 mg by mouth at bedtime.      . metFORMIN (GLUCOPHAGE) 1000 MG tablet Take 1 tablet (1,000 mg total) by mouth 2 (two) times daily with a meal. HOLD for 2 days, restart on 02/25/2014    . metoCLOPramide (REGLAN) 10 MG tablet Take 5 mg by mouth daily before breakfast.    . metoprolol (TOPROL-XL) 50 MG 24 hr tablet Take 25 mg by mouth every morning.     . Multiple Vitamins-Minerals (MULTIVITAMINS THER. W/MINERALS) TABS Take 1 tablet by mouth daily.      . niacin (NIASPAN) 750 MG CR tablet Take 750-1,500 mg by mouth at bedtime. Alternates every 2 days - 1500mg  x 2 days, 750mg  x 2 days, 1500mg  x 2 days.....    Marland Kitchen. PARoxetine (PAXIL) 20 MG tablet Take 20 mg by mouth every evening.      . pregabalin (LYRICA) 75 MG capsule Take 1 capsule (75 mg total) by mouth 2 (two) times daily. 180 capsule 3  . Probiotic Product (ULTRAFLORA IMMUNE HEALTH PO) Take 1 tablet by mouth daily before breakfast.    . tamsulosin (FLOMAX) 0.4 MG CAPS Take 0.4 mg by mouth daily after supper.      No current facility-administered medications for this visit.    Allergies  Allergen Reactions  . Darvocet [Propoxyphene N-Acetaminophen] Nausea And Vomiting  . Penicillins Other (See Comments)    Nausea and Vomiting    History   Social History  . Marital Status: Married    Spouse Name: Marvin Leon    Number of Children: 4  . Years of Education: 8th   Occupational History  .     Social History Main Topics  . Smoking status: Former Smoker -- 3.00 packs/day for 20 years    Quit date: 05/18/1977  . Smokeless tobacco: Never Used  . Alcohol Use: No  . Drug Use: No  . Sexual Activity: No   Other Topics Concern  . Not on file   Social History Narrative   His father is deceased from a heart attack at age 78. His mother is deceased from congestive heart failure at age 78.  His mother also had rectal cancer, heart disease and diabetes. There is heart disease, diabetes and lung disease  in his brothers. There is lung disease and psychological problems in a sister.     Review of Systems: General: negative for chills, fever, night sweats or weight changes.  Cardiovascular: negative for chest pain, dyspnea on exertion, edema, orthopnea, palpitations, paroxysmal nocturnal dyspnea or shortness of breath Dermatological: negative for rash Respiratory: negative for cough or wheezing Urologic: negative for hematuria Abdominal: negative for nausea, vomiting, diarrhea, bright red blood per rectum, melena, or hematemesis Neurologic: negative for visual changes, syncope, or dizziness All other systems reviewed and are otherwise negative except as noted above.    Blood pressure 112/64, pulse 75, height 5\' 9"  (1.753 m), weight 162 lb 8 oz (73.71 kg).  General appearance: alert, cooperative and no distress Lungs: clear to auscultation bilaterally Heart: regular rate and rhythm  ASSESSMENT AND PLAN:   PVD (peripheral vascular disease) Rt CIA PTA/ stent 02/22/14. F/U dopplers OK  CAD (coronary artery disease) 3 V PTCA/ stenting in 2001. Patent at last cath 2010. No angina  DM (diabetes mellitus) type II uncontrolled, periph vascular disorder .  Hyperlipemia On Vytorin  HTN (hypertension) Controlled   PLAN  OK to stop Plavix for myelogram. F/U Dr Allyson SabalBerry in 6 months.  Eldra Word KPA-C 03/20/2014 11:47 AM

## 2014-03-20 NOTE — Assessment & Plan Note (Signed)
Controlled.  

## 2014-03-20 NOTE — Assessment & Plan Note (Addendum)
Rt CIA PTA/ stent 02/22/14. F/U dopplers OK

## 2014-03-20 NOTE — Assessment & Plan Note (Signed)
3 V PTCA/ stenting in 2001. Patent at last cath 2010. No angina

## 2014-03-20 NOTE — Assessment & Plan Note (Signed)
On Vytorin 

## 2014-03-23 ENCOUNTER — Other Ambulatory Visit: Payer: Self-pay | Admitting: Orthopedic Surgery

## 2014-03-23 DIAGNOSIS — M48061 Spinal stenosis, lumbar region without neurogenic claudication: Secondary | ICD-10-CM

## 2014-04-02 ENCOUNTER — Ambulatory Visit
Admission: RE | Admit: 2014-04-02 | Discharge: 2014-04-02 | Disposition: A | Discharge status: Home / Self Care | Payer: PRIVATE HEALTH INSURANCE | Source: Ambulatory Visit | Attending: Orthopedic Surgery | Admitting: Orthopedic Surgery

## 2014-04-02 DIAGNOSIS — M48061 Spinal stenosis, lumbar region without neurogenic claudication: Secondary | ICD-10-CM

## 2014-04-02 MED ORDER — IOHEXOL 180 MG/ML  SOLN
15.0000 mL | Freq: Once | INTRAMUSCULAR | Status: AC | PRN
  Administered 2014-04-02: 15 mL via INTRATHECAL

## 2014-04-02 MED ORDER — ONDANSETRON HCL 4 MG/2ML IJ SOLN
4.0000 mg | Freq: Once | INTRAMUSCULAR | Status: AC
  Administered 2014-04-02: 4 mg via INTRAMUSCULAR

## 2014-04-02 MED ORDER — MEPERIDINE HCL 100 MG/ML IJ SOLN
75.0000 mg | Freq: Once | INTRAMUSCULAR | Status: AC
  Administered 2014-04-02: 50 mg via INTRAMUSCULAR

## 2014-04-02 NOTE — Discharge Instructions (Signed)
Myelogram Discharge Instructions  1. Go home and rest quietly for the next 24 hours.  It is important to lie flat for the next 24 hours.  Get up only to go to the restroom.  You may lie in the bed or on a couch on your back, your stomach, your left side or your right side.  You may have one pillow under your head.  You may have pillows between your knees while you are on your side or under your knees while you are on your back.  2. DO NOT drive today.  Recline the seat as far back as it will go, while still wearing your seat belt, on the way home.  3. You may get up to go to the bathroom as needed.  You may sit up for 10 minutes to eat.  You may resume your normal diet and medications unless otherwise indicated.  Drink lots of extra fluids today and tomorrow.  4. The incidence of headache, nausea, or vomiting is about 5% (one in 20 patients).  If you develop a headache, lie flat and drink plenty of fluids until the headache goes away.  Caffeinated beverages may be helpful.  If you develop severe nausea and vomiting or a headache that does not go away with flat bed rest, call [phone].  5. You may resume normal activities after your 24 hours of bed rest is over; however, do not exert yourself strongly or do any heavy lifting tomorrow. If when you get up you have a headache when standing, go back to bed and force fluids for another 24 hours.  6. Call your physician for a follow-up appointment.  The results of your myelogram will be sent directly to your physician by the following day.  7. If you have any questions or if complications develop after you arrive home, please call [phone].  Discharge instructions have been explained to the patient.  The patient, or the person responsible for the patient, fully understands these instructions.      MAY RESUME PLAVIX TODAY.  May resume Reglan and Paxil on Nov. 17, 2015, after 11:00 am.

## 2014-04-02 NOTE — Progress Notes (Signed)
Pt and daughter states he has been off Plavix for the past 5 days. He has been off Paxil and Reglan for the past 2 days.  Discharge instructions explained to pt and his daughter.

## 2014-04-04 ENCOUNTER — Encounter: Payer: Self-pay | Admitting: Neurology

## 2014-04-10 ENCOUNTER — Encounter: Payer: Self-pay | Admitting: Neurology

## 2014-04-25 ENCOUNTER — Telehealth: Payer: Self-pay | Admitting: Cardiovascular Disease

## 2014-04-25 NOTE — Telephone Encounter (Signed)
Returned call to [employer] with Dr.Ramos's office.She stated patient will need to hold plavix 5 days prior to facet injection.Message sent to [facility] for advice.

## 2014-04-25 NOTE — Telephone Encounter (Signed)
Please call,need medical clarence for pt to stop his Plavix 5 days before injection.

## 2014-04-26 ENCOUNTER — Encounter: Payer: Self-pay | Admitting: *Deleted

## 2014-04-26 NOTE — Telephone Encounter (Signed)
OK to hold plavix for injection

## 2014-04-26 NOTE — Telephone Encounter (Signed)
Spoke to [employer]. Needs something in writing, injection scheduled for the 12/29. Fax [phone]

## 2014-05-02 NOTE — Telephone Encounter (Signed)
Letter signed and faxed

## 2014-06-04 ENCOUNTER — Inpatient Hospital Stay (HOSPITAL_COMMUNITY)
Admission: EM | Admit: 2014-06-04 | Discharge: 2014-06-21 | DRG: 870 | Disposition: A | Discharge status: Hospice | Payer: Medicare Other | Attending: Internal Medicine | Admitting: Internal Medicine

## 2014-06-04 ENCOUNTER — Emergency Department (HOSPITAL_COMMUNITY): Payer: Medicare Other

## 2014-06-04 ENCOUNTER — Inpatient Hospital Stay (HOSPITAL_COMMUNITY): Payer: Medicare Other

## 2014-06-04 ENCOUNTER — Encounter (HOSPITAL_COMMUNITY): Payer: Self-pay | Admitting: *Deleted

## 2014-06-04 DIAGNOSIS — E874 Mixed disorder of acid-base balance: Secondary | ICD-10-CM | POA: Diagnosis present

## 2014-06-04 DIAGNOSIS — I82629 Acute embolism and thrombosis of deep veins of unspecified upper extremity: Secondary | ICD-10-CM | POA: Diagnosis present

## 2014-06-04 DIAGNOSIS — I82622 Acute embolism and thrombosis of deep veins of left upper extremity: Secondary | ICD-10-CM | POA: Diagnosis not present

## 2014-06-04 DIAGNOSIS — E46 Unspecified protein-calorie malnutrition: Secondary | ICD-10-CM | POA: Diagnosis not present

## 2014-06-04 DIAGNOSIS — Z87891 Personal history of nicotine dependence: Secondary | ICD-10-CM

## 2014-06-04 DIAGNOSIS — Z515 Encounter for palliative care: Secondary | ICD-10-CM

## 2014-06-04 DIAGNOSIS — H269 Unspecified cataract: Secondary | ICD-10-CM | POA: Diagnosis present

## 2014-06-04 DIAGNOSIS — E785 Hyperlipidemia, unspecified: Secondary | ICD-10-CM | POA: Diagnosis present

## 2014-06-04 DIAGNOSIS — J9601 Acute respiratory failure with hypoxia: Secondary | ICD-10-CM | POA: Diagnosis present

## 2014-06-04 DIAGNOSIS — J9690 Respiratory failure, unspecified, unspecified whether with hypoxia or hypercapnia: Secondary | ICD-10-CM

## 2014-06-04 DIAGNOSIS — R778 Other specified abnormalities of plasma proteins: Secondary | ICD-10-CM | POA: Diagnosis present

## 2014-06-04 DIAGNOSIS — Z7982 Long term (current) use of aspirin: Secondary | ICD-10-CM

## 2014-06-04 DIAGNOSIS — E870 Hyperosmolality and hypernatremia: Secondary | ICD-10-CM | POA: Diagnosis not present

## 2014-06-04 DIAGNOSIS — I248 Other forms of acute ischemic heart disease: Secondary | ICD-10-CM | POA: Diagnosis present

## 2014-06-04 DIAGNOSIS — D696 Thrombocytopenia, unspecified: Secondary | ICD-10-CM | POA: Diagnosis present

## 2014-06-04 DIAGNOSIS — G9340 Encephalopathy, unspecified: Secondary | ICD-10-CM | POA: Diagnosis present

## 2014-06-04 DIAGNOSIS — R5381 Other malaise: Secondary | ICD-10-CM | POA: Diagnosis not present

## 2014-06-04 DIAGNOSIS — Z79899 Other long term (current) drug therapy: Secondary | ICD-10-CM | POA: Diagnosis not present

## 2014-06-04 DIAGNOSIS — N400 Enlarged prostate without lower urinary tract symptoms: Secondary | ICD-10-CM | POA: Diagnosis present

## 2014-06-04 DIAGNOSIS — R4702 Dysphasia: Secondary | ICD-10-CM | POA: Diagnosis not present

## 2014-06-04 DIAGNOSIS — K208 Other esophagitis: Secondary | ICD-10-CM | POA: Diagnosis present

## 2014-06-04 DIAGNOSIS — I5023 Acute on chronic systolic (congestive) heart failure: Secondary | ICD-10-CM | POA: Diagnosis not present

## 2014-06-04 DIAGNOSIS — N179 Acute kidney failure, unspecified: Secondary | ICD-10-CM | POA: Diagnosis present

## 2014-06-04 DIAGNOSIS — R6521 Severe sepsis with septic shock: Secondary | ICD-10-CM | POA: Diagnosis present

## 2014-06-04 DIAGNOSIS — Z66 Do not resuscitate: Secondary | ICD-10-CM | POA: Diagnosis not present

## 2014-06-04 DIAGNOSIS — K5900 Constipation, unspecified: Secondary | ICD-10-CM | POA: Diagnosis present

## 2014-06-04 DIAGNOSIS — Z95820 Peripheral vascular angioplasty status with implants and grafts: Secondary | ICD-10-CM | POA: Diagnosis not present

## 2014-06-04 DIAGNOSIS — F419 Anxiety disorder, unspecified: Secondary | ICD-10-CM | POA: Diagnosis present

## 2014-06-04 DIAGNOSIS — E1143 Type 2 diabetes mellitus with diabetic autonomic (poly)neuropathy: Secondary | ICD-10-CM | POA: Diagnosis present

## 2014-06-04 DIAGNOSIS — J439 Emphysema, unspecified: Secondary | ICD-10-CM | POA: Diagnosis present

## 2014-06-04 DIAGNOSIS — I739 Peripheral vascular disease, unspecified: Secondary | ICD-10-CM | POA: Diagnosis present

## 2014-06-04 DIAGNOSIS — K210 Gastro-esophageal reflux disease with esophagitis: Secondary | ICD-10-CM | POA: Diagnosis present

## 2014-06-04 DIAGNOSIS — J441 Chronic obstructive pulmonary disease with (acute) exacerbation: Secondary | ICD-10-CM | POA: Diagnosis present

## 2014-06-04 DIAGNOSIS — Z955 Presence of coronary angioplasty implant and graft: Secondary | ICD-10-CM

## 2014-06-04 DIAGNOSIS — E876 Hypokalemia: Secondary | ICD-10-CM | POA: Diagnosis not present

## 2014-06-04 DIAGNOSIS — R4182 Altered mental status, unspecified: Secondary | ICD-10-CM | POA: Diagnosis present

## 2014-06-04 DIAGNOSIS — N189 Chronic kidney disease, unspecified: Secondary | ICD-10-CM | POA: Diagnosis present

## 2014-06-04 DIAGNOSIS — K920 Hematemesis: Secondary | ICD-10-CM | POA: Diagnosis present

## 2014-06-04 DIAGNOSIS — I639 Cerebral infarction, unspecified: Secondary | ICD-10-CM | POA: Diagnosis not present

## 2014-06-04 DIAGNOSIS — R1310 Dysphagia, unspecified: Secondary | ICD-10-CM | POA: Insufficient documentation

## 2014-06-04 DIAGNOSIS — I2510 Atherosclerotic heart disease of native coronary artery without angina pectoris: Secondary | ICD-10-CM | POA: Diagnosis present

## 2014-06-04 DIAGNOSIS — D62 Acute posthemorrhagic anemia: Secondary | ICD-10-CM | POA: Diagnosis present

## 2014-06-04 DIAGNOSIS — I959 Hypotension, unspecified: Secondary | ICD-10-CM

## 2014-06-04 DIAGNOSIS — I5021 Acute systolic (congestive) heart failure: Secondary | ICD-10-CM | POA: Diagnosis present

## 2014-06-04 DIAGNOSIS — Z7902 Long term (current) use of antithrombotics/antiplatelets: Secondary | ICD-10-CM | POA: Diagnosis not present

## 2014-06-04 DIAGNOSIS — I429 Cardiomyopathy, unspecified: Secondary | ICD-10-CM | POA: Diagnosis present

## 2014-06-04 DIAGNOSIS — J9600 Acute respiratory failure, unspecified whether with hypoxia or hypercapnia: Secondary | ICD-10-CM | POA: Diagnosis present

## 2014-06-04 DIAGNOSIS — J690 Pneumonitis due to inhalation of food and vomit: Secondary | ICD-10-CM | POA: Insufficient documentation

## 2014-06-04 DIAGNOSIS — R0602 Shortness of breath: Secondary | ICD-10-CM | POA: Insufficient documentation

## 2014-06-04 DIAGNOSIS — I82419 Acute embolism and thrombosis of unspecified femoral vein: Secondary | ICD-10-CM | POA: Diagnosis present

## 2014-06-04 DIAGNOSIS — I471 Supraventricular tachycardia: Secondary | ICD-10-CM | POA: Diagnosis not present

## 2014-06-04 DIAGNOSIS — Z95828 Presence of other vascular implants and grafts: Secondary | ICD-10-CM | POA: Insufficient documentation

## 2014-06-04 DIAGNOSIS — R401 Stupor: Secondary | ICD-10-CM

## 2014-06-04 DIAGNOSIS — A419 Sepsis, unspecified organism: Secondary | ICD-10-CM | POA: Diagnosis present

## 2014-06-04 DIAGNOSIS — D631 Anemia in chronic kidney disease: Secondary | ICD-10-CM | POA: Insufficient documentation

## 2014-06-04 DIAGNOSIS — H5702 Anisocoria: Secondary | ICD-10-CM | POA: Diagnosis not present

## 2014-06-04 DIAGNOSIS — Z4659 Encounter for fitting and adjustment of other gastrointestinal appliance and device: Secondary | ICD-10-CM

## 2014-06-04 DIAGNOSIS — K3184 Gastroparesis: Secondary | ICD-10-CM | POA: Diagnosis present

## 2014-06-04 DIAGNOSIS — R579 Shock, unspecified: Secondary | ICD-10-CM

## 2014-06-04 DIAGNOSIS — K922 Gastrointestinal hemorrhage, unspecified: Secondary | ICD-10-CM | POA: Diagnosis not present

## 2014-06-04 DIAGNOSIS — I129 Hypertensive chronic kidney disease with stage 1 through stage 4 chronic kidney disease, or unspecified chronic kidney disease: Secondary | ICD-10-CM | POA: Diagnosis present

## 2014-06-04 DIAGNOSIS — R000 Tachycardia, unspecified: Secondary | ICD-10-CM | POA: Diagnosis present

## 2014-06-04 DIAGNOSIS — I5022 Chronic systolic (congestive) heart failure: Secondary | ICD-10-CM | POA: Diagnosis present

## 2014-06-04 DIAGNOSIS — R6520 Severe sepsis without septic shock: Secondary | ICD-10-CM

## 2014-06-04 DIAGNOSIS — R7989 Other specified abnormal findings of blood chemistry: Secondary | ICD-10-CM

## 2014-06-04 DIAGNOSIS — J438 Other emphysema: Secondary | ICD-10-CM | POA: Insufficient documentation

## 2014-06-04 LAB — CBG MONITORING, ED: Glucose-Capillary: 196 mg/dL — ABNORMAL HIGH (ref 70–99)

## 2014-06-04 LAB — COMPREHENSIVE METABOLIC PANEL
ALBUMIN: 2.7 g/dL — AB (ref 3.5–5.2)
ALK PHOS: 44 U/L (ref 39–117)
ALK PHOS: 41 U/L (ref 39–117)
ALT: 12 U/L (ref 0–53)
ALT: 12 U/L (ref 0–53)
AST: 32 U/L (ref 0–37)
AST: 33 U/L (ref 0–37)
Albumin: 2.6 g/dL — ABNORMAL LOW (ref 3.5–5.2)
Anion gap: 20 — ABNORMAL HIGH (ref 5–15)
Anion gap: 15 (ref 5–15)
BILIRUBIN TOTAL: 1.1 mg/dL (ref 0.3–1.2)
BUN: 37 mg/dL — AB (ref 6–23)
BUN: 37 mg/dL — AB (ref 6–23)
CALCIUM: 8.5 mg/dL (ref 8.4–10.5)
CHLORIDE: 103 meq/L (ref 96–112)
CO2: 19 mmol/L (ref 19–32)
CO2: 23 mmol/L (ref 19–32)
CREATININE: 1.8 mg/dL — AB (ref 0.50–1.35)
Calcium: 8.5 mg/dL (ref 8.4–10.5)
Chloride: 103 mEq/L (ref 96–112)
Creatinine, Ser: 1.85 mg/dL — ABNORMAL HIGH (ref 0.50–1.35)
GFR calc non Af Amer: 34 mL/min — ABNORMAL LOW (ref >90)
GFR, EST AFRICAN AMERICAN: 40 mL/min — AB (ref >90)
GFR, EST AFRICAN AMERICAN: 38 mL/min — AB (ref >90)
GFR, EST NON AFRICAN AMERICAN: 33 mL/min — AB (ref >90)
Glucose, Bld: 189 mg/dL — ABNORMAL HIGH (ref 70–99)
Glucose, Bld: 209 mg/dL — ABNORMAL HIGH (ref 70–99)
POTASSIUM: 4.4 mmol/L (ref 3.5–5.1)
Potassium: 3.9 mmol/L (ref 3.5–5.1)
Sodium: 142 mmol/L (ref 135–145)
Sodium: 141 mmol/L (ref 135–145)
TOTAL PROTEIN: 4.5 g/dL — AB (ref 6.0–8.3)
TOTAL PROTEIN: 4.5 g/dL — AB (ref 6.0–8.3)
Total Bilirubin: 0.8 mg/dL (ref 0.3–1.2)

## 2014-06-04 LAB — URINALYSIS, ROUTINE W REFLEX MICROSCOPIC
BILIRUBIN URINE: NEGATIVE
Bilirubin Urine: NEGATIVE
GLUCOSE, UA: NEGATIVE mg/dL
GLUCOSE, UA: 100 mg/dL — AB
Hgb urine dipstick: NEGATIVE
Ketones, ur: 40 mg/dL — AB
Ketones, ur: 15 mg/dL — AB
NITRITE: NEGATIVE
Nitrite: NEGATIVE
PROTEIN: NEGATIVE mg/dL
Protein, ur: NEGATIVE mg/dL
SPECIFIC GRAVITY, URINE: 1.021 (ref 1.005–1.030)
Specific Gravity, Urine: 1.023 (ref 1.005–1.030)
UROBILINOGEN UA: 1 mg/dL (ref 0.0–1.0)
Urobilinogen, UA: 1 mg/dL (ref 0.0–1.0)
pH: 6.5 (ref 5.0–8.0)
pH: 6 (ref 5.0–8.0)

## 2014-06-04 LAB — I-STAT ARTERIAL BLOOD GAS, ED
ACID-BASE DEFICIT: 5 mmol/L — AB (ref 0.0–2.0)
Acid-base deficit: 5 mmol/L — ABNORMAL HIGH (ref 0.0–2.0)
BICARBONATE: 21.6 meq/L (ref 20.0–24.0)
BICARBONATE: 20.5 meq/L (ref 20.0–24.0)
O2 SAT: 100 %
O2 Saturation: 88 %
PH ART: 7.33 — AB (ref 7.350–7.450)
TCO2: 23 mmol/L (ref 0–100)
TCO2: 22 mmol/L (ref 0–100)
pCO2 arterial: 46.4 mmHg — ABNORMAL HIGH (ref 35.0–45.0)
pCO2 arterial: 39.1 mmHg (ref 35.0–45.0)
pH, Arterial: 7.28 — ABNORMAL LOW (ref 7.350–7.450)
pO2, Arterial: 323 mmHg — ABNORMAL HIGH (ref 80.0–100.0)
pO2, Arterial: 60 mmHg — ABNORMAL LOW (ref 80.0–100.0)

## 2014-06-04 LAB — PROTIME-INR
INR: 1.28 (ref 0.00–1.49)
INR: 1.29 (ref 0.00–1.49)
PROTHROMBIN TIME: 16.2 s — AB (ref 11.6–15.2)
Prothrombin Time: 16.1 seconds — ABNORMAL HIGH (ref 11.6–15.2)

## 2014-06-04 LAB — I-STAT CHEM 8, ED
BUN: 39 mg/dL — AB (ref 6–23)
CREATININE: 1.4 mg/dL — AB (ref 0.50–1.35)
Calcium, Ion: 0.91 mmol/L — ABNORMAL LOW (ref 1.13–1.30)
Chloride: 104 mEq/L (ref 96–112)
Glucose, Bld: 197 mg/dL — ABNORMAL HIGH (ref 70–99)
HCT: 44 % (ref 39.0–52.0)
Hemoglobin: 15 g/dL (ref 13.0–17.0)
Potassium: 4.2 mmol/L (ref 3.5–5.1)
Sodium: 137 mmol/L (ref 135–145)
TCO2: 16 mmol/L (ref 0–100)

## 2014-06-04 LAB — CBC WITH DIFFERENTIAL/PLATELET
BASOS PCT: 0 % (ref 0–1)
Basophils Absolute: 0 10*3/uL (ref 0.0–0.1)
Basophils Absolute: 0 10*3/uL (ref 0.0–0.1)
Basophils Relative: 0 % (ref 0–1)
Eosinophils Absolute: 0 10*3/uL (ref 0.0–0.7)
Eosinophils Absolute: 0 10*3/uL (ref 0.0–0.7)
Eosinophils Relative: 0 % (ref 0–5)
Eosinophils Relative: 0 % (ref 0–5)
HCT: 39.2 % (ref 39.0–52.0)
HCT: 36.2 % — ABNORMAL LOW (ref 39.0–52.0)
HEMOGLOBIN: 13 g/dL (ref 13.0–17.0)
Hemoglobin: 11.9 g/dL — ABNORMAL LOW (ref 13.0–17.0)
LYMPHS ABS: 0.5 10*3/uL — AB (ref 0.7–4.0)
LYMPHS ABS: 0.6 10*3/uL — AB (ref 0.7–4.0)
Lymphocytes Relative: 12 % (ref 12–46)
Lymphocytes Relative: 18 % (ref 12–46)
MCH: 32.2 pg (ref 26.0–34.0)
MCH: 31.6 pg (ref 26.0–34.0)
MCHC: 33.2 g/dL (ref 30.0–36.0)
MCHC: 32.9 g/dL (ref 30.0–36.0)
MCV: 97 fL (ref 78.0–100.0)
MCV: 96.3 fL (ref 78.0–100.0)
MONOS PCT: 9 % (ref 3–12)
Monocytes Absolute: 0.4 10*3/uL (ref 0.1–1.0)
Monocytes Absolute: 0.3 10*3/uL (ref 0.1–1.0)
Monocytes Relative: 8 % (ref 3–12)
Neutro Abs: 3.3 10*3/uL (ref 1.7–7.7)
Neutro Abs: 2.5 10*3/uL (ref 1.7–7.7)
Neutrophils Relative %: 79 % — ABNORMAL HIGH (ref 43–77)
Neutrophils Relative %: 74 % (ref 43–77)
PLATELETS: 130 10*3/uL — AB (ref 150–400)
Platelets: 139 10*3/uL — ABNORMAL LOW (ref 150–400)
RBC: 4.04 MIL/uL — AB (ref 4.22–5.81)
RBC: 3.76 MIL/uL — ABNORMAL LOW (ref 4.22–5.81)
RDW: 13.3 % (ref 11.5–15.5)
RDW: 13.3 % (ref 11.5–15.5)
WBC: 4.3 10*3/uL (ref 4.0–10.5)
WBC: 3.4 10*3/uL — AB (ref 4.0–10.5)

## 2014-06-04 LAB — HEMOGLOBIN AND HEMATOCRIT, BLOOD
HCT: 34.7 % — ABNORMAL LOW (ref 39.0–52.0)
HCT: 25.1 % — ABNORMAL LOW (ref 39.0–52.0)
HEMATOCRIT: 29.7 % — AB (ref 39.0–52.0)
HEMOGLOBIN: 11.5 g/dL — AB (ref 13.0–17.0)
HEMOGLOBIN: 10.1 g/dL — AB (ref 13.0–17.0)
HEMOGLOBIN: 8.6 g/dL — AB (ref 13.0–17.0)

## 2014-06-04 LAB — AMMONIA: Ammonia: 12 umol/L (ref 11–32)

## 2014-06-04 LAB — ABO/RH: ABO/RH(D): O POS

## 2014-06-04 LAB — URINE MICROSCOPIC-ADD ON

## 2014-06-04 LAB — LACTIC ACID, PLASMA
LACTIC ACID, VENOUS: 6.4 mmol/L — AB (ref 0.5–2.2)
Lactic Acid, Venous: 6.9 mmol/L — ABNORMAL HIGH (ref 0.5–2.2)

## 2014-06-04 LAB — APTT: APTT: 30 s (ref 24–37)

## 2014-06-04 LAB — TROPONIN I
Troponin I: 0.03 ng/mL (ref <0.031)
Troponin I: 0.09 ng/mL — ABNORMAL HIGH (ref <0.031)

## 2014-06-04 LAB — GLUCOSE, CAPILLARY
GLUCOSE-CAPILLARY: 164 mg/dL — AB (ref 70–99)
GLUCOSE-CAPILLARY: 149 mg/dL — AB (ref 70–99)

## 2014-06-04 LAB — PROCALCITONIN: PROCALCITONIN: 9.08 ng/mL

## 2014-06-04 LAB — MRSA PCR SCREENING: MRSA BY PCR: NEGATIVE

## 2014-06-04 LAB — I-STAT CG4 LACTIC ACID, ED: Lactic Acid, Venous: 6.66 mmol/L — ABNORMAL HIGH (ref 0.5–2.2)

## 2014-06-04 LAB — BRAIN NATRIURETIC PEPTIDE: B Natriuretic Peptide: 81.4 pg/mL (ref 0.0–100.0)

## 2014-06-04 LAB — CORTISOL: CORTISOL PLASMA: 56.6 ug/dL

## 2014-06-04 LAB — MAGNESIUM: MAGNESIUM: 1.1 mg/dL — AB (ref 1.5–2.5)

## 2014-06-04 LAB — POC OCCULT BLOOD, ED: Fecal Occult Bld: NEGATIVE

## 2014-06-04 LAB — PREPARE RBC (CROSSMATCH)

## 2014-06-04 LAB — PHOSPHORUS: Phosphorus: 2.4 mg/dL (ref 2.3–4.6)

## 2014-06-04 MED ORDER — SODIUM CHLORIDE 0.9 % IV SOLN
Freq: Once | INTRAVENOUS | Status: AC
  Administered 2014-06-06: 17:00:00 via INTRAVENOUS

## 2014-06-04 MED ORDER — ETOMIDATE 2 MG/ML IV SOLN
INTRAVENOUS | Status: AC
  Filled 2014-06-04: qty 20

## 2014-06-04 MED ORDER — SODIUM CHLORIDE 0.9 % IV SOLN
INTRAVENOUS | Status: DC
  Administered 2014-06-04 – 2014-06-05 (×2): via INTRAVENOUS

## 2014-06-04 MED ORDER — SODIUM CHLORIDE 0.9 % IV SOLN
25.0000 ug/h | INTRAVENOUS | Status: DC
  Administered 2014-06-04: 50 ug/h via INTRAVENOUS
  Administered 2014-06-06: 150 ug/h via INTRAVENOUS
  Filled 2014-06-04 (×3): qty 50

## 2014-06-04 MED ORDER — SUCCINYLCHOLINE CHLORIDE 20 MG/ML IJ SOLN
INTRAMUSCULAR | Status: AC | PRN
  Administered 2014-06-04: 160 mg via INTRAVENOUS

## 2014-06-04 MED ORDER — PROPOFOL 10 MG/ML IV EMUL
INTRAVENOUS | Status: AC
Start: 2014-06-04 — End: 2014-06-04
  Filled 2014-06-04: qty 100

## 2014-06-04 MED ORDER — HYDROCORTISONE NA SUCCINATE PF 100 MG IJ SOLR
50.0000 mg | Freq: Four times a day (QID) | INTRAMUSCULAR | Status: DC
  Administered 2014-06-04 – 2014-06-05 (×3): 50 mg via INTRAVENOUS
  Filled 2014-06-04: qty 1
  Filled 2014-06-04 (×2): qty 2
  Filled 2014-06-04: qty 1
  Filled 2014-06-04: qty 2
  Filled 2014-06-04 (×2): qty 1

## 2014-06-04 MED ORDER — SODIUM CHLORIDE 0.9 % IV BOLUS (SEPSIS)
1000.0000 mL | Freq: Once | INTRAVENOUS | Status: AC
  Administered 2014-06-04: 1000 mL via INTRAVENOUS

## 2014-06-04 MED ORDER — LIDOCAINE HCL (CARDIAC) 20 MG/ML IV SOLN
INTRAVENOUS | Status: AC
  Filled 2014-06-04: qty 5

## 2014-06-04 MED ORDER — SODIUM CHLORIDE 0.9 % IV SOLN
1500.0000 mg | Freq: Once | INTRAVENOUS | Status: AC
  Administered 2014-06-04: 1500 mg via INTRAVENOUS
  Filled 2014-06-04: qty 1500

## 2014-06-04 MED ORDER — PANTOPRAZOLE SODIUM 40 MG IV SOLR: 40.0000 mg | Freq: Once | INTRAVENOUS | Status: DC

## 2014-06-04 MED ORDER — PANTOPRAZOLE SODIUM 40 MG IV SOLR: 40.0000 mg | Freq: Two times a day (BID) | INTRAVENOUS | Status: DC

## 2014-06-04 MED ORDER — INSULIN ASPART 100 UNIT/ML ~~LOC~~ SOLN
2.0000 [IU] | SUBCUTANEOUS | Status: DC
  Administered 2014-06-04: 4 [IU] via SUBCUTANEOUS
  Administered 2014-06-04 – 2014-06-07 (×5): 2 [IU] via SUBCUTANEOUS
  Administered 2014-06-07: 4 [IU] via SUBCUTANEOUS
  Administered 2014-06-07 (×2): 2 [IU] via SUBCUTANEOUS
  Administered 2014-06-08 (×2): 4 [IU] via SUBCUTANEOUS
  Administered 2014-06-08 (×2): 6 [IU] via SUBCUTANEOUS
  Administered 2014-06-08: 4 [IU] via SUBCUTANEOUS
  Administered 2014-06-09: 6 [IU] via SUBCUTANEOUS
  Administered 2014-06-09: 2 [IU] via SUBCUTANEOUS
  Administered 2014-06-09: 6 [IU] via SUBCUTANEOUS
  Administered 2014-06-09: 4 [IU] via SUBCUTANEOUS
  Administered 2014-06-09 (×2): 6 [IU] via SUBCUTANEOUS
  Administered 2014-06-10: 4 [IU] via SUBCUTANEOUS
  Administered 2014-06-10: 6 [IU] via SUBCUTANEOUS
  Administered 2014-06-10: 4 [IU] via SUBCUTANEOUS
  Administered 2014-06-10: 6 [IU] via SUBCUTANEOUS
  Administered 2014-06-10: 4 [IU] via SUBCUTANEOUS
  Administered 2014-06-10: 6 [IU] via SUBCUTANEOUS
  Administered 2014-06-10 – 2014-06-11 (×3): 4 [IU] via SUBCUTANEOUS
  Administered 2014-06-11: 2 [IU] via SUBCUTANEOUS
  Administered 2014-06-11 – 2014-06-12 (×2): 4 [IU] via SUBCUTANEOUS
  Administered 2014-06-12: 2 [IU] via SUBCUTANEOUS
  Administered 2014-06-12: 4 [IU] via SUBCUTANEOUS
  Administered 2014-06-12: 2 [IU] via SUBCUTANEOUS
  Administered 2014-06-12 (×2): 4 [IU] via SUBCUTANEOUS
  Administered 2014-06-12: 2 [IU] via SUBCUTANEOUS
  Administered 2014-06-13 (×5): 4 [IU] via SUBCUTANEOUS
  Administered 2014-06-14 – 2014-06-18 (×12): 2 [IU] via SUBCUTANEOUS

## 2014-06-04 MED ORDER — NOREPINEPHRINE BITARTRATE 1 MG/ML IV SOLN
5.0000 ug/min | INTRAVENOUS | Status: DC
  Administered 2014-06-05: 5 ug/min via INTRAVENOUS
  Filled 2014-06-04: qty 16

## 2014-06-04 MED ORDER — SODIUM CHLORIDE 0.9 % IV SOLN
1000.0000 mL | Freq: Once | INTRAVENOUS | Status: AC
  Administered 2014-06-04: 1000 mL via INTRAVENOUS

## 2014-06-04 MED ORDER — DEXTROSE 5 % IV SOLN
1.0000 g | INTRAVENOUS | Status: DC
  Administered 2014-06-04 – 2014-06-05 (×2): 1 g via INTRAVENOUS
  Filled 2014-06-04 (×3): qty 1

## 2014-06-04 MED ORDER — SODIUM CHLORIDE 0.9 % IV SOLN
25.0000 ug/h | INTRAVENOUS | Status: DC
  Filled 2014-06-04: qty 50

## 2014-06-04 MED ORDER — DEXTROSE 5 % IV SOLN
1.0000 g | Freq: Once | INTRAVENOUS | Status: DC
  Administered 2014-06-04: 1 g via INTRAVENOUS

## 2014-06-04 MED ORDER — MAGNESIUM SULFATE 2 GM/50ML IV SOLN
2.0000 g | Freq: Once | INTRAVENOUS | Status: AC
  Administered 2014-06-04: 2 g via INTRAVENOUS
  Filled 2014-06-04: qty 50

## 2014-06-04 MED ORDER — ACETAMINOPHEN 650 MG RE SUPP
650.0000 mg | Freq: Four times a day (QID) | RECTAL | Status: DC | PRN
  Administered 2014-06-04: 650 mg via RECTAL

## 2014-06-04 MED ORDER — CETYLPYRIDINIUM CHLORIDE 0.05 % MT LIQD
7.0000 mL | Freq: Four times a day (QID) | OROMUCOSAL | Status: DC
  Administered 2014-06-05 – 2014-06-11 (×26): 7 mL via OROMUCOSAL

## 2014-06-04 MED ORDER — PROPOFOL 10 MG/ML IV EMUL: 5.0000 ug/kg/min | INTRAVENOUS | Status: DC

## 2014-06-04 MED ORDER — SODIUM CHLORIDE 0.9 % IV SOLN
80.0000 mg | Freq: Once | INTRAVENOUS | Status: AC
  Administered 2014-06-04: 80 mg via INTRAVENOUS
  Filled 2014-06-04: qty 80

## 2014-06-04 MED ORDER — ACETAMINOPHEN 650 MG RE SUPP
650.0000 mg | Freq: Once | RECTAL | Status: DC
  Filled 2014-06-04: qty 1

## 2014-06-04 MED ORDER — PROPOFOL 10 MG/ML IV EMUL
5.0000 ug/kg/min | INTRAVENOUS | Status: DC
  Administered 2014-06-04: 10 ug/kg/min via INTRAVENOUS

## 2014-06-04 MED ORDER — ETOMIDATE 2 MG/ML IV SOLN
INTRAVENOUS | Status: AC | PRN
  Administered 2014-06-04: 20 mg via INTRAVENOUS

## 2014-06-04 MED ORDER — DEXMEDETOMIDINE HCL IN NACL 200 MCG/50ML IV SOLN
0.4000 ug/kg/h | INTRAVENOUS | Status: DC
  Administered 2014-06-04: 0.5 ug/kg/h via INTRAVENOUS
  Filled 2014-06-04: qty 50

## 2014-06-04 MED ORDER — SUCCINYLCHOLINE CHLORIDE 20 MG/ML IJ SOLN
INTRAMUSCULAR | Status: AC
  Filled 2014-06-04: qty 1

## 2014-06-04 MED ORDER — SODIUM CHLORIDE 0.9 % IV BOLUS (SEPSIS)
500.0000 mL | Freq: Once | INTRAVENOUS | Status: AC
  Administered 2014-06-04: 500 mL via INTRAVENOUS

## 2014-06-04 MED ORDER — MAGNESIUM SULFATE 4 GM/100ML IV SOLN
4.0000 g | INTRAVENOUS | Status: DC
  Filled 2014-06-04 (×2): qty 100

## 2014-06-04 MED ORDER — ROCURONIUM BROMIDE 50 MG/5ML IV SOLN
INTRAVENOUS | Status: AC
  Filled 2014-06-04: qty 2

## 2014-06-04 MED ORDER — SODIUM CHLORIDE 0.9 % IV SOLN
Freq: Once | INTRAVENOUS | Status: AC
  Administered 2014-06-04: 999 mL/h via INTRAVENOUS

## 2014-06-04 MED ORDER — METRONIDAZOLE IN NACL 5-0.79 MG/ML-% IV SOLN
500.0000 mg | Freq: Three times a day (TID) | INTRAVENOUS | Status: DC
  Administered 2014-06-04 – 2014-06-07 (×8): 500 mg via INTRAVENOUS
  Filled 2014-06-04 (×8): qty 100

## 2014-06-04 MED ORDER — PANTOPRAZOLE SODIUM 40 MG IV SOLR
8.0000 mg/h | INTRAVENOUS | Status: DC
  Administered 2014-06-04 – 2014-06-05 (×4): 8 mg/h via INTRAVENOUS
  Filled 2014-06-04 (×7): qty 80

## 2014-06-04 MED ORDER — DOBUTAMINE IN D5W 4-5 MG/ML-% IV SOLN
2.5000 ug/kg/min | INTRAVENOUS | Status: DC
  Filled 2014-06-04: qty 250

## 2014-06-04 MED ORDER — CHLORHEXIDINE GLUCONATE 0.12 % MT SOLN
15.0000 mL | Freq: Two times a day (BID) | OROMUCOSAL | Status: DC
  Administered 2014-06-04 – 2014-06-11 (×14): 15 mL via OROMUCOSAL
  Filled 2014-06-04 (×15): qty 15

## 2014-06-04 MED ORDER — PROPOFOL 10 MG/ML IV BOLUS
40.0000 mg | Freq: Once | INTRAVENOUS | Status: AC
  Administered 2014-06-04: 40 mg via INTRAVENOUS

## 2014-06-04 MED ORDER — METRONIDAZOLE IN NACL 5-0.79 MG/ML-% IV SOLN
500.0000 mg | Freq: Once | INTRAVENOUS | Status: AC
  Administered 2014-06-04: 500 mg via INTRAVENOUS
  Filled 2014-06-04: qty 100

## 2014-06-04 MED ORDER — SODIUM CHLORIDE 0.9 % IV SOLN
1000.0000 mL | INTRAVENOUS | Status: DC
  Administered 2014-06-04 – 2014-06-05 (×2): 1000 mL via INTRAVENOUS

## 2014-06-04 NOTE — ED Provider Notes (Signed)
CSN: [id number]     Arrival date & time 06/04/14  1036 History   First MD Initiated Contact with Patient 06/04/14 1039     Chief Complaint  Patient presents with  . Respiratory Distress  . GI Bleeding     (Consider location/radiation/quality/duration/timing/severity/associated sxs/prior Treatment) Patient is a 81 y.o. male presenting with vomiting and shortness of breath. The history is provided by the EMS personnel and a relative. No language interpreter was used.  Emesis Severity:  Moderate Duration:  1 day Timing:  Constant Emesis appearance: coffee ground. Progression:  Unchanged Chronicity:  New Recent urination:  Normal Relieved by:  Nothing Worsened by:  Nothing tried Ineffective treatments:  None tried Associated symptoms: no abdominal pain, no chills, no cough and no diarrhea   Shortness of Breath Severity:  Severe Onset quality:  Sudden Timing:  Constant Progression:  Unchanged Chronicity:  New Context comment:  Vomiting coffee ground emesis Relieved by:  Nothing Worsened by:  Nothing tried Ineffective treatments:  None tried Associated symptoms: cough, fever and vomiting   Associated symptoms: no abdominal pain     Past Medical History  Diagnosis Date  . Arthritis   . GERD (gastroesophageal reflux disease)   . Hyperlipidemia   . Hypertension   . Coronary artery disease   . Carotid artery disease   . Peripheral arterial disease     50% ostial right common iliac artery stenosis without claudication  . Emphysema   . Shortness of breath   . Diabetes mellitus without complication     type 2  . Anxiety    Past Surgical History  Procedure Laterality Date  . Colonoscopy    . Cardiac catheterization      3 stents placed  . Cholecystectomy N/A 04/07/2013    Procedure: LAPAROSCOPIC CHOLECYSTECTOMY WITH INTRAOPERATIVE CHOLANGIOGRAM;  Surgeon: Robyne Askew, MD;  Location: [facility] OR;  Service: General;  Laterality: N/A;  . Iliac artery stent Right 02/22/2014     Dr. Allyson Sabal 7 mm x 22 mm  ICast covered stent  . Coronary angioplasty      all 3 heart vessels    Family History  Problem Relation Age of Onset  . Heart disease Mother   . Heart disease Father    History  Substance Use Topics  . Smoking status: Former Smoker -- 3.00 packs/day for 20 years    Quit date: 05/18/1977  . Smokeless tobacco: Never Used  . Alcohol Use: No    Review of Systems  Unable to perform ROS: Acuity of condition  Constitutional: Positive for fever. Negative for chills.  Respiratory: Positive for cough and shortness of breath.   Gastrointestinal: Positive for vomiting. Negative for abdominal pain and diarrhea.      Allergies  Darvocet and Penicillins  Home Medications   Prior to Admission medications   Medication Sig Start Date End Date Taking? Authorizing Provider  ALPRAZolam Prudy Feeler) 1 MG tablet Take 1 mg by mouth 2 (two) times daily.     Yes Historical Provider, MD  aspirin EC 81 MG tablet Take 81 mg by mouth every morning.     Yes Historical Provider, MD  cetirizine (ZYRTEC) 10 MG tablet Take 5 mg by mouth daily.   Yes Historical Provider, MD  cholecalciferol (VITAMIN D) 400 UNITS TABS tablet Take 400 Units by mouth 2 (two) times daily.    Yes Historical Provider, MD  clopidogrel (PLAVIX) 75 MG tablet Take 1 tablet (75 mg total) by mouth daily. 02/23/14  Yes Bjorn Loser  G Barrett, PA-C  dexlansoprazole (DEXILANT) 60 MG capsule Take 60 mg by mouth daily before breakfast.    Yes Historical Provider, MD  docusate sodium (COLACE) 100 MG capsule Take 200 mg by mouth daily.    Yes Historical Provider, MD  ezetimibe-simvastatin (VYTORIN) 10-40 MG per tablet Take 1 tablet by mouth 3 (three) times a week. Mondays, Thursdays and Saturdays   Yes Historical Provider, MD  Ferrous Sulfate (IRON) 28 MG TABS Take 28 mg by mouth once a week. Wednesdays   Yes Historical Provider, MD  isosorbide mononitrate (IMDUR) 30 MG 24 hr tablet Take 15 mg by mouth at bedtime.     Yes  Historical Provider, MD  metFORMIN (GLUCOPHAGE) 1000 MG tablet Take 1 tablet (1,000 mg total) by mouth 2 (two) times daily with a meal. HOLD for 2 days, restart on 02/25/2014 02/23/14  Yes Rhonda G Barrett, PA-C  metoCLOPramide (REGLAN) 10 MG tablet Take 5 mg by mouth daily before breakfast.   Yes Historical Provider, MD  metoprolol (TOPROL-XL) 50 MG 24 hr tablet Take 25 mg by mouth every morning.    Yes Historical Provider, MD  morphine (MSIR) 15 MG tablet Take 15 mg by mouth 3 (three) times daily as needed for severe pain.   Yes Historical Provider, MD  Multiple Vitamins-Minerals (MULTIVITAMINS THER. W/MINERALS) TABS Take 1 tablet by mouth daily.     Yes Historical Provider, MD  niacin (NIASPAN) 750 MG CR tablet Take 750-1,500 mg by mouth at bedtime. Alternates every 2 days - 1500mg  x 2 days, 750mg  x 2 days, 1500mg  x 2 days.....   Yes Historical Provider, MD  PARoxetine (PAXIL) 20 MG tablet Take 20 mg by mouth every evening.     Yes Historical Provider, MD  pregabalin (LYRICA) 75 MG capsule Take 1 capsule (75 mg total) by mouth 2 (two) times daily. 12/05/13  Yes Ronal Fear, NP  Probiotic Product (ULTRAFLORA IMMUNE HEALTH PO) Take 1 tablet by mouth daily before breakfast.   Yes Historical Provider, MD  tamsulosin (FLOMAX) 0.4 MG CAPS Take 0.4 mg by mouth daily after supper.  10/11/12  Yes Historical Provider, MD   BP 83/47 mmHg  Pulse 133  Temp(Src) 102.2 F (39 C) (Rectal)  Resp 17  Wt 163 lb 2.3 oz (74 kg)  SpO2 99% Physical Exam  Constitutional: He appears listless. He appears cachectic. He appears ill. No distress.  HENT:  Head: Normocephalic and atraumatic.  Eyes: Pupils are equal, round, and reactive to light.  Conjunctival pallor  Neck: Normal range of motion.  Cardiovascular: Regular rhythm and normal heart sounds.  Tachycardia present.   Pulmonary/Chest: Accessory muscle usage present. Tachypnea noted. He is in respiratory distress. He has no decreased breath sounds. He has no  wheezes. He has rhonchi in the right upper field, the right middle field, the right lower field, the left upper field, the left middle field and the left lower field. He has no rales.  Abdominal: Soft. He exhibits no distension. There is no tenderness. There is no rebound and no guarding.  Musculoskeletal: He exhibits no edema or tenderness.  Neurological: He appears listless. He is disoriented. He exhibits normal muscle tone. GCS eye subscore is 3. GCS verbal subscore is 4. GCS motor subscore is 5.  Skin: Skin is warm and dry.  Nursing note and vitals reviewed.   ED Course  INTUBATION Date/Time: 06/04/2014 11:10 AM Performed by: Bethann Berkshire Authorized by: Bethann Berkshire Consent: The procedure was performed in an emergent situation.  Verbal consent obtained. Consent given by: daughters. Patient identity confirmed: anonymous protocol, patient vented/unresponsive Time out: Immediately prior to procedure a "time out" was called to verify the correct patient, procedure, equipment, support staff and site/side marked as required. Indications: respiratory failure Intubation method: direct Patient status: paralyzed (RSI) Preoxygenation: nonrebreather mask Sedatives: etomidate Paralytic: succinylcholine Laryngoscope size: Miller 3 Tube size: 7.5 mm Tube type: cuffed Number of attempts: 1 Cricoid pressure: no Cords visualized: yes Post-procedure assessment: chest rise and ETCO2 monitor Breath sounds: equal Cuff inflated: yes ETT to lip: 23 cm Tube secured with: ETT holder Chest x-ray interpreted by me. Chest x-ray findings: endotracheal tube too low Tube repositioned: tube repositioned successfully Patient tolerance: Patient tolerated the procedure well with no immediate complications   (including critical care time) Labs Review Labs Reviewed  CBC WITH DIFFERENTIAL - Abnormal; Notable for the following:    RBC 4.04 (*)    Platelets 139 (*)    Neutrophils Relative % 79 (*)     Lymphs Abs 0.5 (*)    All other components within normal limits  COMPREHENSIVE METABOLIC PANEL - Abnormal; Notable for the following:    Glucose, Bld 189 (*)    BUN 37 (*)    Creatinine, Ser 1.80 (*)    Total Protein 4.5 (*)    Albumin 2.7 (*)    GFR calc non Af Amer 34 (*)    GFR calc Af Amer 40 (*)    Anion gap 20 (*)    All other components within normal limits  LACTIC ACID, PLASMA - Abnormal; Notable for the following:    Lactic Acid, Venous 6.9 (*)    All other components within normal limits  PROTIME-INR - Abnormal; Notable for the following:    Prothrombin Time 16.1 (*)    All other components within normal limits  URINALYSIS, ROUTINE W REFLEX MICROSCOPIC - Abnormal; Notable for the following:    APPearance HAZY (*)    Ketones, ur 40 (*)    Leukocytes, UA SMALL (*)    All other components within normal limits  CBC WITH DIFFERENTIAL - Abnormal; Notable for the following:    WBC 3.4 (*)    RBC 3.76 (*)    Hemoglobin 11.9 (*)    HCT 36.2 (*)    Platelets 130 (*)    Lymphs Abs 0.6 (*)    All other components within normal limits  COMPREHENSIVE METABOLIC PANEL - Abnormal; Notable for the following:    Glucose, Bld 209 (*)    BUN 37 (*)    Creatinine, Ser 1.85 (*)    Total Protein 4.5 (*)    Albumin 2.6 (*)    GFR calc non Af Amer 33 (*)    GFR calc Af Amer 38 (*)    All other components within normal limits  PROTIME-INR - Abnormal; Notable for the following:    Prothrombin Time 16.2 (*)    All other components within normal limits  LACTIC ACID, PLASMA - Abnormal; Notable for the following:    Lactic Acid, Venous 6.4 (*)    All other components within normal limits  URINALYSIS, ROUTINE W REFLEX MICROSCOPIC - Abnormal; Notable for the following:    Glucose, UA 100 (*)    Hgb urine dipstick TRACE (*)    Ketones, ur 15 (*)    Leukocytes, UA TRACE (*)    All other components within normal limits  MAGNESIUM - Abnormal; Notable for the following:    Magnesium 1.1 (*)  All other components within normal limits  URINE MICROSCOPIC-ADD ON - Abnormal; Notable for the following:    Squamous Epithelial / LPF FEW (*)    Bacteria, UA MANY (*)    All other components within normal limits  HEMOGLOBIN AND HEMATOCRIT, BLOOD - Abnormal; Notable for the following:    Hemoglobin 11.5 (*)    HCT 34.7 (*)    All other components within normal limits  GLUCOSE, CAPILLARY - Abnormal; Notable for the following:    Glucose-Capillary 164 (*)    All other components within normal limits  URINE MICROSCOPIC-ADD ON - Abnormal; Notable for the following:    Bacteria, UA MANY (*)    All other components within normal limits  TROPONIN I - Abnormal; Notable for the following:    Troponin I 0.09 (*)    All other components within normal limits  HEMOGLOBIN AND HEMATOCRIT, BLOOD - Abnormal; Notable for the following:    Hemoglobin 10.1 (*)    HCT 29.7 (*)    All other components within normal limits  GLUCOSE, CAPILLARY - Abnormal; Notable for the following:    Glucose-Capillary 149 (*)    All other components within normal limits  I-STAT CHEM 8, ED - Abnormal; Notable for the following:    BUN 39 (*)    Creatinine, Ser 1.40 (*)    Glucose, Bld 197 (*)    Calcium, Ion 0.91 (*)    All other components within normal limits  I-STAT CG4 LACTIC ACID, ED - Abnormal; Notable for the following:    Lactic Acid, Venous 6.66 (*)    All other components within normal limits  CBG MONITORING, ED - Abnormal; Notable for the following:    Glucose-Capillary 196 (*)    All other components within normal limits  I-STAT ARTERIAL BLOOD GAS, ED - Abnormal; Notable for the following:    pH, Arterial 7.280 (*)    pCO2 arterial 46.4 (*)    pO2, Arterial 323.0 (*)    Acid-base deficit 5.0 (*)    All other components within normal limits  I-STAT ARTERIAL BLOOD GAS, ED - Abnormal; Notable for the following:    pH, Arterial 7.330 (*)    pO2, Arterial 60.0 (*)    Acid-base deficit 5.0 (*)    All  other components within normal limits  MRSA PCR SCREENING  CULTURE, BLOOD (ROUTINE X 2)  CULTURE, BLOOD (ROUTINE X 2)  CULTURE, BLOOD (ROUTINE X 2)  CULTURE, BLOOD (ROUTINE X 2)  URINE CULTURE  CULTURE, RESPIRATORY (NON-EXPECTORATED)  TROPONIN I  APTT  CORTISOL  BRAIN NATRIURETIC PEPTIDE  PHOSPHORUS  AMMONIA  PROCALCITONIN  CBC  BASIC METABOLIC PANEL  MAGNESIUM  PHOSPHORUS  PROCALCITONIN  HEMOGLOBIN AND HEMATOCRIT, BLOOD  TROPONIN I  MAGNESIUM  POC OCCULT BLOOD, ED  PREPARE RBC (CROSSMATCH)  TYPE AND SCREEN  ABO/RH    Imaging Review Ct Head Wo Contrast  06/04/2014   CLINICAL DATA:  Initial encounter for patient found unresponsive  EXAM: CT HEAD WITHOUT CONTRAST  TECHNIQUE: Contiguous axial images were obtained from the base of the skull through the vertex without intravenous contrast.  COMPARISON:  Outside MRI from 07/20/2009.  MRI from 05/04/2007.  FINDINGS: There is no evidence for acute hemorrhage, hydrocephalus, mass lesion, or abnormal extra-axial fluid collection. Diffuse loss of parenchymal volume is consistent with atrophy. No definite CT evidence for acute infarction. The visualized paranasal sinuses and mastoid air cells are clear.  IMPRESSION: Mild atrophy.  No acute intracranial abnormality.   Electronically Signed  By: Kennith Center M.D.   On: 06/04/2014 16:03   Dg Chest Port 1 View  06/04/2014   CLINICAL DATA:  Acute respiratory failure. Upper GI bleed. New endotracheal tube and central line placement.  EXAM: PORTABLE CHEST - 1 VIEW 1:25 p.m.  COMPARISON:  06/04/2014 at 11:08 a.m.  FINDINGS: Endotracheal tube is in good position at the level of the thoracic inlet. New left jugular vein catheter tip is in the superior vena cava above the cavoatrial junction in good position. New NG tube tip is below the diaphragm.  The heart size and pulmonary vascularity are normal. Right lung is clear. Slight haziness in the left lung could represent unilateral pulmonary edema.  Chronic bilateral apical pleural thickening.  IMPRESSION: Tubes and lines in good position. Hazy infiltrate in the left lung has improved and probably represents unilateral pulmonary edema.   Electronically Signed   By: Geanie Cooley M.D.   On: 06/04/2014 15:13   Dg Chest Portable 1 View  06/04/2014   CLINICAL DATA:  Intubation.  EXAM: PORTABLE CHEST - 1 VIEW  COMPARISON:  02/16/2014.  FINDINGS: Endotracheal tube noted with its tip 1.3 cm above the carina. Diffuse left lung infiltrate noted consistent with pneumonia. Right lung is clear. Heart size is normal. No acute bony abnormality.  IMPRESSION: 1. Endotracheal tube noted with its tip 1.3 cm above the carina. 2. Diffuse left lung pulmonary infiltrate.   Electronically Signed   By: Maisie Fus  Register   On: 06/04/2014 11:32     EKG Interpretation None      MDM   Final diagnoses:  Acute respiratory failure with hypoxia  Severe sepsis  Acute renal failure, unspecified acute renal failure type  Acute upper GI bleeding  Transient hypotension    Patient is a 81 year old Caucasian male with a pertinent past medical history of GERD and diabetes who comes to the emergency department today with difficulty breathing after an episode of coffee-ground emesis this morning. Physical exam as above. Upon arrival patient is significantly tachypneic, tachycardic, and altered as result I feel that he requires intubation. Patient was intubated as detailed above.  He was hyperthermic which is concerning for sepsis.  As result code sepsis was called. He was treated with vancomycin, cefepime, and Flagyl for a presumed aspiration pneumonia. Initial workup included a CBC, ABG, CMP, lactic acid, blood cultures 2, UA, urine culture, CBG, and a chest x-ray. Patient is not anticoagulated as result I do not feel he requires an INR at this time. Chest x-ray demonstrated diffuse left lung pulmonary infiltrates consistent with a pneumonia. POCT occult stool was negative. However  the patient does have significant coffee-ground emesis. An NG tube was placed with approximately a liter of coffee-ground emesis removed. As a result patient was treated with a unit of blood, 40 mg of protonix, and GI was consulted. Patient was initially sedated with propofol, however the patient developed hypotension as result a fentanyl drip was initiated. We will titrate up the fentanyl drip and titrate down the propofol as tolerated. Patient was treated with a total of 4 L of fluid which moderately improved the patient's tachycardia from the 140s down into the 110s. He had an initial lactic acid of 6.6. We will repeat this after fluid resuscitation. Patient was felt to require admission to the ICU and critical care was consulted. The patient was admitted to the ICU in a stable critical condition. Labs and imaging were reviewed by myself and considered in medical decision-making. Imaging was interpreted  by radiology. Care was discussed with my attending Dr. Jodi Mourning.      Bethann Berkshire, MD 06/04/14 [id number]  Enid Skeens, MD 06/05/14 [id number]

## 2014-06-04 NOTE — Procedures (Signed)
Central Venous Catheter Insertion Procedure Note Marvin Leon [id number] 26-Jan-1933  Procedure: Insertion of Central Venous Catheter Indications: Assessment of intravascular volume, Drug and/or fluid administration and Frequent blood sampling  Procedure Details Consent: Risks of procedure as well as the alternatives and risks of each were explained to the (patient/caregiver).  Consent for procedure obtained. Time Out: Verified patient identification, verified procedure, site/side was marked, verified correct patient position, special equipment/implants available, medications/allergies/relevent history reviewed, required imaging and test results available.  Performed  Maximum sterile technique was used including antiseptics, cap, gloves, gown, hand hygiene, mask and sheet. Skin prep: Chlorhexidine; local anesthetic administered A antimicrobial bonded/coated triple lumen catheter was placed in the left internal jugular vein using the Seldinger technique. Ultrasound guidance used.Yes.   Catheter placed to 20 cm. Blood aspirated via all 3 ports and then flushed x 3. Line sutured x 2 and dressing applied.  Evaluation Blood flow good Complications: No apparent complications Patient did tolerate procedure well. Chest X-ray ordered to verify placement.  CXR: pending.  Brett CanalesSteve Minor ACNP [facility] Pager [phone] till 3 pm If no answer page [phone] 06/04/2014, 1:28 PM  U/S used in placement.  I was present and supervised the entire procedure.  Alyson ReedyWesam G. Shanta Hartner, M.D. [facility]. Pager: [phone]. After hours pager: [phone].

## 2014-06-04 NOTE — Code Documentation (Signed)
Dr. Jodi MourningZavitz, Dr, Craige CottaSchmitt, and Heather respiratory at bedside attempting intubation. Procedure was discussed with family prior. Bilateral breath sounds, color change present.

## 2014-06-04 NOTE — Progress Notes (Signed)
Upon admission to unit patient was not restrained, ED nurse reports no restraints used.  Order discontinued.  Lars MageJuan Allee Busk RN

## 2014-06-04 NOTE — Consult Note (Signed)
CONSULTATION NOTE  Reason for Consult: GI Bleed, tachycardia  Requesting Physician: Dr. Nelda Marseille  Cardiologist: Dr. Gwenlyn Found  HPI: This is a 81 y.o. male with a past medical history significant for CAD with multivessel PCI in 2001, including PCI to the LCX and LAD following an abnormal myoview. Recently he was seen by Dr. Gwenlyn Found for claudication and he was found to have high grade right CIA disease and had successful DES PCI to the CIA in 02/2014. He was discharged on DAPT and followed up in the office in 03/2014. While blood flow was improved, he continued to have right leg pain and was being evaluated for a myelogram by Dr. Emiliano Dyer with orthopedics. It was felt to be okay to hold plavix as necessary for a procedure.  He now presents with acute upper GI bleeding and hematemesis, as wel  As altered mental status. He was intubated for airway protection. 1.5L of coffee-ground emesis returned after placement of an OGT. In the ER, he was noted to be in a regular tachycardia in the 160's, concerning for SVT, however, no clear onset or offset was noted. Regular morphology p-waves are noted, therefore, this may actually be a sinus tachycardia. Repeat EKG demonstrates a slower rate in the 140's which is clearly a sinus tachycardia.  He has no complaints of chest pain. Troponin is mildly elevated at 0.03. BNP is 81. Procalcitonin is very high at 9.08 and venous lactate is elevated in the 6's. K+ is 4.4 and magnesium is very low at 1.1.  PMHx:  Past Medical History  Diagnosis Date  . Arthritis   . GERD (gastroesophageal reflux disease)   . Hyperlipidemia   . Hypertension   . Coronary artery disease   . Carotid artery disease   . Peripheral arterial disease     50% ostial right common iliac artery stenosis without claudication  . Emphysema   . Shortness of breath   . Diabetes mellitus without complication     type 2  . Anxiety    Past Surgical History  Procedure Laterality Date  . Colonoscopy     . Cardiac catheterization      3 stents placed  . Cholecystectomy N/A 04/07/2013    Procedure: LAPAROSCOPIC CHOLECYSTECTOMY WITH INTRAOPERATIVE CHOLANGIOGRAM;  Surgeon: Merrie Roof, MD;  Location: [place];  Service: General;  Laterality: N/A;  . Iliac artery stent Right 02/22/2014    Dr. Gwenlyn Found 7 mm x 22 mm  ICast covered stent  . Coronary angioplasty      all 3 heart vessels     FAMHx: Family History  Problem Relation Age of Onset  . Heart disease Mother   . Heart disease Father     SOCHx:  reports that he quit smoking about 37 years ago. He has never used smokeless tobacco. He reports that he does not drink alcohol or use illicit drugs.  ALLERGIES: Allergies  Allergen Reactions  . Darvocet [Propoxyphene N-Acetaminophen] Nausea And Vomiting  . Penicillins Nausea And Vomiting    ROS: Review of systems not obtained due to patient factors.  HOME MEDICATIONS: Prescriptions prior to admission  Medication Sig Dispense Refill Last Dose  . ALPRAZolam (XANAX) 1 MG tablet Take 1 mg by mouth 2 (two) times daily.     06/03/2014 at Unknown time  . aspirin EC 81 MG tablet Take 81 mg by mouth every morning.     06/03/2014 at Unknown time  . cetirizine (ZYRTEC) 10 MG tablet Take 5 mg by mouth  daily.   06/03/2014 at Unknown time  . cholecalciferol (VITAMIN D) 400 UNITS TABS tablet Take 400 Units by mouth 2 (two) times daily.    06/03/2014 at Unknown time  . clopidogrel (PLAVIX) 75 MG tablet Take 1 tablet (75 mg total) by mouth daily. 30 tablet 11 06/03/2014 at Unknown time  . dexlansoprazole (DEXILANT) 60 MG capsule Take 60 mg by mouth daily before breakfast.    06/03/2014 at Unknown time  . docusate sodium (COLACE) 100 MG capsule Take 200 mg by mouth daily.    06/03/2014 at Unknown time  . ezetimibe-simvastatin (VYTORIN) 10-40 MG per tablet Take 1 tablet by mouth 3 (three) times a week. Mondays, Thursdays and Saturdays   Past Week at Unknown time  . Ferrous Sulfate (IRON) 28 MG TABS Take 28  mg by mouth once a week. Wednesdays   Past Week at Unknown time  . isosorbide mononitrate (IMDUR) 30 MG 24 hr tablet Take 15 mg by mouth at bedtime.     06/03/2014 at Unknown time  . metFORMIN (GLUCOPHAGE) 1000 MG tablet Take 1 tablet (1,000 mg total) by mouth 2 (two) times daily with a meal. HOLD for 2 days, restart on 02/25/2014   06/03/2014 at Unknown time  . metoCLOPramide (REGLAN) 10 MG tablet Take 5 mg by mouth daily before breakfast.   06/03/2014 at Unknown time  . metoprolol (TOPROL-XL) 50 MG 24 hr tablet Take 25 mg by mouth every morning.    06/03/2014 at 0800  . morphine (MSIR) 15 MG tablet Take 15 mg by mouth 3 (three) times daily as needed for severe pain.   06/04/2014 at 0600  . Multiple Vitamins-Minerals (MULTIVITAMINS THER. W/MINERALS) TABS Take 1 tablet by mouth daily.     06/03/2014 at Unknown time  . niacin (NIASPAN) 750 MG CR tablet Take 750-1,500 mg by mouth at bedtime. Alternates every 2 days - $Remo'1500mg'tYroN$  x 2 days, $Remov'750mg'ueNMOi$  x 2 days, $Remov'1500mg'UHHSqo$  x 2 days.....   06/03/2014 at Unknown time  . PARoxetine (PAXIL) 20 MG tablet Take 20 mg by mouth every evening.     06/03/2014 at Unknown time  . pregabalin (LYRICA) 75 MG capsule Take 1 capsule (75 mg total) by mouth 2 (two) times daily. 180 capsule 3 06/03/2014 at Unknown time  . Probiotic Product (ULTRAFLORA IMMUNE HEALTH PO) Take 1 tablet by mouth daily before breakfast.   06/03/2014 at Unknown time  . tamsulosin (FLOMAX) 0.4 MG CAPS Take 0.4 mg by mouth daily after supper.    06/03/2014 at Unknown time    HOSPITAL MEDICATIONS: Scheduled: . sodium chloride   Intravenous Once  . acetaminophen  650 mg Rectal Once  . ceFEPime (MAXIPIME) IV  1 g Intravenous Q24H  . etomidate      . hydrocortisone sodium succinate  50 mg Intravenous Q6H  . insulin aspart  2-6 Units Subcutaneous 6 times per day  . lidocaine (cardiac) 100 mg/36ml      . metronidazole  500 mg Intravenous Q8H  . propofol      . rocuronium      . succinylcholine        VITALS: Blood  pressure 119/56, pulse 153, temperature 102.1 F (38.9 C), temperature source Rectal, resp. rate 17, weight 163 lb 2.3 oz (74 kg), SpO2 95 %.  PHYSICAL EXAM: General appearance: intubated, lightly sedated on vent, opens eyes, does not follow commands, febrile Neck: no carotid bruit and no JVD Lungs: diminished breath sounds bilaterally Heart: regular tachycardia Abdomen: soft, non-tender; bowel sounds  normal; no masses,  no organomegaly Extremities: extremities normal, atraumatic, no cyanosis or edema and cool Pulses: 1+ pulses Skin: cool, dry, pale Neurologic: Mental status: lightly sedated on vent .  LABS: Results for orders placed or performed during the hospital encounter of 06/04/14 (from the past 48 hour(s))  CBG monitoring, ED     Status: Abnormal   Collection Time: 06/04/14 10:53 AM  Result Value Ref Range   Glucose-Capillary 196 (H) 70 - 99 mg/dL  I-Stat Chem 8, ED     Status: Abnormal   Collection Time: 06/04/14 11:00 AM  Result Value Ref Range   Sodium 137 135 - 145 mmol/L   Potassium 4.2 3.5 - 5.1 mmol/L   Chloride 104 96 - 112 mEq/L   BUN 39 (H) 6 - 23 mg/dL   Creatinine, Ser 1.40 (H) 0.50 - 1.35 mg/dL   Glucose, Bld 197 (H) 70 - 99 mg/dL   Calcium, Ion 0.91 (L) 1.13 - 1.30 mmol/L   TCO2 16 0 - 100 mmol/L   Hemoglobin 15.0 13.0 - 17.0 g/dL   HCT 44.0 39.0 - 52.0 %  I-Stat CG4 Lactic Acid, ED     Status: Abnormal   Collection Time: 06/04/14 11:00 AM  Result Value Ref Range   Lactic Acid, Venous 6.66 (H) 0.5 - 2.2 mmol/L  POC occult blood, ED     Status: None   Collection Time: 06/04/14 11:09 AM  Result Value Ref Range   Fecal Occult Bld NEGATIVE NEGATIVE  CBC with Differential     Status: Abnormal   Collection Time: 06/04/14 11:49 AM  Result Value Ref Range   WBC 4.3 4.0 - 10.5 K/uL   RBC 4.04 (L) 4.22 - 5.81 MIL/uL   Hemoglobin 13.0 13.0 - 17.0 g/dL   HCT 39.2 39.0 - 52.0 %   MCV 97.0 78.0 - 100.0 fL   MCH 32.2 26.0 - 34.0 pg   MCHC 33.2 30.0 - 36.0  g/dL   RDW 13.3 11.5 - 15.5 %   Platelets 139 (L) 150 - 400 K/uL   Neutrophils Relative % 79 (H) 43 - 77 %   Neutro Abs 3.3 1.7 - 7.7 K/uL   Lymphocytes Relative 12 12 - 46 %   Lymphs Abs 0.5 (L) 0.7 - 4.0 K/uL   Monocytes Relative 9 3 - 12 %   Monocytes Absolute 0.4 0.1 - 1.0 K/uL   Eosinophils Relative 0 0 - 5 %   Eosinophils Absolute 0.0 0.0 - 0.7 K/uL   Basophils Relative 0 0 - 1 %   Basophils Absolute 0.0 0.0 - 0.1 K/uL  Comprehensive metabolic panel     Status: Abnormal   Collection Time: 06/04/14 11:49 AM  Result Value Ref Range   Sodium 142 135 - 145 mmol/L    Comment: Please note change in reference range.   Potassium 3.9 3.5 - 5.1 mmol/L    Comment: Please note change in reference range.   Chloride 103 96 - 112 mEq/L   CO2 19 19 - 32 mmol/L   Glucose, Bld 189 (H) 70 - 99 mg/dL   BUN 37 (H) 6 - 23 mg/dL   Creatinine, Ser 1.80 (H) 0.50 - 1.35 mg/dL   Calcium 8.5 8.4 - 10.5 mg/dL   Total Protein 4.5 (L) 6.0 - 8.3 g/dL   Albumin 2.7 (L) 3.5 - 5.2 g/dL   AST 32 0 - 37 U/L   ALT 12 0 - 53 U/L   Alkaline Phosphatase 44 39 - 117  U/L   Total Bilirubin 1.1 0.3 - 1.2 mg/dL   GFR calc non Af Amer 34 (L) >90 mL/min   GFR calc Af Amer 40 (L) >90 mL/min    Comment: (NOTE) The eGFR has been calculated using the CKD EPI equation. This calculation has not been validated in all clinical situations. eGFR's persistently <90 mL/min signify possible Chronic Kidney Disease.    Anion gap 20 (H) 5 - 15  Protime-INR     Status: Abnormal   Collection Time: 06/04/14 11:49 AM  Result Value Ref Range   Prothrombin Time 16.1 (H) 11.6 - 15.2 seconds   INR 1.28 0.00 - 1.49  Troponin I     Status: None   Collection Time: 06/04/14 11:49 AM  Result Value Ref Range   Troponin I 0.03 <0.031 ng/mL    Comment:        NO INDICATION OF MYOCARDIAL INJURY. Please note change in reference range.   Lactic acid, plasma     Status: Abnormal   Collection Time: 06/04/14 11:50 AM  Result Value Ref  Range   Lactic Acid, Venous 6.9 (H) 0.5 - 2.2 mmol/L  Urinalysis, Routine w reflex microscopic     Status: Abnormal   Collection Time: 06/04/14 12:29 PM  Result Value Ref Range   Color, Urine YELLOW YELLOW   APPearance HAZY (A) CLEAR   Specific Gravity, Urine 1.023 1.005 - 1.030   pH 6.5 5.0 - 8.0   Glucose, UA NEGATIVE NEGATIVE mg/dL   Hgb urine dipstick NEGATIVE NEGATIVE   Bilirubin Urine NEGATIVE NEGATIVE   Ketones, ur 40 (A) NEGATIVE mg/dL   Protein, ur NEGATIVE NEGATIVE mg/dL   Urobilinogen, UA 1.0 0.0 - 1.0 mg/dL   Nitrite NEGATIVE NEGATIVE   Leukocytes, UA SMALL (A) NEGATIVE  Urine microscopic-add on     Status: Abnormal   Collection Time: 06/04/14 12:29 PM  Result Value Ref Range   Squamous Epithelial / LPF FEW (A) RARE   WBC, UA 7-10 <3 WBC/hpf   RBC / HPF 0-2 <3 RBC/hpf   Bacteria, UA MANY (A) RARE  Prepare RBC     Status: None   Collection Time: 06/04/14 12:41 PM  Result Value Ref Range   Order Confirmation ORDER PROCESSED BY BLOOD BANK   Type and screen for Red Blood Exchange     Status: None (Preliminary result)   Collection Time: 06/04/14 12:41 PM  Result Value Ref Range   ABO/RH(D) O POS    Antibody Screen NEG    Sample Expiration 06/07/2014    Unit Number [id number]    Blood Component Type RED CELLS,LR    Unit division 00    Status of Unit ALLOCATED    Transfusion Status OK TO TRANSFUSE    Crossmatch Result Compatible    Unit Number [id number]    Blood Component Type RED CELLS,LR    Unit division 00    Status of Unit ALLOCATED    Transfusion Status OK TO TRANSFUSE    Crossmatch Result Compatible   ABO/Rh     Status: None   Collection Time: 06/04/14 12:41 PM  Result Value Ref Range   ABO/RH(D) O POS   I-Stat arterial blood gas, ED     Status: Abnormal   Collection Time: 06/04/14 12:47 PM  Result Value Ref Range   pH, Arterial 7.280 (L) 7.350 - 7.450   pCO2 arterial 46.4 (H) 35.0 - 45.0 mmHg   pO2, Arterial 323.0 (H) 80.0 - 100.0 mmHg    Bicarbonate  21.6 20.0 - 24.0 mEq/L   TCO2 23 0 - 100 mmol/L   O2 Saturation 100.0 %   Acid-base deficit 5.0 (H) 0.0 - 2.0 mmol/L   Patient temperature 37.8 C    Collection site RADIAL, ALLEN'S TEST ACCEPTABLE    Drawn by RT    Sample type ARTERIAL   Hemoglobin and hematocrit, blood     Status: Abnormal   Collection Time: 06/04/14  1:54 PM  Result Value Ref Range   Hemoglobin 11.5 (L) 13.0 - 17.0 g/dL   HCT 34.7 (L) 39.0 - 52.0 %  CBC with Differential     Status: Abnormal   Collection Time: 06/04/14  2:00 PM  Result Value Ref Range   WBC 3.4 (L) 4.0 - 10.5 K/uL   RBC 3.76 (L) 4.22 - 5.81 MIL/uL   Hemoglobin 11.9 (L) 13.0 - 17.0 g/dL   HCT 36.2 (L) 39.0 - 52.0 %   MCV 96.3 78.0 - 100.0 fL   MCH 31.6 26.0 - 34.0 pg   MCHC 32.9 30.0 - 36.0 g/dL   RDW 13.3 11.5 - 15.5 %   Platelets 130 (L) 150 - 400 K/uL   Neutrophils Relative % 74 43 - 77 %   Neutro Abs 2.5 1.7 - 7.7 K/uL   Lymphocytes Relative 18 12 - 46 %   Lymphs Abs 0.6 (L) 0.7 - 4.0 K/uL   Monocytes Relative 8 3 - 12 %   Monocytes Absolute 0.3 0.1 - 1.0 K/uL   Eosinophils Relative 0 0 - 5 %   Eosinophils Absolute 0.0 0.0 - 0.7 K/uL   Basophils Relative 0 0 - 1 %   Basophils Absolute 0.0 0.0 - 0.1 K/uL  Comprehensive metabolic panel     Status: Abnormal   Collection Time: 06/04/14  2:00 PM  Result Value Ref Range   Sodium 141 135 - 145 mmol/L    Comment: Please note change in reference range.   Potassium 4.4 3.5 - 5.1 mmol/L    Comment: Please note change in reference range.   Chloride 103 96 - 112 mEq/L   CO2 23 19 - 32 mmol/L   Glucose, Bld 209 (H) 70 - 99 mg/dL   BUN 37 (H) 6 - 23 mg/dL   Creatinine, Ser 1.85 (H) 0.50 - 1.35 mg/dL   Calcium 8.5 8.4 - 10.5 mg/dL   Total Protein 4.5 (L) 6.0 - 8.3 g/dL   Albumin 2.6 (L) 3.5 - 5.2 g/dL   AST 33 0 - 37 U/L   ALT 12 0 - 53 U/L   Alkaline Phosphatase 41 39 - 117 U/L   Total Bilirubin 0.8 0.3 - 1.2 mg/dL   GFR calc non Af Amer 33 (L) >90 mL/min   GFR calc Af Amer 38  (L) >90 mL/min    Comment: (NOTE) The eGFR has been calculated using the CKD EPI equation. This calculation has not been validated in all clinical situations. eGFR's persistently <90 mL/min signify possible Chronic Kidney Disease.    Anion gap 15 5 - 15  APTT     Status: None   Collection Time: 06/04/14  2:00 PM  Result Value Ref Range   aPTT 30 24 - 37 seconds  Protime-INR     Status: Abnormal   Collection Time: 06/04/14  2:00 PM  Result Value Ref Range   Prothrombin Time 16.2 (H) 11.6 - 15.2 seconds   INR 1.29 0.00 - 1.49  Lactic acid, plasma     Status: Abnormal  Collection Time: 06/04/14  2:00 PM  Result Value Ref Range   Lactic Acid, Venous 6.4 (H) 0.5 - 2.2 mmol/L  Brain natriuretic peptide     Status: None   Collection Time: 06/04/14  2:00 PM  Result Value Ref Range   B Natriuretic Peptide 81.4 0.0 - 100.0 pg/mL    Comment: Please note change in reference range.  Magnesium     Status: Abnormal   Collection Time: 06/04/14  2:00 PM  Result Value Ref Range   Magnesium 1.1 (L) 1.5 - 2.5 mg/dL  Phosphorus     Status: None   Collection Time: 06/04/14  2:00 PM  Result Value Ref Range   Phosphorus 2.4 2.3 - 4.6 mg/dL  Ammonia     Status: None   Collection Time: 06/04/14  2:00 PM  Result Value Ref Range   Ammonia 12 11 - 32 umol/L    Comment: Please note change in reference range.  Procalcitonin - Baseline     Status: None   Collection Time: 06/04/14  2:00 PM  Result Value Ref Range   Procalcitonin 9.08 ng/mL    Comment:        Interpretation: PCT > 2 ng/mL: Systemic infection (sepsis) is likely, unless other causes are known. (NOTE)         ICU PCT Algorithm               Non ICU PCT Algorithm    ----------------------------     ------------------------------         PCT < 0.25 ng/mL                 PCT < 0.1 ng/mL     Stopping of antibiotics            Stopping of antibiotics       strongly encouraged.               strongly encouraged.     ----------------------------     ------------------------------       PCT level decrease by               PCT < 0.25 ng/mL       >= 80% from peak PCT       OR PCT 0.25 - 0.5 ng/mL          Stopping of antibiotics                                             encouraged.     Stopping of antibiotics           encouraged.    ----------------------------     ------------------------------       PCT level decrease by              PCT >= 0.25 ng/mL       < 80% from peak PCT        AND PCT >= 0.5 ng/mL            Continuing antibiotics                                               encouraged.       Continuing antibiotics  encouraged.    ----------------------------     ------------------------------     PCT level increase compared          PCT > 0.5 ng/mL         with peak PCT AND          PCT >= 0.5 ng/mL             Escalation of antibiotics                                          strongly encouraged.      Escalation of antibiotics        strongly encouraged.   I-Stat arterial blood gas, ED     Status: Abnormal   Collection Time: 06/04/14  2:51 PM  Result Value Ref Range   pH, Arterial 7.330 (L) 7.350 - 7.450   pCO2 arterial 39.1 35.0 - 45.0 mmHg   pO2, Arterial 60.0 (L) 80.0 - 100.0 mmHg   Bicarbonate 20.5 20.0 - 24.0 mEq/L   TCO2 22 0 - 100 mmol/L   O2 Saturation 88.0 %   Acid-base deficit 5.0 (H) 0.0 - 2.0 mmol/L   Patient temperature 37.5 C    Collection site RADIAL, ALLEN'S TEST ACCEPTABLE    Drawn by RT    Sample type ARTERIAL   Glucose, capillary     Status: Abnormal   Collection Time: 06/04/14  4:23 PM  Result Value Ref Range   Glucose-Capillary 164 (H) 70 - 99 mg/dL  Urinalysis, Routine w reflex microscopic     Status: Abnormal   Collection Time: 06/04/14  4:46 PM  Result Value Ref Range   Color, Urine YELLOW YELLOW   APPearance CLEAR CLEAR   Specific Gravity, Urine 1.021 1.005 - 1.030   pH 6.0 5.0 - 8.0   Glucose, UA 100 (A) NEGATIVE mg/dL   Hgb urine  dipstick TRACE (A) NEGATIVE   Bilirubin Urine NEGATIVE NEGATIVE   Ketones, ur 15 (A) NEGATIVE mg/dL   Protein, ur NEGATIVE NEGATIVE mg/dL   Urobilinogen, UA 1.0 0.0 - 1.0 mg/dL   Nitrite NEGATIVE NEGATIVE   Leukocytes, UA TRACE (A) NEGATIVE  Urine microscopic-add on     Status: Abnormal   Collection Time: 06/04/14  4:46 PM  Result Value Ref Range   Squamous Epithelial / LPF RARE RARE   WBC, UA 0-2 <3 WBC/hpf   RBC / HPF 0-2 <3 RBC/hpf   Bacteria, UA MANY (A) RARE    IMAGING: Ct Head Wo Contrast  06/04/2014   CLINICAL DATA:  Initial encounter for patient found unresponsive  EXAM: CT HEAD WITHOUT CONTRAST  TECHNIQUE: Contiguous axial images were obtained from the base of the skull through the vertex without intravenous contrast.  COMPARISON:  Outside MRI from 07/20/2009.  MRI from 05/04/2007.  FINDINGS: There is no evidence for acute hemorrhage, hydrocephalus, mass lesion, or abnormal extra-axial fluid collection. Diffuse loss of parenchymal volume is consistent with atrophy. No definite CT evidence for acute infarction. The visualized paranasal sinuses and mastoid air cells are clear.  IMPRESSION: Mild atrophy.  No acute intracranial abnormality.   Electronically Signed   By: Misty Stanley M.D.   On: 06/04/2014 16:03   Dg Chest Port 1 View  06/04/2014   CLINICAL DATA:  Acute respiratory failure. Upper GI bleed. New endotracheal tube and central line placement.  EXAM: PORTABLE CHEST - 1 VIEW 1:25  p.m.  COMPARISON:  06/04/2014 at 11:08 a.m.  FINDINGS: Endotracheal tube is in good position at the level of the thoracic inlet. New left jugular vein catheter tip is in the superior vena cava above the cavoatrial junction in good position. New NG tube tip is below the diaphragm.  The heart size and pulmonary vascularity are normal. Right lung is clear. Slight haziness in the left lung could represent unilateral pulmonary edema. Chronic bilateral apical pleural thickening.  IMPRESSION: Tubes and lines in  good position. Hazy infiltrate in the left lung has improved and probably represents unilateral pulmonary edema.   Electronically Signed   By: Rozetta Nunnery M.D.   On: 06/04/2014 15:13   Dg Chest Portable 1 View  06/04/2014   CLINICAL DATA:  Intubation.  EXAM: PORTABLE CHEST - 1 VIEW  COMPARISON:  02/16/2014.  FINDINGS: Endotracheal tube noted with its tip 1.3 cm above the carina. Diffuse left lung infiltrate noted consistent with pneumonia. Right lung is clear. Heart size is normal. No acute bony abnormality.  IMPRESSION: 1. Endotracheal tube noted with its tip 1.3 cm above the carina. 2. Diffuse left lung pulmonary infiltrate.   Electronically Signed   By: Marcello Moores  Register   On: 06/04/2014 11:32    HOSPITAL DIAGNOSES: Principal Problem:   UGIB (upper gastrointestinal bleed) Active Problems:   PVD (peripheral vascular disease)   CAD (coronary artery disease)   Acute respiratory failure   Tachycardia   Demand ischemia   IMPRESSION/RECOMMENDATION: 1. Tachycardia - this appears to be more likely a sinus tachycardia - response to acute GIB and sepsis picture. Would not actively try to reduce his HR, other than treating underlying causes. He may need further volume resuscitation.  2. PAD - ok to hold antiplatelet agents indefinitely at this point. His CIA stent is at low risk for thrombosis. 3. Elevated troponin - minimal troponin elevation, suspect demand ischemia or due to sepsis - would trend cardiac enzymes to look for typical rise and fall that could be consistent with ACS (given his CAD history).  Recommend echo at some point, when he is more stabilized and HR is slower. 4. Hypomagnesemia - magnesium of 1.1.  Would replete to >2.0.  Keep K+>4.0.  Cardiology will follow along with you. Thanks for consulting [place].  Time Spent Directly with Patient: 30 minutes  Pixie Casino, MD, [facility] Attending Cardiologist [facility]  Avrom Robarts C 06/04/2014, 6:58 PM

## 2014-06-04 NOTE — Progress Notes (Signed)
Pt. transported from Trauma-A to CT then to [id number], uneventful transport, RT to monitor.

## 2014-06-04 NOTE — H&P (Signed)
PULMONARY / CRITICAL CARE MEDICINE   Name: Marvin Leon MRN: [id number] DOB: 1933/05/08    ADMISSION DATE:  06/04/2014 CONSULTATION DATE:  06/04/2014  REFERRING MD :  EDP  CHIEF COMPLAINT:  VDRF and upper GI bleed  INITIAL PRESENTATION: 81 year old male with significant cardiac history who has been taking an aspirin a day with no NSAIDs or other anti-platelet agents who presents to the hospital with hematemesis and AMS.  In the ED the patient was noted to not be protecting his airway and was intubated for airway protection.  OGT was placed with 1500 ml of coffee ground black material was noted.  PCCM was called to admit to the ICU.  No recent etoh history.  STUDIES:  1/18 NGT lavaged with only coffee grounds, no active bleeding.  SIGNIFICANT EVENTS: 1/18 UGI bleed.  PAST MEDICAL HISTORY :   has a past medical history of Arthritis; GERD (gastroesophageal reflux disease); Hyperlipidemia; Hypertension; Coronary artery disease; Carotid artery disease; Peripheral arterial disease; Emphysema; Shortness of breath; Diabetes mellitus without complication; and Anxiety.  has past surgical history that includes Colonoscopy; Cardiac catheterization; Cholecystectomy (N/A, 04/07/2013); Iliac artery stent (Right, 02/22/2014); and Coronary angioplasty. Prior to Admission medications   Medication Sig Start Date End Date Taking? Authorizing Provider  ALPRAZolam Prudy Feeler(XANAX) 1 MG tablet Take 1 mg by mouth 2 (two) times daily.     Yes Historical Provider, MD  aspirin EC 81 MG tablet Take 81 mg by mouth every morning.     Yes Historical Provider, MD  cetirizine (ZYRTEC) 10 MG tablet Take 5 mg by mouth daily.   Yes Historical Provider, MD  cholecalciferol (VITAMIN D) 400 UNITS TABS tablet Take 400 Units by mouth 2 (two) times daily.    Yes Historical Provider, MD  clopidogrel (PLAVIX) 75 MG tablet Take 1 tablet (75 mg total) by mouth daily. 02/23/14  Yes Rhonda G Barrett, PA-C  dexlansoprazole (DEXILANT) 60 MG  capsule Take 60 mg by mouth daily before breakfast.    Yes Historical Provider, MD  docusate sodium (COLACE) 100 MG capsule Take 200 mg by mouth daily.    Yes Historical Provider, MD  ezetimibe-simvastatin (VYTORIN) 10-40 MG per tablet Take 1 tablet by mouth 3 (three) times a week. Mondays, Thursdays and Saturdays   Yes Historical Provider, MD  Ferrous Sulfate (IRON) 28 MG TABS Take 28 mg by mouth once a week. Wednesdays   Yes Historical Provider, MD  isosorbide mononitrate (IMDUR) 30 MG 24 hr tablet Take 15 mg by mouth at bedtime.     Yes Historical Provider, MD  metFORMIN (GLUCOPHAGE) 1000 MG tablet Take 1 tablet (1,000 mg total) by mouth 2 (two) times daily with a meal. HOLD for 2 days, restart on 02/25/2014 02/23/14  Yes Rhonda G Barrett, PA-C  metoCLOPramide (REGLAN) 10 MG tablet Take 5 mg by mouth daily before breakfast.   Yes Historical Provider, MD  metoprolol (TOPROL-XL) 50 MG 24 hr tablet Take 25 mg by mouth every morning.    Yes Historical Provider, MD  morphine (MSIR) 15 MG tablet Take 15 mg by mouth 3 (three) times daily as needed for severe pain.   Yes Historical Provider, MD  Multiple Vitamins-Minerals (MULTIVITAMINS THER. W/MINERALS) TABS Take 1 tablet by mouth daily.     Yes Historical Provider, MD  niacin (NIASPAN) 750 MG CR tablet Take 750-1,500 mg by mouth at bedtime. Alternates every 2 days - 1500mg  x 2 days, 750mg  x 2 days, 1500mg  x 2 days.....   Yes Historical  Provider, MD  PARoxetine (PAXIL) 20 MG tablet Take 20 mg by mouth every evening.     Yes Historical Provider, MD  pregabalin (LYRICA) 75 MG capsule Take 1 capsule (75 mg total) by mouth 2 (two) times daily. 12/05/13  Yes Ronal Fear, NP  Probiotic Product (ULTRAFLORA IMMUNE HEALTH PO) Take 1 tablet by mouth daily before breakfast.   Yes Historical Provider, MD  tamsulosin (FLOMAX) 0.4 MG CAPS Take 0.4 mg by mouth daily after supper.  10/11/12  Yes Historical Provider, MD   Allergies  Allergen Reactions  . Darvocet  [Propoxyphene N-Acetaminophen] Nausea And Vomiting  . Penicillins Nausea And Vomiting    FAMILY HISTORY:  indicated that his mother is deceased. He indicated that his father is deceased.  SOCIAL HISTORY:  reports that he quit smoking about 37 years ago. He has never used smokeless tobacco. He reports that he does not drink alcohol or use illicit drugs.  REVIEW OF SYSTEMS:  Unattainable, patient is sedated and intubated.  SUBJECTIVE:   VITAL SIGNS: Temp:  [99.9 F (37.7 C)-102.4 F (39.1 C)] 99.9 F (37.7 C) (01/18 1300) Pulse Rate:  [53-155] 53 (01/18 1300) Resp:  [20-28] 20 (01/18 1300) BP: (110-164)/(58-78) 110/58 mmHg (01/18 1300) SpO2:  [64 %-98 %] 64 % (01/18 1300) FiO2 (%):  [100 %] 100 % (01/18 1120) Weight:  [73.701 kg (162 lb 7.7 oz)] 73.701 kg (162 lb 7.7 oz) (01/18 1131) HEMODYNAMICS:   VENTILATOR SETTINGS: Vent Mode:  [-] PRVC FiO2 (%):  [100 %] 100 % Set Rate:  [16 bmp] 16 bmp Vt Set:  [600 mL] 600 mL PEEP:  [5 cmH20] 5 cmH20 Plateau Pressure:  [20 cmH20] 20 cmH20 INTAKE / OUTPUT:  Intake/Output Summary (Last 24 hours) at 06/04/14 1326 Last data filed at 06/04/14 1247  Gross per 24 hour  Intake   3000 ml  Output   1200 ml  Net   1800 ml    PHYSICAL EXAMINATION: General:  Chronically ill appearing, sedated, intubated and paralyzed.  NAD. Neuro:  Sedated, intubated and paralyzed.  HEENT:  [place]/AT, blood around mouth, paralyzed. Cardiovascular:  RRR, tachy, Nl S1/S2, -M/R/G. Lungs:  Coarse upper airway sounds. Abdomen:  Soft, NT, ND and +BS. Musculoskeletal:  -edema and -tenderness. Skin:  Intact.  LABS:  CBC  Recent Labs Lab 06/04/14 1100 06/04/14 1149  WBC  --  4.3  HGB 15.0 13.0  HCT 44.0 39.2  PLT  --  139*   Coag's  Recent Labs Lab 06/04/14 1149  INR 1.28   BMET  Recent Labs Lab 06/04/14 1100 06/04/14 1149  NA 137 142  K 4.2 3.9  CL 104 103  CO2  --  19  BUN 39* 37*  CREATININE 1.40* 1.80*  GLUCOSE 197* 189*    Electrolytes  Recent Labs Lab 06/04/14 1149  CALCIUM 8.5   Sepsis Markers  Recent Labs Lab 06/04/14 1100 06/04/14 1150  LATICACIDVEN 6.66* 6.9*   ABG  Recent Labs Lab 06/04/14 1247  PHART 7.280*  PCO2ART 46.4*  PO2ART 323.0*   Liver Enzymes  Recent Labs Lab 06/04/14 1149  AST 32  ALT 12  ALKPHOS 44  BILITOT 1.1  ALBUMIN 2.7*   Cardiac Enzymes  Recent Labs Lab 06/04/14 1149  TROPONINI 0.03   Glucose  Recent Labs Lab 06/04/14 1053  GLUCAP 196*    Imaging No results found.   ASSESSMENT / PLAN:  PULMONARY OETT 1/18>>> A: VDRF due to AMS.  Cause unclear.  ?aspiration. P:   -  Full vent support. - See neuro section. - F/U ABG and CXR. - Adjust vent according. - See ID section.  CARDIOVASCULAR CVL L IJ TLC 1/18>>> A: Sinus tach, borderline BP. P:  - IVF NS 100 ml/hr.. - Follow CVP. - Hold home anti-HTN.  RENAL A:  Cr 1.8, BUN 37, intravascularly dry on U/S. P:   - Hydrate. - BMET in AM. - Correct electrolytes as indicated.  GASTROINTESTINAL A:  UGI bleed.  Sees Dr. Loreta Ave as outpatient. P:   - NPO. - Protonix drip. - GI consult called. - Hold anti-platelets. - SCD.  HEMATOLOGIC A:  No active issues.  On plavix for cardia disorders. P:  - Type and screen. - H&H q6 x4. - Transfuse if <7.  INFECTIOUS A:  ?aspiration per family. P:   BCx2 1/18>>> UC 1/18>>> Sputum 1/18>>> Abx: Unasyn 1/18>>>  ENDOCRINE A:  DM.   P:   - ISS. - Cortisol. - Stress dose steroids.  NEUROLOGIC A:  AMS, source unknown. P:   RASS goal: 0 - Precedex and fentanyl gtt. - Head CT.   FAMILY  - Updates: Family updated bedside 1/18.  The patient is critically ill with multiple organ systems failure and requires high complexity decision making for assessment and support, frequent evaluation and titration of therapies, application of advanced monitoring technologies and extensive interpretation of multiple databases.   Critical Care  Time devoted to patient care services described in this note is  35  Minutes. This time reflects time of care of this signee Dr Koren Bound. This critical care time does not reflect procedure time, or teaching time or supervisory time of PA/NP/Med student/Med Resident etc but could involve care discussion time.  Alyson Reedy, M.D. [facility] Pulmonary/Critical Care Medicine. Pager: [phone]. After hours pager: [phone].   06/04/2014, 1:26 PM

## 2014-06-04 NOTE — Code Documentation (Signed)
Dr zavitz at bedside 

## 2014-06-04 NOTE — Consult Note (Addendum)
Reason for Consult: Upper GI bleed Referring Physician: CCM  Marvin Leon is an 81 y.o. male.  HPI: 81 year old white male, with multiple medical problems listed below, was in the [place] till 2 days ago when his daughter called EMS as he had a rapid heart rate. Reportedly he was feeling lethargic and seemed somewhat confused and disoriented but he refused to come to the hospital . This morning when his wife woke up to check on him he was more confused and had vomited some coffee grounds on his bed. There was no fresh blood or clots in the emesis. The family was not sure how long he had been vomiting and his daugther then called EMS. He had been complaining of abdominal pain for a few days now saying his "hiatal hernia was acting up". He has been getting Morphine for back pain from Dr. Nelva Bush and had received an injection in his back on 05/15/14. He has also been on Plavix and Aspirin since 02/22/14 when a iliac artery stent was placed by Dr. Gwenlyn Found. There is no history of any NSAID use or abuse. He had a normal EGD/Colonoscopy in 2009. He had an adenomatous polyp removed in 1998. His got his last dose of Plavix and Aspirin yesterday.      Past Medical History  Diagnosis Date  . Arthritis   . GERD (gastroesophageal reflux disease)   . Hyperlipidemia   . Hypertension   . Coronary artery disease   . Carotid artery disease   . Peripheral arterial disease     50% ostial right common iliac artery stenosis without claudication  . Emphysema   . Shortness of breath   . Diabetes mellitus without complication     type 2  . Anxiety disorder    Past Surgical History  Procedure Laterality Date  . Colonoscopy    . Cardiac catheterization      3 stents placed  . Cholecystectomy N/A 04/07/2013    Procedure: LAPAROSCOPIC CHOLECYSTECTOMY WITH INTRAOPERATIVE CHOLANGIOGRAM;  Surgeon: Merrie Roof, MD;  Location: [facility];  Service: General;  Laterality: N/A;  . Iliac artery stent Right 02/22/2014    Dr. Gwenlyn Found  7 mm x 22 mm  ICast covered stent  . Coronary angioplasty      all 3 heart vessels    Family History  Problem Relation Age of Onset  . Heart disease Mother   . Heart disease Father    Social History:  reports that he quit smoking about 37 years ago. He has never used smokeless tobacco. He reports that he does not drink alcohol or use illicit drugs.  Allergies:  Allergies  Allergen Reactions  . Darvocet [Propoxyphene N-Acetaminophen] Nausea And Vomiting  . Penicillins Nausea And Vomiting   Medications: I have reviewed the patient's current medications.  Results for orders placed or performed during the hospital encounter of 06/04/14 (from the past 48 hour(s))  CBG monitoring, ED     Status: Abnormal   Collection Time: 06/04/14 10:53 AM  Result Value Ref Range   Glucose-Capillary 196 (H) 70 - 99 mg/dL  I-Stat Chem 8, ED     Status: Abnormal   Collection Time: 06/04/14 11:00 AM  Result Value Ref Range   Sodium 137 135 - 145 mmol/L   Potassium 4.2 3.5 - 5.1 mmol/L   Chloride 104 96 - 112 mEq/L   BUN 39 (H) 6 - 23 mg/dL   Creatinine, Ser 1.40 (H) 0.50 - 1.35 mg/dL   Glucose,  Bld 197 (H) 70 - 99 mg/dL   Calcium, Ion 0.91 (L) 1.13 - 1.30 mmol/L   TCO2 16 0 - 100 mmol/L   Hemoglobin 15.0 13.0 - 17.0 g/dL   HCT 44.0 39.0 - 52.0 %  I-Stat CG4 Lactic Acid, ED     Status: Abnormal   Collection Time: 06/04/14 11:00 AM  Result Value Ref Range   Lactic Acid, Venous 6.66 (H) 0.5 - 2.2 mmol/L  POC occult blood, ED     Status: None   Collection Time: 06/04/14 11:09 AM  Result Value Ref Range   Fecal Occult Bld NEGATIVE NEGATIVE  CBC with Differential     Status: Abnormal   Collection Time: 06/04/14 11:49 AM  Result Value Ref Range   WBC 4.3 4.0 - 10.5 K/uL   RBC 4.04 (L) 4.22 - 5.81 MIL/uL   Hemoglobin 13.0 13.0 - 17.0 g/dL   HCT 39.2 39.0 - 52.0 %   MCV 97.0 78.0 - 100.0 fL   MCH 32.2 26.0 - 34.0 pg   MCHC 33.2 30.0 - 36.0 g/dL   RDW 13.3 11.5 - 15.5 %   Platelets 139 (L) 150 -  400 K/uL   Neutrophils Relative % 79 (H) 43 - 77 %   Neutro Abs 3.3 1.7 - 7.7 K/uL   Lymphocytes Relative 12 12 - 46 %   Lymphs Abs 0.5 (L) 0.7 - 4.0 K/uL   Monocytes Relative 9 3 - 12 %   Monocytes Absolute 0.4 0.1 - 1.0 K/uL   Eosinophils Relative 0 0 - 5 %   Eosinophils Absolute 0.0 0.0 - 0.7 K/uL   Basophils Relative 0 0 - 1 %   Basophils Absolute 0.0 0.0 - 0.1 K/uL  Comprehensive metabolic panel     Status: Abnormal   Collection Time: 06/04/14 11:49 AM  Result Value Ref Range   Sodium 142 135 - 145 mmol/L    Comment: Please note change in reference range.   Potassium 3.9 3.5 - 5.1 mmol/L    Comment: Please note change in reference range.   Chloride 103 96 - 112 mEq/L   CO2 19 19 - 32 mmol/L   Glucose, Bld 189 (H) 70 - 99 mg/dL   BUN 37 (H) 6 - 23 mg/dL   Creatinine, Ser 1.80 (H) 0.50 - 1.35 mg/dL   Calcium 8.5 8.4 - 10.5 mg/dL   Total Protein 4.5 (L) 6.0 - 8.3 g/dL   Albumin 2.7 (L) 3.5 - 5.2 g/dL   AST 32 0 - 37 U/L   ALT 12 0 - 53 U/L   Alkaline Phosphatase 44 39 - 117 U/L   Total Bilirubin 1.1 0.3 - 1.2 mg/dL   GFR calc non Af Amer 34 (L) >90 mL/min   GFR calc Af Amer 40 (L) >90 mL/min    Comment: (NOTE) The eGFR has been calculated using the CKD EPI equation. This calculation has not been validated in all clinical situations. eGFR's persistently <90 mL/min signify possible Chronic Kidney Disease.    Anion gap 20 (H) 5 - 15  Protime-INR     Status: Abnormal   Collection Time: 06/04/14 11:49 AM  Result Value Ref Range   Prothrombin Time 16.1 (H) 11.6 - 15.2 seconds   INR 1.28 0.00 - 1.49  Troponin I     Status: None   Collection Time: 06/04/14 11:49 AM  Result Value Ref Range   Troponin I 0.03 <0.031 ng/mL    Comment:  NO INDICATION OF MYOCARDIAL INJURY. Please note change in reference range.   Lactic acid, plasma     Status: Abnormal   Collection Time: 06/04/14 11:50 AM  Result Value Ref Range   Lactic Acid, Venous 6.9 (H) 0.5 - 2.2 mmol/L   Urinalysis, Routine w reflex microscopic     Status: Abnormal   Collection Time: 06/04/14 12:29 PM  Result Value Ref Range   Color, Urine YELLOW YELLOW   APPearance HAZY (A) CLEAR   Specific Gravity, Urine 1.023 1.005 - 1.030   pH 6.5 5.0 - 8.0   Glucose, UA NEGATIVE NEGATIVE mg/dL   Hgb urine dipstick NEGATIVE NEGATIVE   Bilirubin Urine NEGATIVE NEGATIVE   Ketones, ur 40 (A) NEGATIVE mg/dL   Protein, ur NEGATIVE NEGATIVE mg/dL   Urobilinogen, UA 1.0 0.0 - 1.0 mg/dL   Nitrite NEGATIVE NEGATIVE   Leukocytes, UA SMALL (A) NEGATIVE  Urine microscopic-add on     Status: Abnormal   Collection Time: 06/04/14 12:29 PM  Result Value Ref Range   Squamous Epithelial / LPF FEW (A) RARE   WBC, UA 7-10 <3 WBC/hpf   RBC / HPF 0-2 <3 RBC/hpf   Bacteria, UA MANY (A) RARE  Prepare RBC     Status: None   Collection Time: 06/04/14 12:41 PM  Result Value Ref Range   Order Confirmation ORDER PROCESSED BY BLOOD BANK   Type and screen for Red Blood Exchange     Status: None (Preliminary result)   Collection Time: 06/04/14 12:41 PM  Result Value Ref Range   ABO/RH(D) O POS    Antibody Screen NEG    Sample Expiration 06/07/2014    Unit Number [id number]    Blood Component Type RED CELLS,LR    Unit division 00    Status of Unit ALLOCATED    Transfusion Status OK TO TRANSFUSE    Crossmatch Result Compatible    Unit Number [id number]    Blood Component Type RED CELLS,LR    Unit division 00    Status of Unit ALLOCATED    Transfusion Status OK TO TRANSFUSE    Crossmatch Result Compatible   ABO/Rh     Status: None   Collection Time: 06/04/14 12:41 PM  Result Value Ref Range   ABO/RH(D) O POS   I-Stat arterial blood gas, ED     Status: Abnormal   Collection Time: 06/04/14 12:47 PM  Result Value Ref Range   pH, Arterial 7.280 (L) 7.350 - 7.450   pCO2 arterial 46.4 (H) 35.0 - 45.0 mmHg   pO2, Arterial 323.0 (H) 80.0 - 100.0 mmHg   Bicarbonate 21.6 20.0 - 24.0 mEq/L   TCO2 23 0 - 100  mmol/L   O2 Saturation 100.0 %   Acid-base deficit 5.0 (H) 0.0 - 2.0 mmol/L   Patient temperature 37.8 C    Collection site RADIAL, ALLEN'S TEST ACCEPTABLE    Drawn by RT    Sample type ARTERIAL   Hemoglobin and hematocrit, blood     Status: Abnormal   Collection Time: 06/04/14  1:54 PM  Result Value Ref Range   Hemoglobin 11.5 (L) 13.0 - 17.0 g/dL   HCT 34.7 (L) 39.0 - 52.0 %  CBC with Differential     Status: Abnormal   Collection Time: 06/04/14  2:00 PM  Result Value Ref Range   WBC 3.4 (L) 4.0 - 10.5 K/uL   RBC 3.76 (L) 4.22 - 5.81 MIL/uL   Hemoglobin 11.9 (L) 13.0 - 17.0 g/dL  HCT 36.2 (L) 39.0 - 52.0 %   MCV 96.3 78.0 - 100.0 fL   MCH 31.6 26.0 - 34.0 pg   MCHC 32.9 30.0 - 36.0 g/dL   RDW 13.3 11.5 - 15.5 %   Platelets 130 (L) 150 - 400 K/uL   Neutrophils Relative % 74 43 - 77 %   Neutro Abs 2.5 1.7 - 7.7 K/uL   Lymphocytes Relative 18 12 - 46 %   Lymphs Abs 0.6 (L) 0.7 - 4.0 K/uL   Monocytes Relative 8 3 - 12 %   Monocytes Absolute 0.3 0.1 - 1.0 K/uL   Eosinophils Relative 0 0 - 5 %   Eosinophils Absolute 0.0 0.0 - 0.7 K/uL   Basophils Relative 0 0 - 1 %   Basophils Absolute 0.0 0.0 - 0.1 K/uL  Comprehensive metabolic panel     Status: Abnormal   Collection Time: 06/04/14  2:00 PM  Result Value Ref Range   Sodium 141 135 - 145 mmol/L    Comment: Please note change in reference range.   Potassium 4.4 3.5 - 5.1 mmol/L    Comment: Please note change in reference range.   Chloride 103 96 - 112 mEq/L   CO2 23 19 - 32 mmol/L   Glucose, Bld 209 (H) 70 - 99 mg/dL   BUN 37 (H) 6 - 23 mg/dL   Creatinine, Ser 1.85 (H) 0.50 - 1.35 mg/dL   Calcium 8.5 8.4 - 10.5 mg/dL   Total Protein 4.5 (L) 6.0 - 8.3 g/dL   Albumin 2.6 (L) 3.5 - 5.2 g/dL   AST 33 0 - 37 U/L   ALT 12 0 - 53 U/L   Alkaline Phosphatase 41 39 - 117 U/L   Total Bilirubin 0.8 0.3 - 1.2 mg/dL   GFR calc non Af Amer 33 (L) >90 mL/min   GFR calc Af Amer 38 (L) >90 mL/min    Comment: (NOTE) The eGFR has been  calculated using the CKD EPI equation. This calculation has not been validated in all clinical situations. eGFR's persistently <90 mL/min signify possible Chronic Kidney Disease.    Anion gap 15 5 - 15  APTT     Status: None   Collection Time: 06/04/14  2:00 PM  Result Value Ref Range   aPTT 30 24 - 37 seconds  Protime-INR     Status: Abnormal   Collection Time: 06/04/14  2:00 PM  Result Value Ref Range   Prothrombin Time 16.2 (H) 11.6 - 15.2 seconds   INR 1.29 0.00 - 1.49  Lactic acid, plasma     Status: Abnormal   Collection Time: 06/04/14  2:00 PM  Result Value Ref Range   Lactic Acid, Venous 6.4 (H) 0.5 - 2.2 mmol/L  Brain natriuretic peptide     Status: None   Collection Time: 06/04/14  2:00 PM  Result Value Ref Range   B Natriuretic Peptide 81.4 0.0 - 100.0 pg/mL    Comment: Please note change in reference range.  Magnesium     Status: Abnormal   Collection Time: 06/04/14  2:00 PM  Result Value Ref Range   Magnesium 1.1 (L) 1.5 - 2.5 mg/dL  Phosphorus     Status: None   Collection Time: 06/04/14  2:00 PM  Result Value Ref Range   Phosphorus 2.4 2.3 - 4.6 mg/dL  Ammonia     Status: None   Collection Time: 06/04/14  2:00 PM  Result Value Ref Range   Ammonia 12 11 -  32 umol/L    Comment: Please note change in reference range.  Procalcitonin - Baseline     Status: None   Collection Time: 06/04/14  2:00 PM  Result Value Ref Range   Procalcitonin 9.08 ng/mL    Comment:        Interpretation: PCT > 2 ng/mL: Systemic infection (sepsis) is likely, unless other causes are known. (NOTE)         ICU PCT Algorithm               Non ICU PCT Algorithm    ----------------------------     ------------------------------         PCT < 0.25 ng/mL                 PCT < 0.1 ng/mL     Stopping of antibiotics            Stopping of antibiotics       strongly encouraged.               strongly encouraged.    ----------------------------     ------------------------------       PCT  level decrease by               PCT < 0.25 ng/mL       >= 80% from peak PCT       OR PCT 0.25 - 0.5 ng/mL          Stopping of antibiotics                                             encouraged.     Stopping of antibiotics           encouraged.    ----------------------------     ------------------------------       PCT level decrease by              PCT >= 0.25 ng/mL       < 80% from peak PCT        AND PCT >= 0.5 ng/mL            Continuing antibiotics                                               encouraged.       Continuing antibiotics            encouraged.    ----------------------------     ------------------------------     PCT level increase compared          PCT > 0.5 ng/mL         with peak PCT AND          PCT >= 0.5 ng/mL             Escalation of antibiotics                                          strongly encouraged.      Escalation of antibiotics        strongly encouraged.   I-Stat arterial blood gas, ED     Status: Abnormal  Collection Time: 06/04/14  2:51 PM  Result Value Ref Range   pH, Arterial 7.330 (L) 7.350 - 7.450   pCO2 arterial 39.1 35.0 - 45.0 mmHg   pO2, Arterial 60.0 (L) 80.0 - 100.0 mmHg   Bicarbonate 20.5 20.0 - 24.0 mEq/L   TCO2 22 0 - 100 mmol/L   O2 Saturation 88.0 %   Acid-base deficit 5.0 (H) 0.0 - 2.0 mmol/L   Patient temperature 37.5 C    Collection site RADIAL, ALLEN'S TEST ACCEPTABLE    Drawn by RT    Sample type ARTERIAL   Glucose, capillary     Status: Abnormal   Collection Time: 06/04/14  4:23 PM  Result Value Ref Range   Glucose-Capillary 164 (H) 70 - 99 mg/dL   Ct Head Wo Contrast  06/04/2014   CLINICAL DATA:  Initial encounter for patient found unresponsive  EXAM: CT HEAD WITHOUT CONTRAST  TECHNIQUE: Contiguous axial images were obtained from the base of the skull through the vertex without intravenous contrast.  COMPARISON:  Outside MRI from 07/20/2009.  MRI from 05/04/2007.  FINDINGS: There is no evidence for acute  hemorrhage, hydrocephalus, mass lesion, or abnormal extra-axial fluid collection. Diffuse loss of parenchymal volume is consistent with atrophy. No definite CT evidence for acute infarction. The visualized paranasal sinuses and mastoid air cells are clear.  IMPRESSION: Mild atrophy.  No acute intracranial abnormality.   Electronically Signed   By: Misty Stanley M.D.   On: 06/04/2014 16:03   Dg Chest Port 1 View  06/04/2014   CLINICAL DATA:  Acute respiratory failure. Upper GI bleed. New endotracheal tube and central line placement.  EXAM: PORTABLE CHEST - 1 VIEW 1:25 p.m.  COMPARISON:  06/04/2014 at 11:08 a.m.  FINDINGS: Endotracheal tube is in good position at the level of the thoracic inlet. New left jugular vein catheter tip is in the superior vena cava above the cavoatrial junction in good position. New NG tube tip is below the diaphragm.  The heart size and pulmonary vascularity are normal. Right lung is clear. Slight haziness in the left lung could represent unilateral pulmonary edema. Chronic bilateral apical pleural thickening.  IMPRESSION: Tubes and lines in good position. Hazy infiltrate in the left lung has improved and probably represents unilateral pulmonary edema.   Electronically Signed   By: Rozetta Nunnery M.D.   On: 06/04/2014 15:13   Dg Chest Portable 1 View  06/04/2014   CLINICAL DATA:  Intubation.  EXAM: PORTABLE CHEST - 1 VIEW  COMPARISON:  02/16/2014.  FINDINGS: Endotracheal tube noted with its tip 1.3 cm above the carina. Diffuse left lung infiltrate noted consistent with pneumonia. Right lung is clear. Heart size is normal. No acute bony abnormality.  IMPRESSION: 1. Endotracheal tube noted with its tip 1.3 cm above the carina. 2. Diffuse left lung pulmonary infiltrate.   Electronically Signed   By: Marcello Moores  Register   On: 06/04/2014 11:32   Review of Systems  Unable to perform ROS  Blood pressure 109/68, pulse 153, temperature 101.6 F (38.7 C), temperature source Rectal, resp. rate  15, weight 74 kg (163 lb 2.3 oz), SpO2 95 %. Physical Exam  Constitutional: He has a sickly appearance. He appears ill. He is intubated.  HENT:  Head: Normocephalic and atraumatic.  Eyes: Lids are normal.  Neck: Trachea normal and normal range of motion. Neck supple.  Cardiovascular: Tachycardia present.   Respiratory: He is intubated.  GI:  Abdomen is soft and slightly distended with NO bowel sounds  Skin: Skin is warm and dry.   Assessment/Plan: 1) Coffee ground emesis with fever:?ulcer ?perforation-GERD/Hiatal hernia-On PPI drip. Plans are to hold of on an EGD tonight. Will re-evaluate tomorrow.   2) AODM/Diabetic gastroparesis.  3) Acute respiratory failure with hypoxia; .   4) Tachycardia-thought to be due to acute UGI vs sepsis/CAD/PVD/HTN. 5) Back pain with leg pain /claudication-under the care of Dr. Nelva Bush and Dr. Gwenlyn Found.   Lazarus Sudbury 06/04/2014, 5:27 PM

## 2014-06-04 NOTE — ED Notes (Signed)
REPORT givento 2100. Pt going to ct first before going to room.

## 2014-06-04 NOTE — Progress Notes (Signed)
   06/04/14 1500  Clinical Encounter Type  Visited With Patient;Family;Patient and family together;Health care provider  Visit Type Initial;Spiritual support;Social support;Critical Care;ED  Spiritual Encounters  Spiritual Needs Prayer  Stress Factors  Family Stress Factors Health changes;Lack of knowledge   Chaplain was in the ED and interacted with patient's family. Chaplain was primarily introduced to patient's daughter. Chaplain was notified by medical staff that the patient would be transferred from the ED to 35M ICU. Chaplain helped one of the patient's daughters find the 35M waiting area. Patient's daughter asked the chaplain to come back at a later time to speak with the whole family. Chaplain returned to the whole family roughly two hours later. Chaplain explained that the patient was finishing up some testing and then would be transferred to the ICU.  Patient's family explained that the patient started to become noticeably ill last night and they called an ambulance today. Family is currently really worried and anxious. Patient's daughter asked for prayer. Chaplain prayed with the patient's family. Chaplain will continue to provide emotional and spiritual support for patient and patient's family as needed. Cranston NeighborStrother, Zaim Nitta R, Chaplain  3:25 PM

## 2014-06-04 NOTE — ED Notes (Signed)
Per EMS- pt was 79% on room air. 89% on NRB. Pt was from home with noted coffee ground emesis. Pt reported to have Rhonchi in all fields.  Pt was CBG 266. Pt received NS. Pt was found unresponsive upon EMS arrival . Pt has family on the way.

## 2014-06-04 NOTE — ED Notes (Signed)
Lactic acid results given to Dr. Jodi MourningZavitz and primary informed.

## 2014-06-04 NOTE — Progress Notes (Signed)
Chaplain responded to page from ED.  Chaplain encountered pt's family and MD in hallway outside room.  Md was updating family on pt's condition.  Upon introduction, family responding with appropriate emotions regarding pt's condition.  Pt's family spent time at bedside then moved back to consult room while RN's performed procedures.  Chaplain provided emotional and spiritual support as well as ministry of presence and empathetic listening.  Chaplain will follow up as needed.    06/04/14 1100  Clinical Encounter Type  Visited With Patient and family together;Health care provider  Visit Type Initial;Critical Care;ED  Referral From Care management  Spiritual Encounters  Spiritual Needs Emotional;Grief support  Stress Factors  Patient Stress Factors Exhausted;Health changes  Family Stress Factors Family relationships;Health changes   Blain PaisOvercash, Tequlia Gonsalves A, Chaplain 06/04/2014 11:50 AM

## 2014-06-04 NOTE — Progress Notes (Signed)
Call from DR Virgina OrganQureshi resident on call  - currently not actively bleeding but febrile, hypotensive - persistent lactic acidosis and high PCT with low WBC  - sync with vent   Recent Labs Lab 06/04/14 1100 06/04/14 1150 06/04/14 1400  LATICACIDVEN 6.66* 6.9* 6.4*  PROCALCITON  --   --  9.08    Recent Labs Lab 06/04/14 1149 06/04/14 1354 06/04/14 1400 06/04/14 1925  HGB 13.0 11.5* 11.9* 10.1*  HCT 39.2 34.7* 36.2* 29.7*  WBC 4.3  --  3.4*  --   PLT 139*  --  130*  --      A Septic shock more likely at this poinit LEss likely currently in hgic shock  P Anti-infectives    Start     Dose/Rate Route Frequency Ordered Stop   06/04/14 2300  metroNIDAZOLE (FLAGYL) IVPB 500 mg     500 mg100 mL/hr over 60 Minutes Intravenous Every 8 hours 06/04/14 1524     06/04/14 1500  ceFEPIme (MAXIPIME) 1 g in dextrose 5 % 50 mL IVPB     1 g100 mL/hr over 30 Minutes Intravenous Every 24 hours 06/04/14 1454     06/04/14 1130  vancomycin (VANCOCIN) 1,500 mg in sodium chloride 0.9 % 500 mL IVPB     1,500 mg250 mL/hr over 120 Minutes Intravenous  Once 06/04/14 1124 06/04/14 1448   06/04/14 1130  ceFEPIme (MAXIPIME) 1 g in dextrose 5 % 50 mL IVPB  Status:  Discontinued     1 g100 mL/hr over 30 Minutes Intravenous  Once 06/04/14 1124 06/04/14 1546   06/04/14 1130  metroNIDAZOLE (FLAGYL) IVPB 500 mg     500 mg100 mL/hr over 60 Minutes Intravenous  Once 06/04/14 1124 06/04/14 1617      - start EGDT septic shock protocol - phase 2 - consider SSAIL septic shock study in AM - check KUB, Amylase, Lipase, CBC, BMET, LActate,   Resident will order  Dr. Kalman ShanMurali Chris Cripps, M.D., [facility] Hospital[facility]C.P Pulmonary and Critical Care Medicine Staff Physician [facility] Pager: [phone], If no answer or between  15:00h - 7:00h: call [phone]  06/04/2014 11:12 PM

## 2014-06-04 NOTE — Progress Notes (Addendum)
ANTIBIOTIC CONSULT NOTE - INITIAL  Pharmacy Consult for cefepime Indication: rule out pneumonia  Allergies  Allergen Reactions  . Darvocet [Propoxyphene N-Acetaminophen] Nausea And Vomiting  . Penicillins Nausea And Vomiting    Patient Measurements: Weight: 162 lb 7.7 oz (73.701 kg) Adjusted Body Weight:   Vital Signs: Temp: 99.5 F (37.5 C) (01/18 1400) Temp Source: Rectal (01/18 1105) BP: 117/72 mmHg (01/18 1400) Pulse Rate: 53 (01/18 1300) Intake/Output from previous day:   Intake/Output from this shift: Total I/O In: 3000 [I.V.:3000] Out: 1200 [Emesis/NG output:1200]  Labs:  Recent Labs  06/04/14 1100 06/04/14 1149 06/04/14 1400  WBC  --  4.3 3.4*  HGB 15.0 13.0 11.9*  PLT  --  139* 130*  CREATININE 1.40* 1.80*  --    Estimated Creatinine Clearance: 33.3 mL/min (by C-G formula based on Cr of 1.8). No results for input(s): VANCOTROUGH, VANCOPEAK, VANCORANDOM, GENTTROUGH, GENTPEAK, GENTRANDOM, TOBRATROUGH, TOBRAPEAK, TOBRARND, AMIKACINPEAK, AMIKACINTROU, AMIKACIN in the last 72 hours.   Microbiology: No results found for this or any previous visit (from the past 720 hour(s)).  Medical History: Past Medical History  Diagnosis Date  . Arthritis   . GERD (gastroesophageal reflux disease)   . Hyperlipidemia   . Hypertension   . Coronary artery disease   . Carotid artery disease   . Peripheral arterial disease     50% ostial right common iliac artery stenosis without claudication  . Emphysema   . Shortness of breath   . Diabetes mellitus without complication     type 2  . Anxiety     Medications:  Anti-infectives    Start     Dose/Rate Route Frequency Ordered Stop   06/04/14 1500  ceFEPIme (MAXIPIME) 1 g in dextrose 5 % 50 mL IVPB     1 g100 mL/hr over 30 Minutes Intravenous Every 24 hours 06/04/14 1454     06/04/14 1130  vancomycin (VANCOCIN) 1,500 mg in sodium chloride 0.9 % 500 mL IVPB     1,500 mg250 mL/hr over 120 Minutes Intravenous  Once  06/04/14 1124 06/04/14 1448   06/04/14 1130  ceFEPIme (MAXIPIME) 1 g in dextrose 5 % 50 mL IVPB  Status:  Discontinued     1 g100 mL/hr over 30 Minutes Intravenous  Once 06/04/14 1124 06/04/14 1453   06/04/14 1130  metroNIDAZOLE (FLAGYL) IVPB 500 mg     500 mg100 mL/hr over 60 Minutes Intravenous  Once 06/04/14 1124       Assessment: 81 yom presented to the ED with upper GIB. To start empiric cefepime for PNA. Also ordered a 1x dose of flagyl.  Cefepime 1/18>> Flagyl x 1 1/18  Goal of Therapy:  Eradication of infection  Plan:  1. Cefepime 1gm IV Q24H 2. F/u renal fxn, C&S, clinical status 3. Continue anaerobic coverage?  Rumbarger, Drake Leachachel Lynn 06/04/2014,2:56 PM   Addendum: Adding IV metronidazole 500 mg Q 8 hours for continued anaerobic coverage per CCM.   Vinnie LevelBenjamin Nicholas Ossa, PharmD., BCPS Clinical Pharmacist Pager [phone]

## 2014-06-05 ENCOUNTER — Encounter (HOSPITAL_COMMUNITY)
Admission: EM | Disposition: A | Discharge status: Hospice | Payer: Self-pay | Source: Home / Self Care | Attending: Pulmonary Disease

## 2014-06-05 ENCOUNTER — Encounter (HOSPITAL_COMMUNITY): Payer: Self-pay | Admitting: *Deleted

## 2014-06-05 DIAGNOSIS — N179 Acute kidney failure, unspecified: Secondary | ICD-10-CM

## 2014-06-05 DIAGNOSIS — K922 Gastrointestinal hemorrhage, unspecified: Secondary | ICD-10-CM | POA: Diagnosis present

## 2014-06-05 HISTORY — PX: ESOPHAGOGASTRODUODENOSCOPY: SHX5428

## 2014-06-05 LAB — BLOOD GAS, ARTERIAL
Acid-base deficit: 4.9 mmol/L — ABNORMAL HIGH (ref 0.0–2.0)
BICARBONATE: 19.3 meq/L — AB (ref 20.0–24.0)
Drawn by: 252031
FIO2: 0.4 %
LHR: 16 {breaths}/min
O2 SAT: 98.5 %
PEEP/CPAP: 5 cmH2O
PH ART: 7.381 (ref 7.350–7.450)
Patient temperature: 98.6
TCO2: 20.3 mmol/L (ref 0–100)
VT: 600 mL
pCO2 arterial: 33.4 mmHg — ABNORMAL LOW (ref 35.0–45.0)
pO2, Arterial: 127 mmHg — ABNORMAL HIGH (ref 80.0–100.0)

## 2014-06-05 LAB — BASIC METABOLIC PANEL
ANION GAP: 7 (ref 5–15)
ANION GAP: 2 — AB (ref 5–15)
Anion gap: 4 — ABNORMAL LOW (ref 5–15)
BUN: 32 mg/dL — ABNORMAL HIGH (ref 6–23)
BUN: 27 mg/dL — AB (ref 6–23)
BUN: 32 mg/dL — AB (ref 6–23)
CALCIUM: 8.1 mg/dL — AB (ref 8.4–10.5)
CHLORIDE: 115 meq/L — AB (ref 96–112)
CO2: 20 mmol/L (ref 19–32)
CO2: 22 mmol/L (ref 19–32)
CO2: 26 mmol/L (ref 19–32)
CREATININE: 1.48 mg/dL — AB (ref 0.50–1.35)
CREATININE: 1.43 mg/dL — AB (ref 0.50–1.35)
Calcium: 7.4 mg/dL — ABNORMAL LOW (ref 8.4–10.5)
Calcium: 7.8 mg/dL — ABNORMAL LOW (ref 8.4–10.5)
Chloride: 114 mEq/L — ABNORMAL HIGH (ref 96–112)
Chloride: 115 mEq/L — ABNORMAL HIGH (ref 96–112)
Creatinine, Ser: 1.48 mg/dL — ABNORMAL HIGH (ref 0.50–1.35)
GFR calc Af Amer: 50 mL/min — ABNORMAL LOW (ref >90)
GFR calc Af Amer: 50 mL/min — ABNORMAL LOW (ref >90)
GFR calc Af Amer: 52 mL/min — ABNORMAL LOW (ref >90)
GFR calc non Af Amer: 43 mL/min — ABNORMAL LOW (ref >90)
GFR calc non Af Amer: 45 mL/min — ABNORMAL LOW (ref >90)
GFR, EST NON AFRICAN AMERICAN: 43 mL/min — AB (ref >90)
GLUCOSE: 106 mg/dL — AB (ref 70–99)
Glucose, Bld: 132 mg/dL — ABNORMAL HIGH (ref 70–99)
Glucose, Bld: 161 mg/dL — ABNORMAL HIGH (ref 70–99)
Potassium: 3.1 mmol/L — ABNORMAL LOW (ref 3.5–5.1)
Potassium: 3.7 mmol/L (ref 3.5–5.1)
Potassium: 3.7 mmol/L (ref 3.5–5.1)
Sodium: 141 mmol/L (ref 135–145)
Sodium: 141 mmol/L (ref 135–145)
Sodium: 143 mmol/L (ref 135–145)

## 2014-06-05 LAB — CBC
HCT: 24.2 % — ABNORMAL LOW (ref 39.0–52.0)
HCT: 26.3 % — ABNORMAL LOW (ref 39.0–52.0)
HEMOGLOBIN: 8.2 g/dL — AB (ref 13.0–17.0)
Hemoglobin: 9 g/dL — ABNORMAL LOW (ref 13.0–17.0)
MCH: 31.3 pg (ref 26.0–34.0)
MCH: 31.5 pg (ref 26.0–34.0)
MCHC: 34.2 g/dL (ref 30.0–36.0)
MCHC: 33.9 g/dL (ref 30.0–36.0)
MCV: 92 fL (ref 78.0–100.0)
MCV: 92.4 fL (ref 78.0–100.0)
PLATELETS: 101 10*3/uL — AB (ref 150–400)
Platelets: 85 10*3/uL — ABNORMAL LOW (ref 150–400)
RBC: 2.62 MIL/uL — ABNORMAL LOW (ref 4.22–5.81)
RBC: 2.86 MIL/uL — ABNORMAL LOW (ref 4.22–5.81)
RDW: 13.5 % (ref 11.5–15.5)
RDW: 13.6 % (ref 11.5–15.5)
WBC: 2.3 10*3/uL — ABNORMAL LOW (ref 4.0–10.5)
WBC: 6.3 10*3/uL (ref 4.0–10.5)

## 2014-06-05 LAB — GLUCOSE, CAPILLARY
GLUCOSE-CAPILLARY: 116 mg/dL — AB (ref 70–99)
GLUCOSE-CAPILLARY: 147 mg/dL — AB (ref 70–99)
Glucose-Capillary: 141 mg/dL — ABNORMAL HIGH (ref 70–99)
Glucose-Capillary: 120 mg/dL — ABNORMAL HIGH (ref 70–99)
Glucose-Capillary: 111 mg/dL — ABNORMAL HIGH (ref 70–99)
Glucose-Capillary: 86 mg/dL (ref 70–99)

## 2014-06-05 LAB — HEMOGLOBIN AND HEMATOCRIT, BLOOD
HCT: 26.8 % — ABNORMAL LOW (ref 39.0–52.0)
HCT: 25.2 % — ABNORMAL LOW (ref 39.0–52.0)
HCT: 25.3 % — ABNORMAL LOW (ref 39.0–52.0)
HEMOGLOBIN: 8.6 g/dL — AB (ref 13.0–17.0)
Hemoglobin: 9 g/dL — ABNORMAL LOW (ref 13.0–17.0)
Hemoglobin: 8.5 g/dL — ABNORMAL LOW (ref 13.0–17.0)

## 2014-06-05 LAB — PROCALCITONIN: PROCALCITONIN: 27.3 ng/mL

## 2014-06-05 LAB — CARBOXYHEMOGLOBIN
Carboxyhemoglobin: 0.6 % (ref 0.5–1.5)
Methemoglobin: 0.9 % (ref 0.0–1.5)
O2 Saturation: 80.4 %
Total hemoglobin: 13.8 g/dL (ref 13.5–18.0)

## 2014-06-05 LAB — URINE CULTURE
Colony Count: NO GROWTH
Culture: NO GROWTH
SPECIAL REQUESTS: NORMAL

## 2014-06-05 LAB — TROPONIN I
TROPONIN I: 0.13 ng/mL — AB (ref <0.031)
Troponin I: 0.1 ng/mL — ABNORMAL HIGH (ref <0.031)
Troponin I: 0.56 ng/mL (ref <0.031)

## 2014-06-05 LAB — MAGNESIUM
Magnesium: 1.4 mg/dL — ABNORMAL LOW (ref 1.5–2.5)
Magnesium: 1.5 mg/dL (ref 1.5–2.5)
Magnesium: 1.7 mg/dL (ref 1.5–2.5)

## 2014-06-05 LAB — LACTIC ACID, PLASMA: Lactic Acid, Venous: 2.1 mmol/L (ref 0.5–2.2)

## 2014-06-05 LAB — LIPASE, BLOOD: Lipase: 15 U/L (ref 11–59)

## 2014-06-05 LAB — AMYLASE: AMYLASE: 143 U/L — AB (ref 0–105)

## 2014-06-05 LAB — PHOSPHORUS
PHOSPHORUS: 1.6 mg/dL — AB (ref 2.3–4.6)
Phosphorus: 2.4 mg/dL (ref 2.3–4.6)

## 2014-06-05 SURGERY — EGD (ESOPHAGOGASTRODUODENOSCOPY)
Anesthesia: Moderate Sedation | Laterality: Left

## 2014-06-05 MED ORDER — LORAZEPAM 2 MG/ML IJ SOLN
2.0000 mg | Freq: Once | INTRAMUSCULAR | Status: AC
  Administered 2014-06-05: 2 mg via INTRAVENOUS
  Filled 2014-06-05: qty 1

## 2014-06-05 MED ORDER — METOPROLOL TARTRATE 1 MG/ML IV SOLN
INTRAVENOUS | Status: AC
  Administered 2014-06-05: 2.5 mg
  Filled 2014-06-05: qty 5

## 2014-06-05 MED ORDER — MAGNESIUM SULFATE 2 GM/50ML IV SOLN
2.0000 g | Freq: Once | INTRAVENOUS | Status: AC
  Administered 2014-06-05: 2 g via INTRAVENOUS
  Filled 2014-06-05: qty 50

## 2014-06-05 MED ORDER — POTASSIUM CHLORIDE 10 MEQ/100ML IV SOLN
10.0000 meq | INTRAVENOUS | Status: AC
  Administered 2014-06-05 (×2): 10 meq via INTRAVENOUS
  Filled 2014-06-05 (×2): qty 100

## 2014-06-05 MED ORDER — POTASSIUM PHOSPHATES 15 MMOLE/5ML IV SOLN
10.0000 mmol | Freq: Once | INTRAVENOUS | Status: AC
  Administered 2014-06-05: 10 mmol via INTRAVENOUS
  Filled 2014-06-05: qty 3.33

## 2014-06-05 MED ORDER — METOPROLOL TARTRATE 1 MG/ML IV SOLN
2.5000 mg | Freq: Once | INTRAVENOUS | Status: AC
  Administered 2014-06-05: 2.5 mg via INTRAVENOUS

## 2014-06-05 MED ORDER — METOPROLOL TARTRATE 1 MG/ML IV SOLN
2.5000 mg | INTRAVENOUS | Status: DC | PRN
  Administered 2014-06-06 – 2014-06-13 (×2): 5 mg via INTRAVENOUS
  Filled 2014-06-05 (×2): qty 5

## 2014-06-05 MED ORDER — LORAZEPAM 2 MG/ML IJ SOLN
0.5000 mg | INTRAMUSCULAR | Status: DC | PRN
  Administered 2014-06-06 – 2014-06-07 (×5): 1 mg via INTRAVENOUS
  Filled 2014-06-05 (×5): qty 1

## 2014-06-05 NOTE — Progress Notes (Signed)
UR Completed.  [phone]  

## 2014-06-05 NOTE — Interval H&P Note (Signed)
History and Physical Interval Note:  06/05/2014 3:04 PM  Marvin Leon  has presented today for surgery, with the diagnosis of GI bleed  The various methods of treatment have been discussed with the patient and family. After consideration of risks, benefits and other options for treatment, the patient has consented to  Procedure(s): ESOPHAGOGASTRODUODENOSCOPY (EGD) (Left) as a surgical intervention .  The patient's history has been reviewed, patient examined, no change in status, stable for surgery.  I have reviewed the patient's chart and labs.  Questions were answered to the patient's satisfaction.     Katy Brickell D

## 2014-06-05 NOTE — Progress Notes (Addendum)
CRITICAL VALUE ALERT  Critical value received: Troponin = 0.56  Date of notification:  06/05/14  Time of notification:   1311   Critical value read back:Yes.    Nurse who received alert:  Arnoldo LenisAlex Jaeden Westbay RN   MD notified (1st page):  Dr. Shirlee LatchMclean  Time of first page: 1315  MD notified (2nd page): Dr. Precious ReelAmed  Time of second page: 1335  Responding MD:  Dr. Precious ReelAmed  Time MD responded: 1340

## 2014-06-05 NOTE — Progress Notes (Signed)
Call from resident Patient doing well on pressors but hgb showing down drift but no active bleeding - [place] prenedoscopy score is 5   Recent Labs Lab 06/04/14 1354 06/04/14 1400 06/04/14 1925 06/04/14 2315 06/04/14 2342  HGB 11.5* 11.9* 10.1* 8.6* 8.2*   PLan - recheck hgb in few hours - if active bleeding or furhter hgb drop  - call GI  - PRBC tx threshold is < 7gm% or active bleeding  Dr. Kalman ShanMurali Nzinga Ferran, M.D., [facility]P Pulmonary and Critical Care Medicine Staff Physician [facility] Pager: [phone], If no answer or between  15:00h - 7:00h: call [phone]  06/05/2014 1:08 AM

## 2014-06-05 NOTE — Progress Notes (Signed)
Patient ID: Marvin Leon, male   DOB: 11/09/1932, 81 y.o.   MRN: [id number]    SUBJECTIVE: Patient remains intubated/sedated. He awakens to stimuli.  CVP 6.   Scheduled Meds: . sodium chloride   Intravenous Once  . antiseptic oral rinse  7 mL Mouth Rinse QID  . ceFEPime (MAXIPIME) IV  1 g Intravenous Q24H  . chlorhexidine  15 mL Mouth Rinse BID  . insulin aspart  2-6 Units Subcutaneous 6 times per day  . metronidazole  500 mg Intravenous Q8H   Continuous Infusions: . sodium chloride 1,000 mL (06/05/14 0700)  . sodium chloride Stopped (06/04/14 1649)  . dexmedetomidine Stopped (06/04/14 1842)  . fentaNYL infusion INTRAVENOUS 100 mcg/hr (06/05/14 1000)  . norepinephrine (LEVOPHED) Adult infusion Stopped (06/05/14 0900)  . pantoprozole (PROTONIX) infusion 8 mg/hr (06/05/14 0700)  . propofol Stopped (06/04/14 1450)   PRN Meds:.acetaminophen    Filed Vitals:   06/05/14 0800 06/05/14 0832 06/05/14 0900 06/05/14 1000  BP: 108/60  111/57 137/70  Pulse: 97 121 106 121  Temp: 98.8 F (37.1 C)  99 F (37.2 C) 99.4 F (37.4 C)  TempSrc: Core (Comment)     Resp: 19 25 16 16   Weight:      SpO2: 100% 99% 98% 98%    Intake/Output Summary (Last 24 hours) at 06/05/14 1249 Last data filed at 06/05/14 1000  Gross per 24 hour  Intake 6197.38 ml  Output   1460 ml  Net 4737.38 ml    LABS: Basic Metabolic Panel:  Recent Labs  28/41/3201/18/16 1400 06/04/14 2342 06/05/14 0300  NA 141 143 141  K 4.4 3.1* 3.7  CL 103 115* 114*  CO2 23 26 20   GLUCOSE 209* 132* 161*  BUN 37* 32* 32*  CREATININE 1.85* 1.43* 1.48*  CALCIUM 8.5 7.4* 7.8*  MG 1.1* 1.5 1.4*  PHOS 2.4  --  1.6*   Liver Function Tests:  Recent Labs  06/04/14 1149 06/04/14 1400  AST 32 33  ALT 12 12  ALKPHOS 44 41  BILITOT 1.1 0.8  PROT 4.5* 4.5*  ALBUMIN 2.7* 2.6*    Recent Labs  06/04/14 2342  LIPASE 15  AMYLASE 143*   CBC:  Recent Labs  06/04/14 1149  06/04/14 1400  06/04/14 2342 06/05/14 0300  06/05/14 0900  WBC 4.3  --  3.4*  --  2.3* 6.3  --   NEUTROABS 3.3  --  2.5  --   --   --   --   HGB 13.0  < > 11.9*  < > 8.2* 9.0* 9.0*  HCT 39.2  < > 36.2*  < > 24.2* 26.3* 26.8*  MCV 97.0  --  96.3  --  92.4 92.0  --   PLT 139*  --  130*  --  85* 101*  --   < > = values in this interval not displayed. Cardiac Enzymes:  Recent Labs  06/04/14 1908 06/05/14 0100 06/05/14 0627  TROPONINI 0.09* 0.10* 0.13*   BNP: Invalid input(s): POCBNP D-Dimer: No results for input(s): DDIMER in the last 72 hours. Hemoglobin A1C: No results for input(s): HGBA1C in the last 72 hours. Fasting Lipid Panel: No results for input(s): CHOL, HDL, LDLCALC, TRIG, CHOLHDL, LDLDIRECT in the last 72 hours. Thyroid Function Tests: No results for input(s): TSH, T4TOTAL, T3FREE, THYROIDAB in the last 72 hours.  Invalid input(s): FREET3 Anemia Panel: No results for input(s): VITAMINB12, FOLATE, FERRITIN, TIBC, IRON, RETICCTPCT in the last 72 hours.  RADIOLOGY: Ct Head  Wo Contrast  06/04/2014   CLINICAL DATA:  Initial encounter for patient found unresponsive  EXAM: CT HEAD WITHOUT CONTRAST  TECHNIQUE: Contiguous axial images were obtained from the base of the skull through the vertex without intravenous contrast.  COMPARISON:  Outside MRI from 07/20/2009.  MRI from 05/04/2007.  FINDINGS: There is no evidence for acute hemorrhage, hydrocephalus, mass lesion, or abnormal extra-axial fluid collection. Diffuse loss of parenchymal volume is consistent with atrophy. No definite CT evidence for acute infarction. The visualized paranasal sinuses and mastoid air cells are clear.  IMPRESSION: Mild atrophy.  No acute intracranial abnormality.   Electronically Signed   By: Kennith Center M.D.   On: 06/04/2014 16:03   Dg Chest Port 1 View  06/04/2014   CLINICAL DATA:  Acute respiratory failure. Upper GI bleed. New endotracheal tube and central line placement.  EXAM: PORTABLE CHEST - 1 VIEW 1:25 p.m.  COMPARISON:  06/04/2014 at  11:08 a.m.  FINDINGS: Endotracheal tube is in good position at the level of the thoracic inlet. New left jugular vein catheter tip is in the superior vena cava above the cavoatrial junction in good position. New NG tube tip is below the diaphragm.  The heart size and pulmonary vascularity are normal. Right lung is clear. Slight haziness in the left lung could represent unilateral pulmonary edema. Chronic bilateral apical pleural thickening.  IMPRESSION: Tubes and lines in good position. Hazy infiltrate in the left lung has improved and probably represents unilateral pulmonary edema.   Electronically Signed   By: Geanie Cooley M.D.   On: 06/04/2014 15:13   Dg Chest Portable 1 View  06/04/2014   CLINICAL DATA:  Intubation.  EXAM: PORTABLE CHEST - 1 VIEW  COMPARISON:  02/16/2014.  FINDINGS: Endotracheal tube noted with its tip 1.3 cm above the carina. Diffuse left lung infiltrate noted consistent with pneumonia. Right lung is clear. Heart size is normal. No acute bony abnormality.  IMPRESSION: 1. Endotracheal tube noted with its tip 1.3 cm above the carina. 2. Diffuse left lung pulmonary infiltrate.   Electronically Signed   By: Maisie Fus  Register   On: 06/04/2014 11:32   Dg Abd Portable 1v  06/04/2014   CLINICAL DATA:  Gastrointestinal bleeding  EXAM: PORTABLE ABDOMEN - 1 VIEW  COMPARISON:  None.  FINDINGS: There is a nasogastric tube with tip over the gastric body. A pelvic thermistor is also noted.  Bowel gas pattern is nonobstructive.  A right iliac artery stent is noted.  IMPRESSION: Nasogastric tube tip over the gastric body.   Electronically Signed   By: Tiburcio Pea M.D.   On: 06/04/2014 23:54    PHYSICAL EXAM General: Intubated/sedated Neck: No JVD, no thyromegaly or thyroid nodule.  Lungs: Dependent crackles.  CV: Nondisplaced PMI.  Heart regular S1/S2, no S3/S4, no murmur.  No peripheral edema.   Abdomen: Soft, nontender, no hepatosplenomegaly, no distention.  Neurologic: awakens to stimuli    Extremities: No clubbing or cyanosis.   TELEMETRY: Reviewed telemetry pt in sinus tachy, 110s  ASSESSMENT AND PLAN: 81 yo with CAD s/p PCI, PAD s/p recent DES to [facility] in 10/15 presented with upper GI bleeding/coffee grounds emesis and altered mental status.  He was intubated for airway protection.  He has possible left-sided aspiration PNA.   1. Tachycardia: HR has been fast at times, currently in 100s-110s.  It appears that it has been sinus tachycardia, likely a response to his medical illness.  2. Upper GI bleeding: Coffee grounds emesis.  Hemoglobin 9,  transfuse < 8 with active bleeding.  Off ASA and Plavix.  Recent PCI was a peripheral intervention, lower risk of problems with stent off antiplatelet agents.  3. Elevated troponin: Mild elevation to 0.13.  Likely due to hypotension/tachycardia. He was on norepinephrine overnight, now off.  Will get echocardiogram.  4. ID: Suspect aspiration PNA.  Febrile with elevated PCT.  Abx per primary service.   Marvin Leon 06/05/2014 12:55 PM

## 2014-06-05 NOTE — Progress Notes (Signed)
PULMONARY / CRITICAL CARE MEDICINE   Name: Marvin Leon MRN: [id number] DOB: 1932-11-20    ADMISSION DATE:  06/04/2014 CONSULTATION DATE:  06/04/2014  REFERRING MD :  EDP  CHIEF COMPLAINT:  VDRF and upper GI bleed  INITIAL PRESENTATION: 81 year old male hx of arthritis, GERD, HLD, HTN, CAD s/p 3 stents, emphysema, PAD , DMII on daily ASA and plavix since 02/22/14 since DES  iliac stent placement 02/2014, but no NSAID use who presents to the hospital with hematemesis and AMS.  In the ED the patient was noted to not be protecting his airway and was intubated for airway protection.  OGT was placed with 1500 ml of coffee ground black material was noted.  PCCM was called to admit to the ICU.  No recent etoh history. Normal cscope and endoscope 2009.  STUDIES:  1/18 NGT lavaged with only coffee grounds, no active bleeding. 1/18 CT - mild atrophy.  1/18 KUB - normal.  SIGNIFICANT EVENTS: 1/18 UGI bleed. hgb dropped.  PAST MEDICAL HISTORY :   has a past medical history of Arthritis; GERD (gastroesophageal reflux disease); Hyperlipidemia; Hypertension; Coronary artery disease; Carotid artery disease; Peripheral arterial disease; Emphysema; Shortness of breath; Diabetes mellitus without complication; and Anxiety.  has past surgical history that includes Colonoscopy; Cardiac catheterization; Cholecystectomy (N/A, 04/07/2013); Iliac artery stent (Right, 02/22/2014); and Coronary angioplasty. Prior to Admission medications   Medication Sig Start Date End Date Taking? Authorizing Provider  ALPRAZolam Prudy Feeler) 1 MG tablet Take 1 mg by mouth 2 (two) times daily.     Yes Historical Provider, MD  aspirin EC 81 MG tablet Take 81 mg by mouth every morning.     Yes Historical Provider, MD  cetirizine (ZYRTEC) 10 MG tablet Take 5 mg by mouth daily.   Yes Historical Provider, MD  cholecalciferol (VITAMIN D) 400 UNITS TABS tablet Take 400 Units by mouth 2 (two) times daily.    Yes Historical Provider, MD   clopidogrel (PLAVIX) 75 MG tablet Take 1 tablet (75 mg total) by mouth daily. 02/23/14  Yes Rhonda G Barrett, PA-C  dexlansoprazole (DEXILANT) 60 MG capsule Take 60 mg by mouth daily before breakfast.    Yes Historical Provider, MD  docusate sodium (COLACE) 100 MG capsule Take 200 mg by mouth daily.    Yes Historical Provider, MD  ezetimibe-simvastatin (VYTORIN) 10-40 MG per tablet Take 1 tablet by mouth 3 (three) times a week. Mondays, Thursdays and Saturdays   Yes Historical Provider, MD  Ferrous Sulfate (IRON) 28 MG TABS Take 28 mg by mouth once a week. Wednesdays   Yes Historical Provider, MD  isosorbide mononitrate (IMDUR) 30 MG 24 hr tablet Take 15 mg by mouth at bedtime.     Yes Historical Provider, MD  metFORMIN (GLUCOPHAGE) 1000 MG tablet Take 1 tablet (1,000 mg total) by mouth 2 (two) times daily with a meal. HOLD for 2 days, restart on 02/25/2014 02/23/14  Yes Rhonda G Barrett, PA-C  metoCLOPramide (REGLAN) 10 MG tablet Take 5 mg by mouth daily before breakfast.   Yes Historical Provider, MD  metoprolol (TOPROL-XL) 50 MG 24 hr tablet Take 25 mg by mouth every morning.    Yes Historical Provider, MD  morphine (MSIR) 15 MG tablet Take 15 mg by mouth 3 (three) times daily as needed for severe pain.   Yes Historical Provider, MD  Multiple Vitamins-Minerals (MULTIVITAMINS THER. W/MINERALS) TABS Take 1 tablet by mouth daily.     Yes Historical Provider, MD  niacin (NIASPAN) 750 MG  CR tablet Take 750-1,500 mg by mouth at bedtime. Alternates every 2 days - 1500mg  x 2 days, 750mg  x 2 days, 1500mg  x 2 days.....   Yes Historical Provider, MD  PARoxetine (PAXIL) 20 MG tablet Take 20 mg by mouth every evening.     Yes Historical Provider, MD  pregabalin (LYRICA) 75 MG capsule Take 1 capsule (75 mg total) by mouth 2 (two) times daily. 12/05/13  Yes Ronal Fear, NP  Probiotic Product (ULTRAFLORA IMMUNE HEALTH PO) Take 1 tablet by mouth daily before breakfast.   Yes Historical Provider, MD  tamsulosin  (FLOMAX) 0.4 MG CAPS Take 0.4 mg by mouth daily after supper.  10/11/12  Yes Historical Provider, MD   Allergies  Allergen Reactions  . Darvocet [Propoxyphene N-Acetaminophen] Nausea And Vomiting  . Penicillins Nausea And Vomiting    FAMILY HISTORY:  indicated that his mother is deceased. He indicated that his father is deceased.  SOCIAL HISTORY:  reports that he quit smoking about 37 years ago. He has never used smokeless tobacco. He reports that he does not drink alcohol or use illicit drugs.  REVIEW OF SYSTEMS:  Unattainable, patient is sedated and intubated.  SUBJECTIVE:   VITAL SIGNS: Temp:  [98.6 F (37 C)-102.8 F (39.3 C)] 98.9 F (37.2 C) (01/19 0700) Pulse Rate:  [53-157] 101 (01/19 0700) Resp:  [15-28] 16 (01/19 0700) BP: (73-200)/(42-97) 99/56 mmHg (01/19 0700) SpO2:  [64 %-100 %] 99 % (01/19 0700) FiO2 (%):  [40 %-100 %] 40 % (01/19 0700) Weight:  [73.701 kg (162 lb 7.7 oz)-77.8 kg (171 lb 8.3 oz)] 77.8 kg (171 lb 8.3 oz) (01/19 0500) HEMODYNAMICS: CVP:  [6 mmHg-10 mmHg] 6 mmHg VENTILATOR SETTINGS: Vent Mode:  [-] PRVC FiO2 (%):  [40 %-100 %] 40 % Set Rate:  [16 bmp] 16 bmp Vt Set:  [600 mL] 600 mL PEEP:  [5 cmH20] 5 cmH20 Plateau Pressure:  [7 cmH20-20 cmH20] 18 cmH20 INTAKE / OUTPUT:  Intake/Output Summary (Last 24 hours) at 06/05/14 0757 Last data filed at 06/05/14 1610  Gross per 24 hour  Intake 8560.38 ml  Output   2485 ml  Net 6075.38 ml    PHYSICAL EXAMINATION: General:  Intubated, sedated. NAD. Neuro: moves exts, follows commands.  HEENT:  [place]/AT, NGT draining coffee ground material. Cardiovascular:  regular, tachy, Nl S1/S2, -M/R/G. Lungs:  CTAB Abdomen:  Soft, NT, ND and +BS. Musculoskeletal:  -edema and -tenderness. Skin:  Intact.  LABS:  CBC  Recent Labs Lab 06/04/14 1400  06/04/14 2315 06/04/14 2342 06/05/14 0300  WBC 3.4*  --   --  2.3* 6.3  HGB 11.9*  < > 8.6* 8.2* 9.0*  HCT 36.2*  < > 25.1* 24.2* 26.3*  PLT 130*  --   --   85* 101*  < > = values in this interval not displayed. Coag's  Recent Labs Lab 06/04/14 1149 06/04/14 1400  APTT  --  30  INR 1.28 1.29   BMET  Recent Labs Lab 06/04/14 1400 06/04/14 2342 06/05/14 0300  NA 141 143 141  K 4.4 3.1* 3.7  CL 103 115* 114*  CO2 23 26 20   BUN 37* 32* 32*  CREATININE 1.85* 1.43* 1.48*  GLUCOSE 209* 132* 161*   Electrolytes  Recent Labs Lab 06/04/14 1400 06/04/14 2342 06/05/14 0300  CALCIUM 8.5 7.4* 7.8*  MG 1.1* 1.5 1.4*  PHOS 2.4  --  1.6*   Sepsis Markers  Recent Labs Lab 06/04/14 1150 06/04/14 1400 06/04/14 2342 06/05/14 0450  LATICACIDVEN 6.9* 6.4* 2.1  --   PROCALCITON  --  9.08  --  27.30   ABG  Recent Labs Lab 06/04/14 1247 06/04/14 1451 06/05/14 0640  PHART 7.280* 7.330* 7.381  PCO2ART 46.4* 39.1 33.4*  PO2ART 323.0* 60.0* 127.0*   Liver Enzymes  Recent Labs Lab 06/04/14 1149 06/04/14 1400  AST 32 33  ALT 12 12  ALKPHOS 44 41  BILITOT 1.1 0.8  ALBUMIN 2.7* 2.6*   Cardiac Enzymes  Recent Labs Lab 06/04/14 1908 06/05/14 0100 06/05/14 0627  TROPONINI 0.09* 0.10* 0.13*   Glucose  Recent Labs Lab 06/04/14 1053 06/04/14 1623 06/04/14 1940 06/05/14 0026 06/05/14 0400  GLUCAP 196* 164* 149* 116* 147*    Imaging Ct Head Wo Contrast  06/04/2014   CLINICAL DATA:  Initial encounter for patient found unresponsive  EXAM: CT HEAD WITHOUT CONTRAST  TECHNIQUE: Contiguous axial images were obtained from the base of the skull through the vertex without intravenous contrast.  COMPARISON:  Outside MRI from 07/20/2009.  MRI from 05/04/2007.  FINDINGS: There is no evidence for acute hemorrhage, hydrocephalus, mass lesion, or abnormal extra-axial fluid collection. Diffuse loss of parenchymal volume is consistent with atrophy. No definite CT evidence for acute infarction. The visualized paranasal sinuses and mastoid air cells are clear.  IMPRESSION: Mild atrophy.  No acute intracranial abnormality.    Electronically Signed   By: Kennith Center M.D.   On: 06/04/2014 16:03   Dg Chest Port 1 View  06/04/2014   CLINICAL DATA:  Acute respiratory failure. Upper GI bleed. New endotracheal tube and central line placement.  EXAM: PORTABLE CHEST - 1 VIEW 1:25 p.m.  COMPARISON:  06/04/2014 at 11:08 a.m.  FINDINGS: Endotracheal tube is in good position at the level of the thoracic inlet. New left jugular vein catheter tip is in the superior vena cava above the cavoatrial junction in good position. New NG tube tip is below the diaphragm.  The heart size and pulmonary vascularity are normal. Right lung is clear. Slight haziness in the left lung could represent unilateral pulmonary edema. Chronic bilateral apical pleural thickening.  IMPRESSION: Tubes and lines in good position. Hazy infiltrate in the left lung has improved and probably represents unilateral pulmonary edema.   Electronically Signed   By: Geanie Cooley M.D.   On: 06/04/2014 15:13   Dg Chest Portable 1 View  06/04/2014   CLINICAL DATA:  Intubation.  EXAM: PORTABLE CHEST - 1 VIEW  COMPARISON:  02/16/2014.  FINDINGS: Endotracheal tube noted with its tip 1.3 cm above the carina. Diffuse left lung infiltrate noted consistent with pneumonia. Right lung is clear. Heart size is normal. No acute bony abnormality.  IMPRESSION: 1. Endotracheal tube noted with its tip 1.3 cm above the carina. 2. Diffuse left lung pulmonary infiltrate.   Electronically Signed   By: Maisie Fus  Register   On: 06/04/2014 11:32   Dg Abd Portable 1v  06/04/2014   CLINICAL DATA:  Gastrointestinal bleeding  EXAM: PORTABLE ABDOMEN - 1 VIEW  COMPARISON:  None.  FINDINGS: There is a nasogastric tube with tip over the gastric body. A pelvic thermistor is also noted.  Bowel gas pattern is nonobstructive.  A right iliac artery stent is noted.  IMPRESSION: Nasogastric tube tip over the gastric body.   Electronically Signed   By: Tiburcio Pea M.D.   On: 06/04/2014 23:54     ASSESSMENT /  PLAN:  PULMONARY OETT 1/18>>> A: VDRF due to AMS, hematemesis, ?aspiration. Mixed acidosis - mainly metabolic.  improving. P:   - CXR showed some infiltrate on left initially that's improving. On abx. - See ID section. - Full vent support. Wean as tolerates. WUA. Daily SVT. Likely extubate after EGD.   CARDIOVASCULAR CVL L IJ TLC 1/18>>> A: Sinus tach - likely 2/2 to blood loss, sepsis, shock   borderline BP - improving, on pressors. CAD Troponin elevation, lactic acid in 6's.  P:  BP didn't respond to fluid boluses so currently on levo drip. Continue to wean off levo as able.  - IVF NS 100 ml/hr.- Follow CVP. - trend trops - stable overall.  - Hold home anti-HTN. - ECHO pending. No previous on chart.  RENAL A:  B/L crt 1.05. Currently ~1.4. Likely prerenal with acute blood loss vs septic shock Low mag, low K+, low phos.  Mixed acidosis - mainly metabolic, likely from lactic acidosis/sepsis. Improving P:   - Hydrate and support BP.  - Correct electrolytes as indicated.  GASTROINTESTINAL A:  coffee ground emesis - ddx is broad including ulcer, erosisions, varices, AV malformations, erosive esophagitis, mallory-weiss tear. Sees Dr. Loreta Ave as outpatient.  - normal EGD and cscope 2009.  -[place]all proendoscopy score of 5.  -  P:   - GI called and is on board.-NGT lavage showing coffee ground emesis, indicating likely ongoing bleeding. Will likely need EGD today. - on EGDT septic shock protocol phase 2.  - KUB normal, amylase, lipase normal. Protonix drip. - Hold anti-platelets. - SCD.  HEMATOLOGIC A:  Acute on chronic anemia - Baseline hgb ~ 12-13, currently down around 8-9. Some of this could be dilutional too, received about 7L of fluid. - leukoopenia then now normal WBC - thrombocytopenia chronic.  P:  - Type and screen. Didn't transfuse yet. q6hr hgb check. - Transfuse goal hgb <8 since having ?active GI bleed and has Cardiac disease.  INFECTIOUS A:  Tmax 102.8. PCT  27.30.  ?aspiration per family. CXR showed left infiltrates which are improving. LA 6.9 max, then resolved. P:   BCx2 1/18>>> UC 1/18>>> Sputum 1/18>>>  Cefepime 1/18 >> Flagyl 1/18 >>  Cont cefepime and flagyl for now for ?asp PNA. Likely possible source is PNA because he doesn't have ab tenderness or any other probable causes.  CXR repeat AM  ENDOCRINE A:  DM.   No adrenal insuf - cortisol (56.6) P:   - ISS. - stop steroid.  NEUROLOGIC A:  AMS, source unknown, likely from septic shock and blood loss P:   RASS goal: 0 - Precedex and fentanyl gtt. Wean as possible. - takes xanax and morphine at home.  - Head CT normal.   FAMILY  - Updates: Family updated bedside 1/18.  Hyacinth Meeker, M.D PGY-1 Internal Medicine Pager [phone]  ATTENDING NOTE: I have personally reviewed patient's available data, including medical history, events of note, physical examination and test results as part of my evaluation. I have discussed with resident/NP and other careteam providers such as pharmacist, RN and RRT & co-ordinated with consultants. In addition, I personally evaluated patient and elicited key history of hematemesis, shock, altered mental status requiring mechanical ventilation exam findings of clear lungs, sedated, S1-S2 normal & labs showing AKI, mild metabolic acidosis, lactate clearance.  Chest x-ray showing left-sided infiltrate ? Aspiration  Recommend-Taper pressors to off Would advocate for EGD, since would need to know how long to hold Plavix in this situation. Transfuse to a goal hemoglobin of 8. If no further events, plan to wean to extubation over next 24 hours. Acute  encephalopathy-probably related to narcotics and shock, now improving Rest per medical resident whose note is outlined above and that I agree with and edited in full.   Care during the described time interval was provided by me and/or other providers on the critical care team.  I have reviewed this patient's  available data, including medical history, events of note, physical examination and test results as part of my evaluation  CC time x 5253m  Oretha MilchALVA,RAKESH V. MD

## 2014-06-05 NOTE — H&P (View-Only) (Signed)
Reason for Consult: Upper GI bleed Referring Physician: CCM  Marvin Leon is an 81 y.o. male.  HPI: 81 year old white male, with multiple medical problems listed below, was in the [place] till 2 days ago when his daughter called EMS as he had a rapid heart rate. Reportedly he was feeling lethargic and seemed somewhat confused and disoriented but he refused to come to the hospital . This morning when his wife woke up to check on him he was more confused and had vomited some coffee grounds on his bed. There was no fresh blood or clots in the emesis. The family was not sure how long he had been vomiting and his daugther then called EMS. He had been complaining of abdominal pain for a few days now saying his "hiatal hernia was acting up". He has been getting Morphine for back pain from Dr. Nelva Bush and had received an injection in his back on 05/15/14. He has also been on Plavix and Aspirin since 02/22/14 when a iliac artery stent was placed by Dr. Gwenlyn Found. There is no history of any NSAID use or abuse. He had a normal EGD/Colonoscopy in 2009. He had an adenomatous polyp removed in 1998. His got his last dose of Plavix and Aspirin yesterday.      Past Medical History  Diagnosis Date  . Arthritis   . GERD (gastroesophageal reflux disease)   . Hyperlipidemia   . Hypertension   . Coronary artery disease   . Carotid artery disease   . Peripheral arterial disease     50% ostial right common iliac artery stenosis without claudication  . Emphysema   . Shortness of breath   . Diabetes mellitus without complication     type 2  . Anxiety disorder    Past Surgical History  Procedure Laterality Date  . Colonoscopy    . Cardiac catheterization      3 stents placed  . Cholecystectomy N/A 04/07/2013    Procedure: LAPAROSCOPIC CHOLECYSTECTOMY WITH INTRAOPERATIVE CHOLANGIOGRAM;  Surgeon: Merrie Roof, MD;  Location: [facility];  Service: General;  Laterality: N/A;  . Iliac artery stent Right 02/22/2014    Dr. Gwenlyn Found  7 mm x 22 mm  ICast covered stent  . Coronary angioplasty      all 3 heart vessels    Family History  Problem Relation Age of Onset  . Heart disease Mother   . Heart disease Father    Social History:  reports that he quit smoking about 37 years ago. He has never used smokeless tobacco. He reports that he does not drink alcohol or use illicit drugs.  Allergies:  Allergies  Allergen Reactions  . Darvocet [Propoxyphene N-Acetaminophen] Nausea And Vomiting  . Penicillins Nausea And Vomiting   Medications: I have reviewed the patient's current medications.  Results for orders placed or performed during the hospital encounter of 06/04/14 (from the past 48 hour(s))  CBG monitoring, ED     Status: Abnormal   Collection Time: 06/04/14 10:53 AM  Result Value Ref Range   Glucose-Capillary 196 (H) 70 - 99 mg/dL  I-Stat Chem 8, ED     Status: Abnormal   Collection Time: 06/04/14 11:00 AM  Result Value Ref Range   Sodium 137 135 - 145 mmol/L   Potassium 4.2 3.5 - 5.1 mmol/L   Chloride 104 96 - 112 mEq/L   BUN 39 (H) 6 - 23 mg/dL   Creatinine, Ser 1.40 (H) 0.50 - 1.35 mg/dL   Glucose,  Bld 197 (H) 70 - 99 mg/dL   Calcium, Ion 0.91 (L) 1.13 - 1.30 mmol/L   TCO2 16 0 - 100 mmol/L   Hemoglobin 15.0 13.0 - 17.0 g/dL   HCT 44.0 39.0 - 52.0 %  I-Stat CG4 Lactic Acid, ED     Status: Abnormal   Collection Time: 06/04/14 11:00 AM  Result Value Ref Range   Lactic Acid, Venous 6.66 (H) 0.5 - 2.2 mmol/L  POC occult blood, ED     Status: None   Collection Time: 06/04/14 11:09 AM  Result Value Ref Range   Fecal Occult Bld NEGATIVE NEGATIVE  CBC with Differential     Status: Abnormal   Collection Time: 06/04/14 11:49 AM  Result Value Ref Range   WBC 4.3 4.0 - 10.5 K/uL   RBC 4.04 (L) 4.22 - 5.81 MIL/uL   Hemoglobin 13.0 13.0 - 17.0 g/dL   HCT 39.2 39.0 - 52.0 %   MCV 97.0 78.0 - 100.0 fL   MCH 32.2 26.0 - 34.0 pg   MCHC 33.2 30.0 - 36.0 g/dL   RDW 13.3 11.5 - 15.5 %   Platelets 139 (L) 150 -  400 K/uL   Neutrophils Relative % 79 (H) 43 - 77 %   Neutro Abs 3.3 1.7 - 7.7 K/uL   Lymphocytes Relative 12 12 - 46 %   Lymphs Abs 0.5 (L) 0.7 - 4.0 K/uL   Monocytes Relative 9 3 - 12 %   Monocytes Absolute 0.4 0.1 - 1.0 K/uL   Eosinophils Relative 0 0 - 5 %   Eosinophils Absolute 0.0 0.0 - 0.7 K/uL   Basophils Relative 0 0 - 1 %   Basophils Absolute 0.0 0.0 - 0.1 K/uL  Comprehensive metabolic panel     Status: Abnormal   Collection Time: 06/04/14 11:49 AM  Result Value Ref Range   Sodium 142 135 - 145 mmol/L    Comment: Please note change in reference range.   Potassium 3.9 3.5 - 5.1 mmol/L    Comment: Please note change in reference range.   Chloride 103 96 - 112 mEq/L   CO2 19 19 - 32 mmol/L   Glucose, Bld 189 (H) 70 - 99 mg/dL   BUN 37 (H) 6 - 23 mg/dL   Creatinine, Ser 1.80 (H) 0.50 - 1.35 mg/dL   Calcium 8.5 8.4 - 10.5 mg/dL   Total Protein 4.5 (L) 6.0 - 8.3 g/dL   Albumin 2.7 (L) 3.5 - 5.2 g/dL   AST 32 0 - 37 U/L   ALT 12 0 - 53 U/L   Alkaline Phosphatase 44 39 - 117 U/L   Total Bilirubin 1.1 0.3 - 1.2 mg/dL   GFR calc non Af Amer 34 (L) >90 mL/min   GFR calc Af Amer 40 (L) >90 mL/min    Comment: (NOTE) The eGFR has been calculated using the CKD EPI equation. This calculation has not been validated in all clinical situations. eGFR's persistently <90 mL/min signify possible Chronic Kidney Disease.    Anion gap 20 (H) 5 - 15  Protime-INR     Status: Abnormal   Collection Time: 06/04/14 11:49 AM  Result Value Ref Range   Prothrombin Time 16.1 (H) 11.6 - 15.2 seconds   INR 1.28 0.00 - 1.49  Troponin I     Status: None   Collection Time: 06/04/14 11:49 AM  Result Value Ref Range   Troponin I 0.03 <0.031 ng/mL    Comment:  NO INDICATION OF MYOCARDIAL INJURY. Please note change in reference range.   Lactic acid, plasma     Status: Abnormal   Collection Time: 06/04/14 11:50 AM  Result Value Ref Range   Lactic Acid, Venous 6.9 (H) 0.5 - 2.2 mmol/L   Urinalysis, Routine w reflex microscopic     Status: Abnormal   Collection Time: 06/04/14 12:29 PM  Result Value Ref Range   Color, Urine YELLOW YELLOW   APPearance HAZY (A) CLEAR   Specific Gravity, Urine 1.023 1.005 - 1.030   pH 6.5 5.0 - 8.0   Glucose, UA NEGATIVE NEGATIVE mg/dL   Hgb urine dipstick NEGATIVE NEGATIVE   Bilirubin Urine NEGATIVE NEGATIVE   Ketones, ur 40 (A) NEGATIVE mg/dL   Protein, ur NEGATIVE NEGATIVE mg/dL   Urobilinogen, UA 1.0 0.0 - 1.0 mg/dL   Nitrite NEGATIVE NEGATIVE   Leukocytes, UA SMALL (A) NEGATIVE  Urine microscopic-add on     Status: Abnormal   Collection Time: 06/04/14 12:29 PM  Result Value Ref Range   Squamous Epithelial / LPF FEW (A) RARE   WBC, UA 7-10 <3 WBC/hpf   RBC / HPF 0-2 <3 RBC/hpf   Bacteria, UA MANY (A) RARE  Prepare RBC     Status: None   Collection Time: 06/04/14 12:41 PM  Result Value Ref Range   Order Confirmation ORDER PROCESSED BY BLOOD BANK   Type and screen for Red Blood Exchange     Status: None (Preliminary result)   Collection Time: 06/04/14 12:41 PM  Result Value Ref Range   ABO/RH(D) O POS    Antibody Screen NEG    Sample Expiration 06/07/2014    Unit Number [id number]    Blood Component Type RED CELLS,LR    Unit division 00    Status of Unit ALLOCATED    Transfusion Status OK TO TRANSFUSE    Crossmatch Result Compatible    Unit Number [id number]    Blood Component Type RED CELLS,LR    Unit division 00    Status of Unit ALLOCATED    Transfusion Status OK TO TRANSFUSE    Crossmatch Result Compatible   ABO/Rh     Status: None   Collection Time: 06/04/14 12:41 PM  Result Value Ref Range   ABO/RH(D) O POS   I-Stat arterial blood gas, ED     Status: Abnormal   Collection Time: 06/04/14 12:47 PM  Result Value Ref Range   pH, Arterial 7.280 (L) 7.350 - 7.450   pCO2 arterial 46.4 (H) 35.0 - 45.0 mmHg   pO2, Arterial 323.0 (H) 80.0 - 100.0 mmHg   Bicarbonate 21.6 20.0 - 24.0 mEq/L   TCO2 23 0 - 100  mmol/L   O2 Saturation 100.0 %   Acid-base deficit 5.0 (H) 0.0 - 2.0 mmol/L   Patient temperature 37.8 C    Collection site RADIAL, ALLEN'S TEST ACCEPTABLE    Drawn by RT    Sample type ARTERIAL   Hemoglobin and hematocrit, blood     Status: Abnormal   Collection Time: 06/04/14  1:54 PM  Result Value Ref Range   Hemoglobin 11.5 (L) 13.0 - 17.0 g/dL   HCT 34.7 (L) 39.0 - 52.0 %  CBC with Differential     Status: Abnormal   Collection Time: 06/04/14  2:00 PM  Result Value Ref Range   WBC 3.4 (L) 4.0 - 10.5 K/uL   RBC 3.76 (L) 4.22 - 5.81 MIL/uL   Hemoglobin 11.9 (L) 13.0 - 17.0 g/dL  HCT 36.2 (L) 39.0 - 52.0 %   MCV 96.3 78.0 - 100.0 fL   MCH 31.6 26.0 - 34.0 pg   MCHC 32.9 30.0 - 36.0 g/dL   RDW 13.3 11.5 - 15.5 %   Platelets 130 (L) 150 - 400 K/uL   Neutrophils Relative % 74 43 - 77 %   Neutro Abs 2.5 1.7 - 7.7 K/uL   Lymphocytes Relative 18 12 - 46 %   Lymphs Abs 0.6 (L) 0.7 - 4.0 K/uL   Monocytes Relative 8 3 - 12 %   Monocytes Absolute 0.3 0.1 - 1.0 K/uL   Eosinophils Relative 0 0 - 5 %   Eosinophils Absolute 0.0 0.0 - 0.7 K/uL   Basophils Relative 0 0 - 1 %   Basophils Absolute 0.0 0.0 - 0.1 K/uL  Comprehensive metabolic panel     Status: Abnormal   Collection Time: 06/04/14  2:00 PM  Result Value Ref Range   Sodium 141 135 - 145 mmol/L    Comment: Please note change in reference range.   Potassium 4.4 3.5 - 5.1 mmol/L    Comment: Please note change in reference range.   Chloride 103 96 - 112 mEq/L   CO2 23 19 - 32 mmol/L   Glucose, Bld 209 (H) 70 - 99 mg/dL   BUN 37 (H) 6 - 23 mg/dL   Creatinine, Ser 1.85 (H) 0.50 - 1.35 mg/dL   Calcium 8.5 8.4 - 10.5 mg/dL   Total Protein 4.5 (L) 6.0 - 8.3 g/dL   Albumin 2.6 (L) 3.5 - 5.2 g/dL   AST 33 0 - 37 U/L   ALT 12 0 - 53 U/L   Alkaline Phosphatase 41 39 - 117 U/L   Total Bilirubin 0.8 0.3 - 1.2 mg/dL   GFR calc non Af Amer 33 (L) >90 mL/min   GFR calc Af Amer 38 (L) >90 mL/min    Comment: (NOTE) The eGFR has been  calculated using the CKD EPI equation. This calculation has not been validated in all clinical situations. eGFR's persistently <90 mL/min signify possible Chronic Kidney Disease.    Anion gap 15 5 - 15  APTT     Status: None   Collection Time: 06/04/14  2:00 PM  Result Value Ref Range   aPTT 30 24 - 37 seconds  Protime-INR     Status: Abnormal   Collection Time: 06/04/14  2:00 PM  Result Value Ref Range   Prothrombin Time 16.2 (H) 11.6 - 15.2 seconds   INR 1.29 0.00 - 1.49  Lactic acid, plasma     Status: Abnormal   Collection Time: 06/04/14  2:00 PM  Result Value Ref Range   Lactic Acid, Venous 6.4 (H) 0.5 - 2.2 mmol/L  Brain natriuretic peptide     Status: None   Collection Time: 06/04/14  2:00 PM  Result Value Ref Range   B Natriuretic Peptide 81.4 0.0 - 100.0 pg/mL    Comment: Please note change in reference range.  Magnesium     Status: Abnormal   Collection Time: 06/04/14  2:00 PM  Result Value Ref Range   Magnesium 1.1 (L) 1.5 - 2.5 mg/dL  Phosphorus     Status: None   Collection Time: 06/04/14  2:00 PM  Result Value Ref Range   Phosphorus 2.4 2.3 - 4.6 mg/dL  Ammonia     Status: None   Collection Time: 06/04/14  2:00 PM  Result Value Ref Range   Ammonia 12 11 -  32 umol/L    Comment: Please note change in reference range.  Procalcitonin - Baseline     Status: None   Collection Time: 06/04/14  2:00 PM  Result Value Ref Range   Procalcitonin 9.08 ng/mL    Comment:        Interpretation: PCT > 2 ng/mL: Systemic infection (sepsis) is likely, unless other causes are known. (NOTE)         ICU PCT Algorithm               Non ICU PCT Algorithm    ----------------------------     ------------------------------         PCT < 0.25 ng/mL                 PCT < 0.1 ng/mL     Stopping of antibiotics            Stopping of antibiotics       strongly encouraged.               strongly encouraged.    ----------------------------     ------------------------------       PCT  level decrease by               PCT < 0.25 ng/mL       >= 80% from peak PCT       OR PCT 0.25 - 0.5 ng/mL          Stopping of antibiotics                                             encouraged.     Stopping of antibiotics           encouraged.    ----------------------------     ------------------------------       PCT level decrease by              PCT >= 0.25 ng/mL       < 80% from peak PCT        AND PCT >= 0.5 ng/mL            Continuing antibiotics                                               encouraged.       Continuing antibiotics            encouraged.    ----------------------------     ------------------------------     PCT level increase compared          PCT > 0.5 ng/mL         with peak PCT AND          PCT >= 0.5 ng/mL             Escalation of antibiotics                                          strongly encouraged.      Escalation of antibiotics        strongly encouraged.   I-Stat arterial blood gas, ED     Status: Abnormal  Collection Time: 06/04/14  2:51 PM  Result Value Ref Range   pH, Arterial 7.330 (L) 7.350 - 7.450   pCO2 arterial 39.1 35.0 - 45.0 mmHg   pO2, Arterial 60.0 (L) 80.0 - 100.0 mmHg   Bicarbonate 20.5 20.0 - 24.0 mEq/L   TCO2 22 0 - 100 mmol/L   O2 Saturation 88.0 %   Acid-base deficit 5.0 (H) 0.0 - 2.0 mmol/L   Patient temperature 37.5 C    Collection site RADIAL, ALLEN'S TEST ACCEPTABLE    Drawn by RT    Sample type ARTERIAL   Glucose, capillary     Status: Abnormal   Collection Time: 06/04/14  4:23 PM  Result Value Ref Range   Glucose-Capillary 164 (H) 70 - 99 mg/dL   Ct Head Wo Contrast  06/04/2014   CLINICAL DATA:  Initial encounter for patient found unresponsive  EXAM: CT HEAD WITHOUT CONTRAST  TECHNIQUE: Contiguous axial images were obtained from the base of the skull through the vertex without intravenous contrast.  COMPARISON:  Outside MRI from 07/20/2009.  MRI from 05/04/2007.  FINDINGS: There is no evidence for acute  hemorrhage, hydrocephalus, mass lesion, or abnormal extra-axial fluid collection. Diffuse loss of parenchymal volume is consistent with atrophy. No definite CT evidence for acute infarction. The visualized paranasal sinuses and mastoid air cells are clear.  IMPRESSION: Mild atrophy.  No acute intracranial abnormality.   Electronically Signed   By: Misty Stanley M.D.   On: 06/04/2014 16:03   Dg Chest Port 1 View  06/04/2014   CLINICAL DATA:  Acute respiratory failure. Upper GI bleed. New endotracheal tube and central line placement.  EXAM: PORTABLE CHEST - 1 VIEW 1:25 p.m.  COMPARISON:  06/04/2014 at 11:08 a.m.  FINDINGS: Endotracheal tube is in good position at the level of the thoracic inlet. New left jugular vein catheter tip is in the superior vena cava above the cavoatrial junction in good position. New NG tube tip is below the diaphragm.  The heart size and pulmonary vascularity are normal. Right lung is clear. Slight haziness in the left lung could represent unilateral pulmonary edema. Chronic bilateral apical pleural thickening.  IMPRESSION: Tubes and lines in good position. Hazy infiltrate in the left lung has improved and probably represents unilateral pulmonary edema.   Electronically Signed   By: Rozetta Nunnery M.D.   On: 06/04/2014 15:13   Dg Chest Portable 1 View  06/04/2014   CLINICAL DATA:  Intubation.  EXAM: PORTABLE CHEST - 1 VIEW  COMPARISON:  02/16/2014.  FINDINGS: Endotracheal tube noted with its tip 1.3 cm above the carina. Diffuse left lung infiltrate noted consistent with pneumonia. Right lung is clear. Heart size is normal. No acute bony abnormality.  IMPRESSION: 1. Endotracheal tube noted with its tip 1.3 cm above the carina. 2. Diffuse left lung pulmonary infiltrate.   Electronically Signed   By: Marcello Moores  Register   On: 06/04/2014 11:32   Review of Systems  Unable to perform ROS  Blood pressure 109/68, pulse 153, temperature 101.6 F (38.7 C), temperature source Rectal, resp. rate  15, weight 74 kg (163 lb 2.3 oz), SpO2 95 %. Physical Exam  Constitutional: He has a sickly appearance. He appears ill. He is intubated.  HENT:  Head: Normocephalic and atraumatic.  Eyes: Lids are normal.  Neck: Trachea normal and normal range of motion. Neck supple.  Cardiovascular: Tachycardia present.   Respiratory: He is intubated.  GI:  Abdomen is soft and slightly distended with NO bowel sounds  Skin: Skin is warm and dry.   Assessment/Plan: 1) Coffee ground emesis with fever:?ulcer ?perforation-GERD/Hiatal hernia-On PPI drip. Plans are to hold of on an EGD tonight. Will re-evaluate tomorrow.   2) AODM/Diabetic gastroparesis.  3) Acute respiratory failure with hypoxia; .   4) Tachycardia-thought to be due to acute UGI vs sepsis/CAD/PVD/HTN. 5) Back pain with leg pain /claudication-under the care of Dr. Nelva Bush and Dr. Gwenlyn Found.   Janika Jedlicka 06/04/2014, 5:27 PM

## 2014-06-05 NOTE — Op Note (Signed)
[name] [address] [place], [id number]   ENDOSCOPY PROCEDURE REPORT  PATIENT: Marvin Leon, Marvin G  MR#: [id number] BIRTHDATE: 11-29-1932 , 81  yrs. old GENDER: male ENDOSCOPIST:Konstantinos Cordoba Elnoria HowardHung, MD REFERRED BY: PROCEDURE DATE:  06/05/2014 PROCEDURE:   EGD, diagnostic ASA CLASS:    Class III INDICATIONS: Coffee-ground emesis. MEDICATION: Fentanyl drip administered by the ICU nurse. TOPICAL ANESTHETIC:   Cetacaine Spray  DESCRIPTION OF PROCEDURE:   After the risks and benefits of the procedure were explained, informed consent was obtained.  The Pentax Gastroscope [id number]  endoscope was introduced through the mouth  and advanced to the second portion of the duodenum .  The instrument was slowly withdrawn as the mucosa was fully examined.   FINDINGS: An LA Grade D esophagitis was identified extending from the GE junction up to the 23 cm from the incisors.  The area was friable and there was a significant amount of hematin.  The gastric lumen also exhibited hematin and there was a large amount of retained gastric contents in the fundus.  Aggressive suctioning was not able to clear the fundus as the food particles were too large to suction.  The patient was rotated to his left lateral decubitus and right lateral decubitus position from the supine orientation. No evidence of any inflammation, ulcerations, erosions, polyps, masses, or vascular abnormalities noted in the gastric lumen and the duodenum.    Retroflexed views revealed no abnormalities. The scope was then withdrawn from the patient and the procedure completed.  COMPLICATIONS: There were no immediate complications.  ENDOSCOPIC IMPRESSION: 1) LA Class D esophagitis  - This is the source of the bleeding.  RECOMMENDATIONS: 1) Continue with Protonix.  Once extubated he needs to be on a PPI QD, 30 minutes before breakfast, indefinitely.  _______________________________ eSignedJeani Hawking:  Kissy Cielo, MD  06/05/2014 4:01 PM     cc:  CPT CODES: ICD CODES:  The ICD and CPT codes recommended by this software are interpretations from the data that the clinical staff has captured with the software.  The verification of the translation of this report to the ICD and CPT codes and modifiers is the sole responsibility of the health care institution and practicing physician where this report was generated.  [employer] will not be held responsible for the validity of the ICD and CPT codes included on this report.  AMA assumes no liability for data contained or not contained herein. CPT is a Publishing rights managerregistered trademark of the [employer].

## 2014-06-05 NOTE — Progress Notes (Signed)
eLink Physician-Brief Progress Note Patient Name: Marvin Leon DOB: 10-27-1932 MRN: [id number]   Date of Service  06/05/2014  HPI/Events of Note  WCT @ rate of low 160s with adequate BP. Pt given 2.5 mg metoprolol IV X 2 with resultant rhythm of sinus tach @ 118/min. RN notes that pt takes alprazolam regularly. There is no gastric tube available post EGD  eICU Interventions  PRN lorazepam ordered PRN metoprolol to maintain HR < 125/min     Intervention Category Major Interventions: Arrhythmia - evaluation and management  Billy FischerDavid Ariba Lehnen 06/05/2014, 8:08 PM

## 2014-06-06 ENCOUNTER — Encounter (HOSPITAL_COMMUNITY): Payer: Self-pay | Admitting: Gastroenterology

## 2014-06-06 ENCOUNTER — Inpatient Hospital Stay (HOSPITAL_COMMUNITY): Payer: Medicare Other

## 2014-06-06 DIAGNOSIS — J9600 Acute respiratory failure, unspecified whether with hypoxia or hypercapnia: Secondary | ICD-10-CM

## 2014-06-06 DIAGNOSIS — R7989 Other specified abnormal findings of blood chemistry: Secondary | ICD-10-CM

## 2014-06-06 LAB — CBC
HCT: 25.5 % — ABNORMAL LOW (ref 39.0–52.0)
Hemoglobin: 8.5 g/dL — ABNORMAL LOW (ref 13.0–17.0)
MCH: 31.6 pg (ref 26.0–34.0)
MCHC: 33.3 g/dL (ref 30.0–36.0)
MCV: 94.8 fL (ref 78.0–100.0)
Platelets: 99 10*3/uL — ABNORMAL LOW (ref 150–400)
RBC: 2.69 MIL/uL — ABNORMAL LOW (ref 4.22–5.81)
RDW: 14.3 % (ref 11.5–15.5)
WBC: 7.6 10*3/uL (ref 4.0–10.5)

## 2014-06-06 LAB — COMPREHENSIVE METABOLIC PANEL
ALBUMIN: 2.1 g/dL — AB (ref 3.5–5.2)
ALK PHOS: 38 U/L — AB (ref 39–117)
ALT: 14 U/L (ref 0–53)
AST: 26 U/L (ref 0–37)
Anion gap: 8 (ref 5–15)
BUN: 24 mg/dL — ABNORMAL HIGH (ref 6–23)
CALCIUM: 8.3 mg/dL — AB (ref 8.4–10.5)
CO2: 19 mmol/L (ref 19–32)
Chloride: 116 mEq/L — ABNORMAL HIGH (ref 96–112)
Creatinine, Ser: 1.42 mg/dL — ABNORMAL HIGH (ref 0.50–1.35)
GFR calc Af Amer: 53 mL/min — ABNORMAL LOW (ref >90)
GFR calc non Af Amer: 45 mL/min — ABNORMAL LOW (ref >90)
GLUCOSE: 110 mg/dL — AB (ref 70–99)
POTASSIUM: 3.6 mmol/L (ref 3.5–5.1)
SODIUM: 143 mmol/L (ref 135–145)
Total Bilirubin: 0.6 mg/dL (ref 0.3–1.2)
Total Protein: 3.9 g/dL — ABNORMAL LOW (ref 6.0–8.3)

## 2014-06-06 LAB — BASIC METABOLIC PANEL
Anion gap: 6 (ref 5–15)
BUN: 24 mg/dL — ABNORMAL HIGH (ref 6–23)
CO2: 21 mmol/L (ref 19–32)
CREATININE: 1.65 mg/dL — AB (ref 0.50–1.35)
Calcium: 8.4 mg/dL (ref 8.4–10.5)
Chloride: 115 mEq/L — ABNORMAL HIGH (ref 96–112)
GFR calc Af Amer: 44 mL/min — ABNORMAL LOW (ref >90)
GFR calc non Af Amer: 38 mL/min — ABNORMAL LOW (ref >90)
GLUCOSE: 148 mg/dL — AB (ref 70–99)
Potassium: 3.9 mmol/L (ref 3.5–5.1)
Sodium: 142 mmol/L (ref 135–145)

## 2014-06-06 LAB — HEMOGLOBIN AND HEMATOCRIT, BLOOD
HCT: 25.3 % — ABNORMAL LOW (ref 39.0–52.0)
HEMATOCRIT: 24.2 % — AB (ref 39.0–52.0)
Hemoglobin: 8.1 g/dL — ABNORMAL LOW (ref 13.0–17.0)
Hemoglobin: 8.5 g/dL — ABNORMAL LOW (ref 13.0–17.0)

## 2014-06-06 LAB — GLUCOSE, CAPILLARY
GLUCOSE-CAPILLARY: 114 mg/dL — AB (ref 70–99)
GLUCOSE-CAPILLARY: 117 mg/dL — AB (ref 70–99)
Glucose-Capillary: 111 mg/dL — ABNORMAL HIGH (ref 70–99)
Glucose-Capillary: 146 mg/dL — ABNORMAL HIGH (ref 70–99)
Glucose-Capillary: 97 mg/dL (ref 70–99)
Glucose-Capillary: 98 mg/dL (ref 70–99)

## 2014-06-06 LAB — PROCALCITONIN: Procalcitonin: 11.47 ng/mL

## 2014-06-06 LAB — TROPONIN I
TROPONIN I: 1.43 ng/mL — AB (ref <0.031)
Troponin I: 1.51 ng/mL (ref <0.031)

## 2014-06-06 MED ORDER — CLOPIDOGREL BISULFATE 75 MG PO TABS
75.0000 mg | ORAL_TABLET | Freq: Every day | ORAL | Status: DC
  Filled 2014-06-06: qty 1

## 2014-06-06 MED ORDER — POTASSIUM CHLORIDE 10 MEQ/50ML IV SOLN
10.0000 meq | INTRAVENOUS | Status: AC
  Administered 2014-06-06 (×4): 10 meq via INTRAVENOUS
  Filled 2014-06-06 (×4): qty 50

## 2014-06-06 MED ORDER — FENTANYL CITRATE 0.05 MG/ML IJ SOLN
50.0000 ug | INTRAMUSCULAR | Status: DC | PRN
  Administered 2014-06-06 – 2014-06-10 (×5): 50 ug via INTRAVENOUS
  Filled 2014-06-06 (×5): qty 2

## 2014-06-06 MED ORDER — HALOPERIDOL LACTATE 5 MG/ML IJ SOLN
2.0000 mg | INTRAMUSCULAR | Status: DC | PRN
  Administered 2014-06-06 – 2014-06-07 (×4): 2 mg via INTRAVENOUS
  Filled 2014-06-06 (×5): qty 1

## 2014-06-06 MED ORDER — DEXMEDETOMIDINE HCL IN NACL 200 MCG/50ML IV SOLN
0.4000 ug/kg/h | INTRAVENOUS | Status: DC
  Administered 2014-06-06: 0.6 ug/kg/h via INTRAVENOUS
  Administered 2014-06-06 (×2): 0.4 ug/kg/h via INTRAVENOUS
  Administered 2014-06-07: 0.6 ug/kg/h via INTRAVENOUS
  Administered 2014-06-07: 0.4 ug/kg/h via INTRAVENOUS
  Administered 2014-06-07 (×2): 0.8 ug/kg/h via INTRAVENOUS
  Filled 2014-06-06 (×7): qty 50

## 2014-06-06 MED ORDER — PROPOFOL 10 MG/ML IV EMUL
INTRAVENOUS | Status: AC
  Filled 2014-06-06: qty 100

## 2014-06-06 MED ORDER — METOPROLOL TARTRATE 1 MG/ML IV SOLN
5.0000 mg | Freq: Four times a day (QID) | INTRAVENOUS | Status: DC
  Administered 2014-06-06 – 2014-06-08 (×7): 5 mg via INTRAVENOUS
  Filled 2014-06-06 (×15): qty 5

## 2014-06-06 MED ORDER — SODIUM CHLORIDE 0.9 % IV BOLUS (SEPSIS): 1000.0000 mL | Freq: Once | INTRAVENOUS | Status: DC

## 2014-06-06 MED ORDER — PANTOPRAZOLE SODIUM 40 MG IV SOLR
40.0000 mg | Freq: Two times a day (BID) | INTRAVENOUS | Status: DC
  Administered 2014-06-06 – 2014-06-08 (×6): 40 mg via INTRAVENOUS
  Filled 2014-06-06 (×9): qty 40

## 2014-06-06 MED ORDER — CEFTRIAXONE SODIUM IN DEXTROSE 20 MG/ML IV SOLN
1.0000 g | INTRAVENOUS | Status: AC
  Administered 2014-06-06 – 2014-06-10 (×5): 1 g via INTRAVENOUS
  Filled 2014-06-06 (×5): qty 50

## 2014-06-06 MED ORDER — FUROSEMIDE 10 MG/ML IJ SOLN
40.0000 mg | Freq: Once | INTRAMUSCULAR | Status: AC
  Administered 2014-06-06: 40 mg via INTRAVENOUS
  Filled 2014-06-06: qty 4

## 2014-06-06 MED ORDER — FUROSEMIDE 10 MG/ML IJ SOLN: 20.0000 mg | Freq: Once | INTRAMUSCULAR | Status: DC

## 2014-06-06 MED ORDER — SODIUM CHLORIDE 0.9 % IV BOLUS (SEPSIS)
250.0000 mL | Freq: Once | INTRAVENOUS | Status: AC
  Administered 2014-06-06: 250 mL via INTRAVENOUS

## 2014-06-06 MED ORDER — MAGNESIUM SULFATE 2 GM/50ML IV SOLN
2.0000 g | Freq: Once | INTRAVENOUS | Status: AC
  Administered 2014-06-06: 2 g via INTRAVENOUS
  Filled 2014-06-06: qty 50

## 2014-06-06 NOTE — Progress Notes (Signed)
Echocardiogram 2D Echocardiogram has been performed.  Dorothey BasemanReel, Kipp Shank M 06/06/2014, 2:30 PM

## 2014-06-06 NOTE — Progress Notes (Signed)
Subjective: No acute events.  Objective: Vital signs in last 24 hours: Temp:  [99.2 F (37.3 C)-100.7 F (38.2 C)] 100 F (37.8 C) (01/20 1200) Pulse Rate:  [95-129] 114 (01/20 1200) Resp:  [0-30] 15 (01/20 1200) BP: (71-149)/(39-90) 122/71 mmHg (01/20 1200) SpO2:  [81 %-100 %] 98 % (01/20 1200) FiO2 (%):  [40 %] 40 % (01/20 1100) Weight:  [79.5 kg (175 lb 4.3 oz)] 79.5 kg (175 lb 4.3 oz) (01/20 0415)    Intake/Output from previous day: 01/19 0701 - 01/20 0700 In: 3550.5 [I.V.:3300.5; IV Piggyback:250] Out: 1265 [Urine:1265] Intake/Output this shift: Total I/O In: 201.1 [I.V.:201.1] Out: 1000 [Urine:1000]  General appearance: sedated and intubated GI: soft, non-tender; bowel sounds normal; no masses,  no organomegaly  Lab Results:  Recent Labs  06/04/14 2342 06/05/14 0300  06/05/14 2120 06/06/14 0420 06/06/14 0900  WBC 2.3* 6.3  --   --  7.6  --   HGB 8.2* 9.0*  < > 8.5* 8.5* 8.1*  HCT 24.2* 26.3*  < > 25.3* 25.5* 24.2*  PLT 85* 101*  --   --  99*  --   < > = values in this interval not displayed. BMET  Recent Labs  06/05/14 0300 06/05/14 1700 06/06/14 0420  NA 141 141 143  K 3.7 3.7 3.6  CL 114* 115* 116*  CO2 20 22 19   GLUCOSE 161* 106* 110*  BUN 32* 27* 24*  CREATININE 1.48* 1.48* 1.42*  CALCIUM 7.8* 8.1* 8.3*   LFT  Recent Labs  06/06/14 0420  PROT 3.9*  ALBUMIN 2.1*  AST 26  ALT 14  ALKPHOS 38*  BILITOT 0.6   PT/INR  Recent Labs  06/04/14 1149 06/04/14 1400  LABPROT 16.1* 16.2*  INR 1.28 1.29   Hepatitis Panel No results for input(s): HEPBSAG, HCVAB, HEPAIGM, HEPBIGM in the last 72 hours. C-Diff No results for input(s): CDIFFTOX in the last 72 hours. Fecal Lactopherrin No results for input(s): FECLLACTOFRN in the last 72 hours.  Studies/Results: Ct Head Wo Contrast  06/04/2014   CLINICAL DATA:  Initial encounter for patient found unresponsive  EXAM: CT HEAD WITHOUT CONTRAST  TECHNIQUE: Contiguous axial images were obtained  from the base of the skull through the vertex without intravenous contrast.  COMPARISON:  Outside MRI from 07/20/2009.  MRI from 05/04/2007.  FINDINGS: There is no evidence for acute hemorrhage, hydrocephalus, mass lesion, or abnormal extra-axial fluid collection. Diffuse loss of parenchymal volume is consistent with atrophy. No definite CT evidence for acute infarction. The visualized paranasal sinuses and mastoid air cells are clear.  IMPRESSION: Mild atrophy.  No acute intracranial abnormality.   Electronically Signed   By: Kennith CenterEric  Mansell M.D.   On: 06/04/2014 16:03   Dg Chest Port 1 View  06/06/2014   CLINICAL DATA:  Shortness of breath  EXAM: PORTABLE CHEST - 1 VIEW  COMPARISON:  06/04/2014  FINDINGS: Endotracheal tube tip at the clavicular heads. A left IJ catheter remains at the SVC level. Gastric suction tube has been removed.  Increasing diffuse interstitial opacities with a hazy appearance of the lower chest, likely pleural fluid. No pneumothorax. Heart size is normal. Unchanged aortic and hilar contours. Biapical pleural/extrapleural thickening which is symmetric.  IMPRESSION: 1. Endotracheal tube and left IJ catheter remain in good position. 2. Interval development of pulmonary edema and small pleural effusions.   Electronically Signed   By: Tiburcio PeaJonathan  Watts M.D.   On: 06/06/2014 04:56   Dg Chest Port 1 View  06/04/2014  CLINICAL DATA:  Acute respiratory failure. Upper GI bleed. New endotracheal tube and central line placement.  EXAM: PORTABLE CHEST - 1 VIEW 1:25 p.m.  COMPARISON:  06/04/2014 at 11:08 a.m.  FINDINGS: Endotracheal tube is in good position at the level of the thoracic inlet. New left jugular vein catheter tip is in the superior vena cava above the cavoatrial junction in good position. New NG tube tip is below the diaphragm.  The heart size and pulmonary vascularity are normal. Right lung is clear. Slight haziness in the left lung could represent unilateral pulmonary edema. Chronic  bilateral apical pleural thickening.  IMPRESSION: Tubes and lines in good position. Hazy infiltrate in the left lung has improved and probably represents unilateral pulmonary edema.   Electronically Signed   By: Geanie Cooley M.D.   On: 06/04/2014 15:13   Dg Abd Portable 1v  06/04/2014   CLINICAL DATA:  Gastrointestinal bleeding  EXAM: PORTABLE ABDOMEN - 1 VIEW  COMPARISON:  None.  FINDINGS: There is a nasogastric tube with tip over the gastric body. A pelvic thermistor is also noted.  Bowel gas pattern is nonobstructive.  A right iliac artery stent is noted.  IMPRESSION: Nasogastric tube tip over the gastric body.   Electronically Signed   By: Tiburcio Pea M.D.   On: 06/04/2014 23:54    Medications:  Scheduled: . sodium chloride   Intravenous Once  . antiseptic oral rinse  7 mL Mouth Rinse QID  . cefTRIAXone (ROCEPHIN)  IV  1 g Intravenous Q24H  . chlorhexidine  15 mL Mouth Rinse BID  . insulin aspart  2-6 Units Subcutaneous 6 times per day  . metoprolol  5 mg Intravenous 4 times per day  . metronidazole  500 mg Intravenous Q8H  . pantoprazole (PROTONIX) IV  40 mg Intravenous Q12H  . sodium chloride  1,000 mL Intravenous Once   Continuous: . dexmedetomidine 0.4 mcg/kg/hr (06/06/14 1212)    Assessment/Plan: 1) LA Grade D esophagitis. 2) Tachycardia. 3) Fever - ? Aspiration pnemonia.   The patient is stable from the GI standpoint.  His HGB is in the 8 range.  He still remains critically ill.  Plan: 1) Continue with Protonix. 2) Supportive care per CCM. 3) Signing off.   LOS: 2 days   Marvin Leon D 06/06/2014, 1:03 PM

## 2014-06-06 NOTE — Progress Notes (Signed)
200ml fentanyl wasted in sink from iv drip that was discontinued Witnessed by Diona Fantihris Mckeown RN

## 2014-06-06 NOTE — Progress Notes (Signed)
PULMONARY / CRITICAL CARE MEDICINE   Name: Marvin Leon MRN: [id number] DOB: [birth year]    ADMISSION DATE:  06/04/2014 CONSULTATION DATE:  06/04/2014  REFERRING MD :  EDP  CHIEF COMPLAINT:  VDRF and upper GI bleed  INITIAL PRESENTATION: 81 year old male hx of arthritis, GERD, HLD, HTN, CAD s/p 3 stents, emphysema, PAD , DMII on daily ASA and plavix since 02/22/14 since DES  iliac stent placement 02/2014, but no NSAID use who presents to the hospital with hematemesis and AMS.  In the ED the patient was noted to not be protecting his airway and was intubated for airway protection.  OGT was placed with 1500 ml of coffee ground black material was noted.  PCCM was called to admit to the ICU.  No recent etoh history. Normal cscope and endoscope 2009.  STUDIES:  1/18 NGT lavaged with only coffee grounds, no active bleeding. 1/18 CT - mild atrophy.  1/18 KUB - normal. 1/19: EGD. LA class D esophagitis. this is the source of the bleeding.  SIGNIFICANT EVENTS: 1/18 UGI bleed. hgb dropped. 1/19: had EGD, weaned off pressor.    SUBJECTIVE: denies pain. Sedated, intubated.  VITAL SIGNS: Temp:  [98.8 F (37.1 C)-100.7 F (38.2 C)] 100.1 F (37.8 C) (01/20 0600) Pulse Rate:  [95-128] 109 (01/20 0600) Resp:  [0-30] 17 (01/20 0600) BP: (71-141)/(39-70) 117/56 mmHg (01/20 0600) SpO2:  [81 %-100 %] 97 % (01/20 0600) FiO2 (%):  [40 %] 40 % (01/20 0425) Weight:  [79.5 kg (175 lb 4.3 oz)] 79.5 kg (175 lb 4.3 oz) (01/20 0415) HEMODYNAMICS: CVP:  [6 mmHg-8 mmHg] 8 mmHg VENTILATOR SETTINGS: Vent Mode:  [-] PRVC FiO2 (%):  [40 %] 40 % Set Rate:  [16 bmp] 16 bmp Vt Set:  [600 mL] 600 mL PEEP:  [5 cmH20] 5 cmH20 Pressure Support:  [10 cmH20] 10 cmH20 Plateau Pressure:  [18 cmH20-21 cmH20] 20 cmH20 INTAKE / OUTPUT:  Intake/Output Summary (Last 24 hours) at 06/06/14 0629 Last data filed at 06/06/14 0600  Gross per 24 hour  Intake 3713.67 ml  Output   1190 ml  Net 2523.67 ml    PHYSICAL  EXAMINATION: General:  Intubated, sedated. NAD. Neuro: moves exts, follows commands.  HEENT:  [place]/AT Cardiovascular:  regular, tachy, Nl S1/S2, -M/R/G. Lungs:  CTAB Abdomen:  Soft, NT, ND and +BS. Musculoskeletal:  -edema and -tenderness. Skin:  Intact.  LABS:  CBC  Recent Labs Lab 06/04/14 2342 06/05/14 0300  06/05/14 1500 06/05/14 2120 06/06/14 0420  WBC 2.3* 6.3  --   --   --  7.6  HGB 8.2* 9.0*  < > 8.6* 8.5* 8.5*  HCT 24.2* 26.3*  < > 25.2* 25.3* 25.5*  PLT 85* 101*  --   --   --  99*  < > = values in this interval not displayed. Coag's  Recent Labs Lab 06/04/14 1149 06/04/14 1400  APTT  --  30  INR 1.28 1.29   BMET  Recent Labs Lab 06/05/14 0300 06/05/14 1700 06/06/14 0420  NA 141 141 143  K 3.7 3.7 3.6  CL 114* 115* 116*  CO2 BUN 32* 27* 24*  CREATININE 1.48* 1.48* 1.42*  GLUCOSE 161* 106* 110*   Electrolytes  Recent Labs Lab 06/04/14 1400 06/04/14 2342 06/05/14 0300 06/05/14 1700 06/06/14 0420  CALCIUM 8.5 7.4* 7.8* 8.1* 8.3*  MG 1.1* 1.5 1.4* 1.7  --   PHOS 2.4  --  1.6* 2.4  --  Sepsis Markers  Recent Labs Lab 06/04/14 1150 06/04/14 1400 06/04/14 2342 06/05/14 0450 06/06/14 0420  LATICACIDVEN 6.9* 6.4* 2.1  --   --   PROCALCITON  --  9.08  --  27.30 11.47   ABG  Recent Labs Lab 06/04/14 1247 06/04/14 1451 06/05/14 0640  PHART 7.280* 7.330* 7.381  PCO2ART 46.4* 39.1 33.4*  PO2ART 323.0* 60.0* 127.0*   Liver Enzymes   Recent Labs Lab 06/04/14 1149 06/04/14 1400 06/06/14 0420  AST 32 33 26  ALT 12 12 14   ALKPHOS 44 41 38*  BILITOT 1.1 0.8 0.6  ALBUMIN 2.7* 2.6* 2.1*   Cardiac Enzymes  Recent Labs Lab 06/05/14 0627 06/05/14 1100 06/06/14 0008  TROPONINI 0.13* 0.56* 1.43*   Glucose  Recent Labs Lab 06/05/14 0828 06/05/14 1207 06/05/14 1542 06/05/14 1916 06/06/14 0014 06/06/14 0408  GLUCAP 141* 120* 111* 86 98 97    Imaging No results found.   ASSESSMENT /  PLAN:  PULMONARY OETT 1/18>>> A: VDRF due to AMS, hematemesis, ?aspiration. Mixed acidosis - mainly metabolic. Improving  P:   - CXR showed some infiltrate on left initially that's improving. Now has pleural effusion likely from fluid resus.  On abx for possible aspiration pna - See ID section. - on vent. Wean as tolerates. WUA. Daily SVT. Likely extubate today.  CARDIOVASCULAR CVL L IJ TLC 1/18>>> A: Sinus tach with occasional Wide complex tachy - likely 2/2 to blood loss, sepsis, shoc and NSTEMI, also may be due to benzo withdrawal as he is on it at home  hypotension - improved with pressor and fluid. CAD NSTENI - likely 2/2 to shock. Troponin elevation and wide complex tachy with left bundle, still trending 1.43, lactic acid in 6's cleared. P:  - off leveo drip.  - metoprolol 5mg  q6hr scheduled for now. Switch to po metoprolol later. - is on ativan prn since was on benzo at home. Would restart home xanax when takes PO. - stop fluid. Give lasix 40mg  IV once for diuresis as has cxr pleural effusion. - cont to trend trops to capture the peak qdaily.  - ECHO pending. No previous on chart. - has metoprolol PRN for tachycardia.   RENAL A:  B/L crt 1.05. Currently ~1.4. Likely prerenal or ATN with acute blood loss vs septic shock Low mag, low K+, low phos.  Mixed acidosis - mainly metabolic, likely from lactic acidosis/sepsis. Improving P:   - Hydrate and maintain normal pressure. - Correct electrolytes as indicated.  GASTROINTESTINAL A:   LA class D esophagitis. this is the source of the bleeding per EGD report.    P:   - switch protonix drip to 40 mg bid IV protonix. needs to be on PPI QD indefinitely. Will put on PO protonix later. - Hold anti-platelets. - SCD.  HEMATOLOGIC A:  Acute on chronic anemia - Baseline hgb ~ 12-13, currently down around 8-9. Likely from esophagitis seen on EGD - leukoopenia then now normal WBC - thrombocytopenia chronic.  P:  -  Didn't  transfuse yet. q6hr hgb check. Transfuse goal hgb <8 since having ?active GI bleed and has Cardiac disease.  INFECTIOUS A:  Tmax 102.8. PCT 27.30. PCT now trending down but had fever Tmax 100.8 overnight.  Overall fever curve down. ?aspiration per family. CXR showed left infiltrates which are improving. LA 6.9 max, then resolved. P:   BCx2 1/18>>> UC 1/18>>> Sputum 1/18>>> growing GPC in pairs (likely strep pneum).  Cefepime 1/18 >>1/20 Flagyl 1/18 >>  Ceftriaxone 1/20 >>  1/24? Changed cefepime to ceftriaxone.  ENDOCRINE A:  DM.   No adrenal insuf - cortisol (56.6) P:   - ISS. - stop steroid.  NEUROLOGIC A:  AMS likely from septic shock and blood loss P:   RASS goal: 0 - Precedex and fentanyl gtt currently. Stopped fentanyl drip, on prn fentanyl + precedex.   FAMILY  - Updates: Family updated bedside 1/20  Hyacinth Meeker, M.D PGY-1 Internal Medicine Pager [phone]  ATTENDING NOTE: I have personally reviewed patient's available data, including medical history, events of note, physical examination and test results as part of my evaluation. I have discussed with resident/NP and other careteam providers such as pharmacist, RN and RRT & co-ordinated with consultants. In addition, I personally evaluated patient and elicited key history of upper GI bleed, acute encephalopathy requiring intubation, shock-now resolved, esophagitis on EGD exam findings of mild agitation, intermittent wide complex tachycardia-responding to metoprolol & labs showing improving renal function, pro-calcitonin decreasing, mild anemia.   Recommend- would like to wean to extubation-but arrhythmia and mental status remained hurdles. Start metoprolol 5 every 6 and use when necessary for breakthrough tachycardia Use intermittent Fent now, and when necessary Ativan for agitation until able to use gut Follow troponins until peak identified Daughters updated at bedside  Rest per medical resident whose note is  outlined above and that I agree with and edited in full.   Care during the described time interval was provided by me and/or other providers on the critical care team.  I have reviewed this patient's available data, including medical history, events of note, physical examination and test results as part of my evaluation  CC time x 44m  Oretha Milch. MD

## 2014-06-06 NOTE — Progress Notes (Signed)
ANTIBIOTIC CONSULT NOTE - FOLLOW UP  Pharmacy Consult for Ceftriaxone Indication: rule out pneumonia  Allergies  Allergen Reactions  . Darvocet [Propoxyphene N-Acetaminophen] Nausea And Vomiting  . Penicillins Nausea And Vomiting    Patient Measurements: Weight: 175 lb 4.3 oz (79.5 kg)  Vital Signs: Temp: 99.9 F (37.7 C) (01/20 0700) BP: 117/71 mmHg (01/20 0745) Pulse Rate: 116 (01/20 0745) Intake/Output from previous day: 01/19 0701 - 01/20 0700 In: 3550.5 [I.V.:3300.5; IV Piggyback:250] Out: 1265 [Urine:1265] Intake/Output from this shift:    Labs:  Recent Labs  06/04/14 2342 06/05/14 0300  06/05/14 1500 06/05/14 1700 06/05/14 2120 06/06/14 0420  WBC 2.3* 6.3  --   --   --   --  7.6  HGB 8.2* 9.0*  < > 8.6*  --  8.5* 8.5*  PLT 85* 101*  --   --   --   --  99*  CREATININE 1.43* 1.48*  --   --  1.48*  --  1.42*  < > = values in this interval not displayed. Estimated Creatinine Clearance: 42.2 mL/min (by C-G formula based on Cr of 1.42). No results for input(s): VANCOTROUGH, VANCOPEAK, VANCORANDOM, GENTTROUGH, GENTPEAK, GENTRANDOM, TOBRATROUGH, TOBRAPEAK, TOBRARND, AMIKACINPEAK, AMIKACINTROU, AMIKACIN in the last 72 hours.   Microbiology: Recent Results (from the past 720 hour(s))  Blood culture (routine x 2)     Status: None (Preliminary result)   Collection Time: 06/04/14 11:50 AM  Result Value Ref Range Status   Specimen Description BLOOD HAND LEFT  Final   Special Requests BOTTLES DRAWN AEROBIC AND ANAEROBIC 2CC  Final   Culture   Final           BLOOD CULTURE RECEIVED NO GROWTH TO DATE CULTURE WILL BE HELD FOR 5 DAYS BEFORE ISSUING A FINAL NEGATIVE REPORT Performed at [facility]    Report Status PENDING  Incomplete  Blood culture (routine x 2)     Status: None (Preliminary result)   Collection Time: 06/04/14 11:54 AM  Result Value Ref Range Status   Specimen Description BLOOD LEFT FOREARM  Final   Special Requests BOTTLES DRAWN AEROBIC ONLY  2CC  Final   Culture   Final           BLOOD CULTURE RECEIVED NO GROWTH TO DATE CULTURE WILL BE HELD FOR 5 DAYS BEFORE ISSUING A FINAL NEGATIVE REPORT Performed at [facility]    Report Status PENDING  Incomplete  Culture, blood (routine x 2)     Status: None (Preliminary result)   Collection Time: 06/04/14  1:50 PM  Result Value Ref Range Status   Specimen Description BLOOD LEFT ANTECUBITAL  Final   Special Requests BOTTLES DRAWN AEROBIC AND ANAEROBIC 10MLS  Final   Culture   Final           BLOOD CULTURE RECEIVED NO GROWTH TO DATE CULTURE WILL BE HELD FOR 5 DAYS BEFORE ISSUING A FINAL NEGATIVE REPORT Note: Culture results may be compromised due to an excessive volume of blood received in culture bottles. Performed at [facility]    Report Status PENDING  Incomplete  Culture, blood (routine x 2)     Status: None (Preliminary result)   Collection Time: 06/04/14  2:00 PM  Result Value Ref Range Status   Specimen Description BLOOD LEFT HAND  Final   Special Requests BOTTLES DRAWN AEROBIC AND ANAEROBIC 10MLS  Final   Culture   Final           BLOOD CULTURE  RECEIVED NO GROWTH TO DATE CULTURE WILL BE HELD FOR 5 DAYS BEFORE ISSUING A FINAL NEGATIVE REPORT Performed at [facility]    Report Status PENDING  Incomplete  MRSA PCR Screening     Status: None   Collection Time: 06/04/14  4:20 PM  Result Value Ref Range Status   MRSA by PCR NEGATIVE NEGATIVE Final    Comment:        The GeneXpert MRSA Assay (FDA approved for NASAL specimens only), is one component of a comprehensive MRSA colonization surveillance program. It is not intended to diagnose MRSA infection nor to guide or monitor treatment for MRSA infections.   Urine culture     Status: None   Collection Time: 06/04/14  4:46 PM  Result Value Ref Range Status   Specimen Description URINE, CATHETERIZED  Final   Special Requests Normal  Final   Colony Count NO GROWTH Performed at [facility]   Final   Culture NO GROWTH Performed at [facility]   Final   Report Status 06/05/2014 FINAL  Final  Culture, respiratory (NON-Expectorated)     Status: None (Preliminary result)   Collection Time: 06/04/14  4:46 PM  Result Value Ref Range Status   Specimen Description TRACHEAL ASPIRATE  Final   Special Requests NONE  Final   Gram Stain   Final    FEW WBC PRESENT, PREDOMINANTLY MONONUCLEAR RARE SQUAMOUS EPITHELIAL CELLS PRESENT FEW GRAM POSITIVE COCCI IN PAIRS IN CLUSTERS MODERATE YEAST Performed at [facility]    Culture   Final    Culture reincubated for better growth Performed at [facility]    Report Status PENDING  Incomplete    Anti-infectives    Start     Dose/Rate Route Frequency Ordered Stop   06/04/14 2300  metroNIDAZOLE (FLAGYL) IVPB 500 mg     500 mg100 mL/hr over 60 Minutes Intravenous Every 8 hours 06/04/14 1524     06/04/14 1500  ceFEPIme (MAXIPIME) 1 g in dextrose 5 % 50 mL IVPB  Status:  Discontinued     1 g100 mL/hr over 30 Minutes Intravenous Every 24 hours 06/04/14 1454 06/06/14 0913   06/04/14 1130  vancomycin (VANCOCIN) 1,500 mg in sodium chloride 0.9 % 500 mL IVPB     1,500 mg250 mL/hr over 120 Minutes Intravenous  Once 06/04/14 1124 06/04/14 1448   06/04/14 1130  ceFEPIme (MAXIPIME) 1 g in dextrose 5 % 50 mL IVPB  Status:  Discontinued     1 g100 mL/hr over 30 Minutes Intravenous  Once 06/04/14 1124 06/04/14 1546   06/04/14 1130  metroNIDAZOLE (FLAGYL) IVPB 500 mg     500 mg100 mL/hr over 60 Minutes Intravenous  Once 06/04/14 1124 06/04/14 1617      Assessment: 81 yom presented to the ED with upper GIB. Empiric cefepime and flagyl for possible aspiration PNA. Pt improving clinically, no fever, WBC wnl, PCT trending down. Resp cx shows few GPC in pairs. Pt is on day #3 of abx. Pharmacy to change cefepime to ceftriaxone.  Goal of Therapy:  Resolution of infection  Plan:  D/C cefepime Start ceftriaxone 1g IV  Q24 Continue Flagyl Monitor clinical picture F/U abx LOT  Tadhg Eskew J 06/06/2014,9:19 AM

## 2014-06-06 NOTE — Progress Notes (Signed)
eLink Physician-Brief Progress Note Patient Name: Marvin ShengDonald G Leon DOB: 1932/10/13 MRN: [id number]   Date of Service  06/06/2014  HPI/Events of Note    eICU Interventions  PRN haldol     Intervention Category Minor Interventions: Agitation / anxiety - evaluation and management  Billy FischerDavid Deshante Cassell 06/06/2014, 5:01 PM

## 2014-06-06 NOTE — Progress Notes (Signed)
Patient ID: Marvin Leon, male   DOB: 03-04-1933, 81 y.o.   MRN: [id number]    SUBJECTIVE: Patient remains intubated/sedated. EGD 1/19 LA class D esophagitis. HGB 8.1  Troponin peaked 1.5. HR much improved. Got lasix today and 2l out. CVP 7-8. SBP 70s ECG with anteroseptal Qs.   Scheduled Meds: . sodium chloride   Intravenous Once  . antiseptic oral rinse  7 mL Mouth Rinse QID  . cefTRIAXone (ROCEPHIN)  IV  1 g Intravenous Q24H  . chlorhexidine  15 mL Mouth Rinse BID  . [START ON 06/07/2014] clopidogrel  75 mg Oral Daily  . insulin aspart  2-6 Units Subcutaneous 6 times per day  . metoprolol  5 mg Intravenous 4 times per day  . metronidazole  500 mg Intravenous Q8H  . pantoprazole (PROTONIX) IV  40 mg Intravenous Q12H  . sodium chloride  1,000 mL Intravenous Once   Continuous Infusions: . dexmedetomidine 0.4 mcg/kg/hr (06/06/14 1503)   PRN Meds:.acetaminophen, fentaNYL, LORazepam, metoprolol    Filed Vitals:   06/06/14 1200 06/06/14 1300 06/06/14 1400 06/06/14 1500  BP: 122/71 80/52 88/49  106/53  Pulse: 114 100 91 104  Temp: 100 F (37.8 C) 100 F (37.8 C) 99.8 F (37.7 C) 99.6 F (37.6 C)  TempSrc:      Resp: Weight:      SpO2: 98% 96% 98% 96%    Intake/Output Summary (Last 24 hours) at 06/06/14 1610 Last data filed at 06/06/14 1552  Gross per 24 hour  Intake 2271.38 ml  Output   2090 ml  Net 181.38 ml    LABS: Basic Metabolic Panel:  Recent Labs  29/56/21 0300 06/05/14 1700 06/06/14 0420  NA 141 141 143  K 3.7 3.7 3.6  CL 114* 115* 116*  CO2 GLUCOSE 161* 106* 110*  BUN 32* 27* 24*  CREATININE 1.48* 1.48* 1.42*  CALCIUM 7.8* 8.1* 8.3*  MG 1.4* 1.7  --   PHOS 1.6* 2.4  --    Liver Function Tests:  Recent Labs  06/04/14 1400 06/06/14 0420  AST 33 26  ALT 12 14  ALKPHOS 41 38*  BILITOT 0.8 0.6  PROT 4.5* 3.9*  ALBUMIN 2.6* 2.1*    Recent Labs  06/04/14 2342  LIPASE 15  AMYLASE 143*   CBC:  Recent Labs  06/04/14 1149  06/04/14 1400  06/05/14 0300  06/06/14 0420 06/06/14 0900  WBC 4.3  --  3.4*  < > 6.3  --  7.6  --   NEUTROABS 3.3  --  2.5  --   --   --   --   --   HGB 13.0  < > 11.9*  < > 9.0*  < > 8.5* 8.1*  HCT 39.2  < > 36.2*  < > 26.3*  < > 25.5* 24.2*  MCV 97.0  --  96.3  < > 92.0  --  94.8  --   PLT 139*  --  130*  < > 101*  --  99*  --   < > = values in this interval not displayed. Cardiac Enzymes:  Recent Labs  06/05/14 1100 06/06/14 0008 06/06/14 1140  TROPONINI 0.56* 1.43* 1.51*   BNP: Invalid input(s): POCBNP D-Dimer: No results for input(s): DDIMER in the last 72 hours. Hemoglobin A1C: No results for input(s): HGBA1C in the last 72 hours. Fasting Lipid Panel: No results for input(s): CHOL, HDL, LDLCALC, TRIG, CHOLHDL, LDLDIRECT in the last 72  hours. Thyroid Function Tests: No results for input(s): TSH, T4TOTAL, T3FREE, THYROIDAB in the last 72 hours.  Invalid input(s): FREET3 Anemia Panel: No results for input(s): VITAMINB12, FOLATE, FERRITIN, TIBC, IRON, RETICCTPCT in the last 72 hours.  RADIOLOGY: Ct Head Wo Contrast  06/04/2014   CLINICAL DATA:  Initial encounter for patient found unresponsive  EXAM: CT HEAD WITHOUT CONTRAST  TECHNIQUE: Contiguous axial images were obtained from the base of the skull through the vertex without intravenous contrast.  COMPARISON:  Outside MRI from 07/20/2009.  MRI from 05/04/2007.  FINDINGS: There is no evidence for acute hemorrhage, hydrocephalus, mass lesion, or abnormal extra-axial fluid collection. Diffuse loss of parenchymal volume is consistent with atrophy. No definite CT evidence for acute infarction. The visualized paranasal sinuses and mastoid air cells are clear.  IMPRESSION: Mild atrophy.  No acute intracranial abnormality.   Electronically Signed   By: Kennith CenterEric  Mansell M.D.   On: 06/04/2014 16:03   Dg Chest Port 1 View  06/06/2014   CLINICAL DATA:  Shortness of breath  EXAM: PORTABLE CHEST - 1 VIEW  COMPARISON:   06/04/2014  FINDINGS: Endotracheal tube tip at the clavicular heads. A left IJ catheter remains at the SVC level. Gastric suction tube has been removed.  Increasing diffuse interstitial opacities with a hazy appearance of the lower chest, likely pleural fluid. No pneumothorax. Heart size is normal. Unchanged aortic and hilar contours. Biapical pleural/extrapleural thickening which is symmetric.  IMPRESSION: 1. Endotracheal tube and left IJ catheter remain in good position. 2. Interval development of pulmonary edema and small pleural effusions.   Electronically Signed   By: Tiburcio PeaJonathan  Watts M.D.   On: 06/06/2014 04:56   Dg Chest Port 1 View  06/04/2014   CLINICAL DATA:  Acute respiratory failure. Upper GI bleed. New endotracheal tube and central line placement.  EXAM: PORTABLE CHEST - 1 VIEW 1:25 p.m.  COMPARISON:  06/04/2014 at 11:08 a.m.  FINDINGS: Endotracheal tube is in good position at the level of the thoracic inlet. New left jugular vein catheter tip is in the superior vena cava above the cavoatrial junction in good position. New NG tube tip is below the diaphragm.  The heart size and pulmonary vascularity are normal. Right lung is clear. Slight haziness in the left lung could represent unilateral pulmonary edema. Chronic bilateral apical pleural thickening.  IMPRESSION: Tubes and lines in good position. Hazy infiltrate in the left lung has improved and probably represents unilateral pulmonary edema.   Electronically Signed   By: Geanie CooleyJim  Maxwell M.D.   On: 06/04/2014 15:13   Dg Chest Portable 1 View  06/04/2014   CLINICAL DATA:  Intubation.  EXAM: PORTABLE CHEST - 1 VIEW  COMPARISON:  02/16/2014.  FINDINGS: Endotracheal tube noted with its tip 1.3 cm above the carina. Diffuse left lung infiltrate noted consistent with pneumonia. Right lung is clear. Heart size is normal. No acute bony abnormality.  IMPRESSION: 1. Endotracheal tube noted with its tip 1.3 cm above the carina. 2. Diffuse left lung pulmonary  infiltrate.   Electronically Signed   By: Maisie Fushomas  Register   On: 06/04/2014 11:32   Dg Abd Portable 1v  06/04/2014   CLINICAL DATA:  Gastrointestinal bleeding  EXAM: PORTABLE ABDOMEN - 1 VIEW  COMPARISON:  None.  FINDINGS: There is a nasogastric tube with tip over the gastric body. A pelvic thermistor is also noted.  Bowel gas pattern is nonobstructive.  A right iliac artery stent is noted.  IMPRESSION: Nasogastric tube tip over the gastric  body.   Electronically Signed   By: Tiburcio Pea M.D.   On: 06/04/2014 23:54    PHYSICAL EXAM General: Intubated/sedated Neck: No JVD, no thyromegaly or thyroid nodule.  Lungs: Dependent crackles.  CV: Nondisplaced PMI.  Heart regularS1/S2, no S3/S4, no murmur.  No peripheral edema.   Abdomen: Soft, nontender, no hepatosplenomegaly, no distention.  Neurologic: awakens to stimuli  Extremities: No clubbing or cyanosis.   TELEMETRY: Reviewed telemetry pt in NSR 80s ASSESSMENT AND PLAN: 81 yo with CAD s/p PAD s/p recent DES to Ri iliac in 10/15 presented with upper GI bleeding/coffee grounds emesis and altered mental status.  He was intubated for airway protection.  He has possible left-sided aspiration PNA.   1. Tachycardia: HR has been fast at times, now much improved. Likely a response to his medical illness.  2. Upper GI bleeding: Coffee grounds emesis. EGD with severe esophagitis.  Hemoglobin 8.2 Off ASA and Plavix.  Recent PCI was a peripheral intervention (iliac stent 02/2014), lower risk of problems with stent off antiplatelet agents.  3. Elevated troponin/CAD: Mild elevation to 1.5  Likely demand ischemia. Last cath 2010 with patent stents. Not candidate for ASA/heparin/b-blocker at this time. Troponin level actually lower than I might expect given degree of hemodynamic stress. Can consider lexiscan myoview prior to d/c but patient not cath candidate due to severe GIB. Echo pending.  4. ID: Suspect aspiration PNA.  Febrile with elevated PCT.  Abx per  primary service.  5. Hypotension: likely overdiuresed. CVP 7-8. Will give 250 NS back.  Arvilla Meres MD 06/06/2014 4:10 PM

## 2014-06-07 ENCOUNTER — Inpatient Hospital Stay (HOSPITAL_COMMUNITY): Payer: Medicare Other

## 2014-06-07 DIAGNOSIS — G9340 Encephalopathy, unspecified: Secondary | ICD-10-CM | POA: Diagnosis present

## 2014-06-07 DIAGNOSIS — I420 Dilated cardiomyopathy: Secondary | ICD-10-CM

## 2014-06-07 LAB — CSF CELL COUNT WITH DIFFERENTIAL
RBC Count, CSF: 12 /mm3 — ABNORMAL HIGH
RBC Count, CSF: 1395 /mm3 — ABNORMAL HIGH
Tube #: 1
Tube #: 4
WBC, CSF: 0 /mm3 (ref 0–5)
WBC, CSF: 3 /mm3 (ref 0–5)

## 2014-06-07 LAB — BASIC METABOLIC PANEL
ANION GAP: 9 (ref 5–15)
ANION GAP: 14 (ref 5–15)
BUN: 25 mg/dL — AB (ref 6–23)
BUN: 26 mg/dL — AB (ref 6–23)
CALCIUM: 8.7 mg/dL (ref 8.4–10.5)
CALCIUM: 8.9 mg/dL (ref 8.4–10.5)
CHLORIDE: 112 meq/L (ref 96–112)
CO2: 20 mmol/L (ref 19–32)
CO2: 19 mmol/L (ref 19–32)
Chloride: 115 mEq/L — ABNORMAL HIGH (ref 96–112)
Creatinine, Ser: 1.49 mg/dL — ABNORMAL HIGH (ref 0.50–1.35)
Creatinine, Ser: 1.56 mg/dL — ABNORMAL HIGH (ref 0.50–1.35)
GFR calc Af Amer: 50 mL/min — ABNORMAL LOW (ref >90)
GFR calc Af Amer: 47 mL/min — ABNORMAL LOW (ref >90)
GFR calc non Af Amer: 43 mL/min — ABNORMAL LOW (ref >90)
GFR calc non Af Amer: 41 mL/min — ABNORMAL LOW (ref >90)
Glucose, Bld: 138 mg/dL — ABNORMAL HIGH (ref 70–99)
Glucose, Bld: 144 mg/dL — ABNORMAL HIGH (ref 70–99)
POTASSIUM: 3.6 mmol/L (ref 3.5–5.1)
Potassium: 2.9 mmol/L — ABNORMAL LOW (ref 3.5–5.1)
SODIUM: 145 mmol/L (ref 135–145)
Sodium: 144 mmol/L (ref 135–145)

## 2014-06-07 LAB — GLUCOSE, CAPILLARY
GLUCOSE-CAPILLARY: 105 mg/dL — AB (ref 70–99)
GLUCOSE-CAPILLARY: 122 mg/dL — AB (ref 70–99)
Glucose-Capillary: 128 mg/dL — ABNORMAL HIGH (ref 70–99)
Glucose-Capillary: 116 mg/dL — ABNORMAL HIGH (ref 70–99)
Glucose-Capillary: 129 mg/dL — ABNORMAL HIGH (ref 70–99)
Glucose-Capillary: 151 mg/dL — ABNORMAL HIGH (ref 70–99)

## 2014-06-07 LAB — HEMOGLOBIN AND HEMATOCRIT, BLOOD
HCT: 24.3 % — ABNORMAL LOW (ref 39.0–52.0)
Hemoglobin: 8.1 g/dL — ABNORMAL LOW (ref 13.0–17.0)

## 2014-06-07 LAB — CARBOXYHEMOGLOBIN
CARBOXYHEMOGLOBIN: 0.7 % (ref 0.5–1.5)
Methemoglobin: 1 % (ref 0.0–1.5)
O2 Saturation: 56.8 %
TOTAL HEMOGLOBIN: 16.2 g/dL (ref 13.5–18.0)

## 2014-06-07 LAB — TROPONIN I
TROPONIN I: 1.14 ng/mL — AB (ref <0.031)
TROPONIN I: 1 ng/mL — AB (ref <0.031)

## 2014-06-07 LAB — CULTURE, RESPIRATORY W GRAM STAIN

## 2014-06-07 LAB — CULTURE, RESPIRATORY

## 2014-06-07 LAB — PROTEIN AND GLUCOSE, CSF
Glucose, CSF: 78 mg/dL — ABNORMAL HIGH (ref 43–76)
TOTAL PROTEIN, CSF: 83 mg/dL — AB (ref 15–45)

## 2014-06-07 LAB — MRSA PCR SCREENING: MRSA by PCR: NEGATIVE

## 2014-06-07 LAB — CBC
HCT: 24.4 % — ABNORMAL LOW (ref 39.0–52.0)
Hemoglobin: 8.3 g/dL — ABNORMAL LOW (ref 13.0–17.0)
MCH: 31.3 pg (ref 26.0–34.0)
MCHC: 34 g/dL (ref 30.0–36.0)
MCV: 92.1 fL (ref 78.0–100.0)
PLATELETS: 112 10*3/uL — AB (ref 150–400)
RBC: 2.65 MIL/uL — ABNORMAL LOW (ref 4.22–5.81)
RDW: 14.5 % (ref 11.5–15.5)
WBC: 11 10*3/uL — AB (ref 4.0–10.5)

## 2014-06-07 LAB — MAGNESIUM: Magnesium: 1.6 mg/dL (ref 1.5–2.5)

## 2014-06-07 LAB — PHOSPHORUS: PHOSPHORUS: 2.7 mg/dL (ref 2.3–4.6)

## 2014-06-07 LAB — GRAM STAIN: Special Requests: NORMAL

## 2014-06-07 MED ORDER — POTASSIUM CHLORIDE 10 MEQ/50ML IV SOLN
10.0000 meq | INTRAVENOUS | Status: AC
  Administered 2014-06-07 (×5): 10 meq via INTRAVENOUS
  Filled 2014-06-07 (×3): qty 50

## 2014-06-07 MED ORDER — DEXMEDETOMIDINE HCL IN NACL 400 MCG/100ML IV SOLN
0.4000 ug/kg/h | INTRAVENOUS | Status: DC
  Administered 2014-06-07: 1.2 ug/kg/h via INTRAVENOUS
  Administered 2014-06-07: 0.6 ug/kg/h via INTRAVENOUS
  Administered 2014-06-07 – 2014-06-08 (×4): 1.2 ug/kg/h via INTRAVENOUS
  Administered 2014-06-09: 0.8 ug/kg/h via INTRAVENOUS
  Administered 2014-06-09: 1.2 ug/kg/h via INTRAVENOUS
  Administered 2014-06-09: 0.6 ug/kg/h via INTRAVENOUS
  Administered 2014-06-09 – 2014-06-11 (×5): 0.8 ug/kg/h via INTRAVENOUS
  Filled 2014-06-07 (×6): qty 100
  Filled 2014-06-07: qty 300
  Filled 2014-06-07 (×6): qty 100

## 2014-06-07 MED ORDER — METRONIDAZOLE IN NACL 5-0.79 MG/ML-% IV SOLN
500.0000 mg | Freq: Three times a day (TID) | INTRAVENOUS | Status: DC
  Administered 2014-06-07 – 2014-06-09 (×6): 500 mg via INTRAVENOUS
  Filled 2014-06-07 (×8): qty 100

## 2014-06-07 MED ORDER — PRO-STAT SUGAR FREE PO LIQD
30.0000 mL | Freq: Two times a day (BID) | ORAL | Status: AC
  Administered 2014-06-07 – 2014-06-08 (×2): 30 mL
  Filled 2014-06-07 (×2): qty 30

## 2014-06-07 MED ORDER — FUROSEMIDE 10 MG/ML IJ SOLN
40.0000 mg | Freq: Once | INTRAMUSCULAR | Status: AC
  Administered 2014-06-07: 40 mg via INTRAVENOUS
  Filled 2014-06-07: qty 4

## 2014-06-07 MED ORDER — FENTANYL CITRATE 0.05 MG/ML IJ SOLN
INTRAMUSCULAR | Status: AC
  Administered 2014-06-07: 15:00:00
  Filled 2014-06-07: qty 2

## 2014-06-07 MED ORDER — VITAL AF 1.2 CAL PO LIQD
1000.0000 mL | ORAL | Status: DC
  Administered 2014-06-07 – 2014-06-12 (×6): 1000 mL
  Filled 2014-06-07 (×14): qty 1000

## 2014-06-07 MED ORDER — VITAL HIGH PROTEIN PO LIQD
1000.0000 mL | ORAL | Status: DC
  Administered 2014-06-07: 1000 mL
  Filled 2014-06-07 (×2): qty 1000

## 2014-06-07 MED ORDER — ACETAMINOPHEN 160 MG/5ML PO SOLN
650.0000 mg | ORAL | Status: DC | PRN
  Administered 2014-06-08 – 2014-06-20 (×5): 650 mg via ORAL
  Filled 2014-06-07 (×5): qty 20.3

## 2014-06-07 MED ORDER — MIDAZOLAM HCL 2 MG/2ML IJ SOLN
INTRAMUSCULAR | Status: AC
Start: 2014-06-07 — End: 2014-06-07
  Administered 2014-06-07: 15:00:00
  Filled 2014-06-07: qty 2

## 2014-06-07 NOTE — Progress Notes (Signed)
Patient ID: Marvin Leon, male   DOB: 07/19/1932, 81 y.o.   MRN: [id number]    SUBJECTIVE: Patient remains intubated/sedated. EGD 1/19 LA class D esophagitis. Hemoglobin stable.  Troponin peaked 1.5. Remains in sinus tachycardia with HR 110s. CVP 5.   Echo (1/20) with EF 25%, wall motion abnormality pattern not classic for Takotsubo cardiomyopathy but worse function towards apex.  Normal RV.   Scheduled Meds: . antiseptic oral rinse  7 mL Mouth Rinse QID  . cefTRIAXone (ROCEPHIN)  IV  1 g Intravenous Q24H  . chlorhexidine  15 mL Mouth Rinse BID  . feeding supplement (VITAL HIGH PROTEIN)  1,000 mL Per Tube Q24H  . insulin aspart  2-6 Units Subcutaneous 6 times per day  . metoprolol  5 mg Intravenous 4 times per day  . metronidazole  500 mg Intravenous Q8H  . pantoprazole (PROTONIX) IV  40 mg Intravenous Q12H  . sodium chloride  1,000 mL Intravenous Once   Continuous Infusions: . dexmedetomidine 0.6 mcg/kg/hr (06/07/14 1017)   PRN Meds:.acetaminophen, fentaNYL, haloperidol lactate, LORazepam, metoprolol    Filed Vitals:   06/07/14 0829 06/07/14 0900 06/07/14 1000 06/07/14 1031  BP: 103/61 116/62 146/82 146/82  Pulse: 101 81  144  Temp:  99.4 F (37.4 C) 99.6 F (37.6 C)   TempSrc:      Resp: 12 15 25  33  Weight:      SpO2: 100% 99%      Intake/Output Summary (Last 24 hours) at 06/07/14 1042 Last data filed at 06/07/14 1000  Gross per 24 hour  Intake 998.62 ml  Output   3640 ml  Net -2641.38 ml    LABS: Basic Metabolic Panel:  Recent Labs  40/98/1101/19/16 1700  06/06/14 1700 06/07/14 0450  NA 141  < > 142 144  K 3.7  < > 3.9 3.6  CL 115*  < > 115* 115*  CO2 22  < > 21 20  GLUCOSE 106*  < > 148* 138*  BUN 27*  < > 24* 25*  CREATININE 1.48*  < > 1.65* 1.49*  CALCIUM 8.1*  < > 8.4 8.7  MG 1.7  --   --  1.6  PHOS 2.4  --   --  2.7  < > = values in this interval not displayed. Liver Function Tests:  Recent Labs  06/04/14 1400 06/06/14 0420  AST 33 26  ALT 12  14  ALKPHOS 41 38*  BILITOT 0.8 0.6  PROT 4.5* 3.9*  ALBUMIN 2.6* 2.1*    Recent Labs  06/04/14 2342  LIPASE 15  AMYLASE 143*   CBC:  Recent Labs  06/04/14 1149  06/04/14 1400  06/06/14 0420  06/07/14 0029 06/07/14 0450  WBC 4.3  --  3.4*  < > 7.6  --   --  11.0*  NEUTROABS 3.3  --  2.5  --   --   --   --   --   HGB 13.0  < > 11.9*  < > 8.5*  < > 8.1* 8.3*  HCT 39.2  < > 36.2*  < > 25.5*  < > 24.3* 24.4*  MCV 97.0  --  96.3  < > 94.8  --   --  92.1  PLT 139*  --  130*  < > 99*  --   --  112*  < > = values in this interval not displayed. Cardiac Enzymes:  Recent Labs  06/06/14 1140 06/07/14 0008 06/07/14 0450  TROPONINI 1.51* 1.14* 1.00*  BNP: Invalid input(s): POCBNP D-Dimer: No results for input(s): DDIMER in the last 72 hours. Hemoglobin A1C: No results for input(s): HGBA1C in the last 72 hours. Fasting Lipid Panel: No results for input(s): CHOL, HDL, LDLCALC, TRIG, CHOLHDL, LDLDIRECT in the last 72 hours. Thyroid Function Tests: No results for input(s): TSH, T4TOTAL, T3FREE, THYROIDAB in the last 72 hours.  Invalid input(s): FREET3 Anemia Panel: No results for input(s): VITAMINB12, FOLATE, FERRITIN, TIBC, IRON, RETICCTPCT in the last 72 hours.  RADIOLOGY: Ct Head Wo Contrast  06/04/2014   CLINICAL DATA:  Initial encounter for patient found unresponsive  EXAM: CT HEAD WITHOUT CONTRAST  TECHNIQUE: Contiguous axial images were obtained from the base of the skull through the vertex without intravenous contrast.  COMPARISON:  Outside MRI from 07/20/2009.  MRI from 05/04/2007.  FINDINGS: There is no evidence for acute hemorrhage, hydrocephalus, mass lesion, or abnormal extra-axial fluid collection. Diffuse loss of parenchymal volume is consistent with atrophy. No definite CT evidence for acute infarction. The visualized paranasal sinuses and mastoid air cells are clear.  IMPRESSION: Mild atrophy.  No acute intracranial abnormality.   Electronically Signed   By:  Kennith Center M.D.   On: 06/04/2014 16:03   Dg Chest Port 1 View  06/06/2014   CLINICAL DATA:  Shortness of breath  EXAM: PORTABLE CHEST - 1 VIEW  COMPARISON:  06/04/2014  FINDINGS: Endotracheal tube tip at the clavicular heads. A left IJ catheter remains at the SVC level. Gastric suction tube has been removed.  Increasing diffuse interstitial opacities with a hazy appearance of the lower chest, likely pleural fluid. No pneumothorax. Heart size is normal. Unchanged aortic and hilar contours. Biapical pleural/extrapleural thickening which is symmetric.  IMPRESSION: 1. Endotracheal tube and left IJ catheter remain in good position. 2. Interval development of pulmonary edema and small pleural effusions.   Electronically Signed   By: Tiburcio Pea M.D.   On: 06/06/2014 04:56   Dg Chest Port 1 View  06/04/2014   CLINICAL DATA:  Acute respiratory failure. Upper GI bleed. New endotracheal tube and central line placement.  EXAM: PORTABLE CHEST - 1 VIEW 1:25 p.m.  COMPARISON:  06/04/2014 at 11:08 a.m.  FINDINGS: Endotracheal tube is in good position at the level of the thoracic inlet. New left jugular vein catheter tip is in the superior vena cava above the cavoatrial junction in good position. New NG tube tip is below the diaphragm.  The heart size and pulmonary vascularity are normal. Right lung is clear. Slight haziness in the left lung could represent unilateral pulmonary edema. Chronic bilateral apical pleural thickening.  IMPRESSION: Tubes and lines in good position. Hazy infiltrate in the left lung has improved and probably represents unilateral pulmonary edema.   Electronically Signed   By: Geanie Cooley M.D.   On: 06/04/2014 15:13   Dg Chest Portable 1 View  06/04/2014   CLINICAL DATA:  Intubation.  EXAM: PORTABLE CHEST - 1 VIEW  COMPARISON:  02/16/2014.  FINDINGS: Endotracheal tube noted with its tip 1.3 cm above the carina. Diffuse left lung infiltrate noted consistent with pneumonia. Right lung is  clear. Heart size is normal. No acute bony abnormality.  IMPRESSION: 1. Endotracheal tube noted with its tip 1.3 cm above the carina. 2. Diffuse left lung pulmonary infiltrate.   Electronically Signed   By: Maisie Fus  Register   On: 06/04/2014 11:32   Dg Abd Portable 1v  06/04/2014   CLINICAL DATA:  Gastrointestinal bleeding  EXAM: PORTABLE ABDOMEN - 1 VIEW  COMPARISON:  None.  FINDINGS: There is a nasogastric tube with tip over the gastric body. A pelvic thermistor is also noted.  Bowel gas pattern is nonobstructive.  A right iliac artery stent is noted.  IMPRESSION: Nasogastric tube tip over the gastric body.   Electronically Signed   By: Tiburcio Pea M.D.   On: 06/04/2014 23:54    PHYSICAL EXAM General: Intubated/sedated Neck: No JVD, no thyromegaly or thyroid nodule.  Lungs: Dependent crackles.  CV: Nondisplaced PMI.  Heart regular S1/S2, no S3/S4, no murmur.  1+ ankle edema.   Abdomen: Soft, nontender, no hepatosplenomegaly, no distention.  Neurologic: awakens to stimuli  Extremities: No clubbing or cyanosis.   TELEMETRY: Reviewed telemetry pt in sinus tachy 110s  ASSESSMENT AND PLAN: 81 yo with CAD s/p PAD s/p recent DES to Ri iliac in 10/15 presented with upper GI bleeding/coffee grounds emesis and altered mental status.  He was intubated for airway protection.  He has suspected left-sided aspiration PNA.   1. Cardiomyopathy: EF 25% on echo.  Findings not classic for Takotsubo but function worse towards apex.  Could be stress (Takotsubo-type) CMP but cannot rule out CAD as cause.  However, troponin only peaked at 1.5 so doubt acute MI here. CVP 5, patient does not appear to be volume overloaded.  - Send co-ox today to get an idea of cardiac output. Sinus tachy could be reflective of low output.  - If BP remains stable and co-ox ok, would add low dose Coreg.  If BP remains stable and co-ox low, would add low dose hydralazine/isordil first for afterload reduction.  2. Upper GI bleeding:  Coffee grounds emesis. EGD with severe esophagitis.  Hemoglobin stable.  Now off ASA and Plavix.  Recent PCI was a peripheral intervention (iliac stent 02/2014), lower risk of problems with stent off antiplatelet agents.  From notes it appears that GI ok'd resumption of Plavix.  Can start this and hold ASA 81 for a couple of weeks before resuming.  3. Elevated troponin/CAD: Mild elevation to 1.5.  Probably demand ischemia with hypotension/hypoxemia. I suspect his cardiomyopathy is either a stress cardiomyopathy from this event or ischemic cardiomyopathy that preceded this event (doubt true ACS with plaque rupture this admission).  Last cath 2010 with patent stents. Plan to start back Plavix, as above.  Would consider cath versus Cardiolite prior to discharge.  Would not use heparin at this time.   4. ID: Suspect aspiration PNA.  Febrile with elevated PCT.  Abx per primary service.   Marca Ancona MD 06/07/2014 10:42 AM

## 2014-06-07 NOTE — Procedures (Signed)
Lumbar Puncture Procedure Note  Pre-operative Diagnosis: altered mental status, fever  Post-operative Diagnosis: same  Indications: Diagnostic  Procedure Details   Consent: Informed consent was obtained. Risks of the procedure were discussed including: infection, bleeding, pain and headache.  The patient was positioned under sterile conditions. Betadine solution and sterile drapes were utilized. A spinal needle was inserted at the L1 - L2 interspace.  Spinal fluid was obtained and sent to the laboratory.  Findings 8mL of clear spinal fluid was obtained.   Complications:  None; patient tolerated the procedure well.        Condition: stable  Plan Bed rest for 2 hours.   Oretha MilchALVA,RAKESH V. MD

## 2014-06-07 NOTE — Progress Notes (Signed)
ANTIBIOTIC CONSULT NOTE - FOLLOW UP  Pharmacy Consult for Ceftriaxone Indication: rule out PNA  Allergies  Allergen Reactions  . Darvocet [Propoxyphene N-Acetaminophen] Nausea And Vomiting  . Penicillins Nausea And Vomiting    Patient Measurements: Weight: 174 lb 13.2 oz (79.3 kg)  Vital Signs: Temp: 99.4 F (37.4 C) (01/21 0900) BP: 116/62 mmHg (01/21 0900) Pulse Rate: 81 (01/21 0900) Intake/Output from previous day: 01/20 0701 - 01/21 0700 In: 1169.8 [I.V.:669.8; IV Piggyback:500] Out: 2690 [Urine:2690] Intake/Output from this shift: Total I/O In: -  Out: 700 [Urine:700]  Labs:  Recent Labs  06/05/14 0300  06/06/14 0420  06/06/14 1550 06/06/14 1700 06/07/14 0029 06/07/14 0450  WBC 6.3  --  7.6  --   --   --   --  11.0*  HGB 9.0*  < > 8.5*  < > 8.5*  --  8.1* 8.3*  PLT 101*  --  99*  --   --   --   --  112*  CREATININE 1.48*  < > 1.42*  --   --  1.65*  --  1.49*  < > = values in this interval not displayed. Estimated Creatinine Clearance: 40.2 mL/min (by C-G formula based on Cr of 1.49). No results for input(s): VANCOTROUGH, VANCOPEAK, VANCORANDOM, GENTTROUGH, GENTPEAK, GENTRANDOM, TOBRATROUGH, TOBRAPEAK, TOBRARND, AMIKACINPEAK, AMIKACINTROU, AMIKACIN in the last 72 hours.   Microbiology: Recent Results (from the past 720 hour(s))  Blood culture (routine x 2)     Status: None (Preliminary result)   Collection Time: 06/04/14 11:50 AM  Result Value Ref Range Status   Specimen Description BLOOD HAND LEFT  Final   Special Requests BOTTLES DRAWN AEROBIC AND ANAEROBIC 2CC  Final   Culture   Final           BLOOD CULTURE RECEIVED NO GROWTH TO DATE CULTURE WILL BE HELD FOR 5 DAYS BEFORE ISSUING A FINAL NEGATIVE REPORT Performed at [facility]    Report Status PENDING  Incomplete  Blood culture (routine x 2)     Status: None (Preliminary result)   Collection Time: 06/04/14 11:54 AM  Result Value Ref Range Status   Specimen Description BLOOD LEFT FOREARM   Final   Special Requests BOTTLES DRAWN AEROBIC ONLY 2CC  Final   Culture   Final           BLOOD CULTURE RECEIVED NO GROWTH TO DATE CULTURE WILL BE HELD FOR 5 DAYS BEFORE ISSUING A FINAL NEGATIVE REPORT Performed at [facility]    Report Status PENDING  Incomplete  Culture, blood (routine x 2)     Status: None (Preliminary result)   Collection Time: 06/04/14  1:50 PM  Result Value Ref Range Status   Specimen Description BLOOD LEFT ANTECUBITAL  Final   Special Requests BOTTLES DRAWN AEROBIC AND ANAEROBIC  Final   Culture   Final           BLOOD CULTURE RECEIVED NO GROWTH TO DATE CULTURE WILL BE HELD FOR 5 DAYS BEFORE ISSUING A FINAL NEGATIVE REPORT Note: Culture results may be compromised due to an excessive volume of blood received in culture bottles. Performed at [facility]    Report Status PENDING  Incomplete  Culture, blood (routine x 2)     Status: None (Preliminary result)   Collection Time: 06/04/14  2:00 PM  Result Value Ref Range Status   Specimen Description BLOOD LEFT HAND  Final   Special Requests BOTTLES DRAWN AEROBIC AND ANAEROBIC  Final   Culture   Final           BLOOD CULTURE RECEIVED NO GROWTH TO DATE CULTURE WILL BE HELD FOR 5 DAYS BEFORE ISSUING A FINAL NEGATIVE REPORT Performed at [facility]    Report Status PENDING  Incomplete  MRSA PCR Screening     Status: None   Collection Time: 06/04/14  4:20 PM  Result Value Ref Range Status   MRSA by PCR NEGATIVE NEGATIVE Final    Comment:        The GeneXpert MRSA Assay (FDA approved for NASAL specimens only), is one component of a comprehensive MRSA colonization surveillance program. It is not intended to diagnose MRSA infection nor to guide or monitor treatment for MRSA infections.   Urine culture     Status: None   Collection Time: 06/04/14  4:46 PM  Result Value Ref Range Status   Specimen Description URINE, CATHETERIZED  Final   Special Requests Normal  Final    Colony Count NO GROWTH Performed at [facility]   Final   Culture NO GROWTH Performed at [facility]   Final   Report Status 06/05/2014 FINAL  Final  Culture, respiratory (NON-Expectorated)     Status: None   Collection Time: 06/04/14  4:46 PM  Result Value Ref Range Status   Specimen Description TRACHEAL ASPIRATE  Final   Special Requests NONE  Final   Gram Stain   Final    FEW WBC PRESENT, PREDOMINANTLY MONONUCLEAR RARE SQUAMOUS EPITHELIAL CELLS PRESENT FEW GRAM POSITIVE COCCI IN PAIRS IN CLUSTERS MODERATE YEAST Performed at [facility]    Culture   Final    Non-Pathogenic Oropharyngeal-type Flora Isolated. Performed at [facility]    Report Status 06/07/2014 FINAL  Final    Anti-infectives    Start     Dose/Rate Route Frequency Ordered Stop   06/06/14 1500  cefTRIAXone (ROCEPHIN) 1 g in dextrose 5 % 50 mL IVPB - Premix     1 g100 mL/hr over 30 Minutes Intravenous Every 24 hours 06/06/14 0926     06/04/14 2300  metroNIDAZOLE (FLAGYL) IVPB 500 mg  Status:  Discontinued     500 mg100 mL/hr over 60 Minutes Intravenous Every 8 hours 06/04/14 1524 06/07/14 0656   06/04/14 1500  ceFEPIme (MAXIPIME) 1 g in dextrose 5 % 50 mL IVPB  Status:  Discontinued     1 g100 mL/hr over 30 Minutes Intravenous Every 24 hours 06/04/14 1454 06/06/14 0913   06/04/14 1130  vancomycin (VANCOCIN) 1,500 mg in sodium chloride 0.9 % 500 mL IVPB     1,500 mg250 mL/hr over 120 Minutes Intravenous  Once 06/04/14 1124 06/04/14 1448   06/04/14 1130  ceFEPIme (MAXIPIME) 1 g in dextrose 5 % 50 mL IVPB  Status:  Discontinued     1 g100 mL/hr over 30 Minutes Intravenous  Once 06/04/14 1124 06/04/14 1546   06/04/14 1130  metroNIDAZOLE (FLAGYL) IVPB 500 mg     500 mg100 mL/hr over 60 Minutes Intravenous  Once 06/04/14 1124 06/04/14 1617      Assessment: 81 yom presented to the ED with upper GIB. Empiric cefepime and flagyl for possible aspiration PNA. Was then deescalated to  ceftriaxone and flagyl.  Still spiking low grade fevers, WBC up to 11.0, PCT trending down. Resp cx shows few GPC in pairs. Pt is on day #4 of abx.  Goal of Therapy:  Resolution of infection  Plan:  Continue flagyl   IV Q8 Continue ceftriaxone 1g IV Q24 for total of 7 days Monitor clinical picture Restart home Toprol when taking PO?  Drury Ardizzone J 06/07/2014,9:49 AM

## 2014-06-07 NOTE — Progress Notes (Signed)
INITIAL NUTRITION ASSESSMENT  DOCUMENTATION CODES Per approved criteria  -Non-severe (moderate) malnutrition in the context of chronic illness   Pt meets criteria for moderate MALNUTRITION in the context of chronic illness as evidenced by mild-moderate depletion of muscle and subcutaneous fat mass.  INTERVENTION:  Initiate TF via OGT with Vital AF 1.2 at 25 ml/h and Prostat 30 ml BID on day 1; on day 2, increase to goal rate of 60 ml/h (1440 ml per day) to provide 1728 kcals, 108 gm protein, 1168 ml free water daily.  NUTRITION DIAGNOSIS: Inadequate oral intake related to inability to eat as evidenced by NPO status.   Goal: Intake to meet >90% of estimated nutrition needs.  Monitor:  TF tolerance/adequacy, weight trend, labs, vent status.  Reason for Assessment: MD Consult for TF initiation and management.  81 y.o. male  Admitting Dx: UGIB (upper gastrointestinal bleed)  ASSESSMENT: Patient presented to the hospital on 1/18 with hematemesis and AMS. Intubated in the ED. EGD on 1/19 showed LA ss D esophagitis.  Discussed patient in ICU rounds today. RD to start TF today. Plans for LP today to r/o meningitis.   Nutrition Focused Physical Exam:  Subcutaneous Fat:  Orbital Region: mild-moderate depletion Upper Arm Region: NA Thoracic and Lumbar Region: NA  Muscle:  Temple Region: severe depletion Clavicle Bone Region: moderate depletion Clavicle and Acromion Bone Region: mild depletion Scapular Bone Region: NA Dorsal Hand: NA Patellar Region: mild depletion Anterior Thigh Region: mild depletion Posterior Calf Region: moderate depletion  Edema: mild  Patient is currently intubated on ventilator support MV: 8.5 L/min Temp (24hrs), Avg:99.4 F (37.4 C), Min:98.7 F (37.1 C), Max:99.8 F (37.7 C)  Propofol: none  Height: Ht Readings from Last 1 Encounters:  03/20/14 5\' 9"  (1.753 m)    Weight: Wt Readings from Last 1 Encounters:  06/07/14 174 lb 13.2 oz (79.3  kg)    Ideal Body Weight: 72.7 kg  % Ideal Body Weight: 109%  Wt Readings from Last 10 Encounters:  06/07/14 174 lb 13.2 oz (79.3 kg)  03/20/14 162 lb 8 oz (73.71 kg)  02/23/14 165 lb 9.1 oz (75.1 kg)  01/29/14 166 lb (75.297 kg)  12/05/13 167 lb 12.8 oz (76.114 kg)  08/31/13 166 lb 9.6 oz (75.569 kg)  05/12/13 164 lb 6.4 oz (74.571 kg)  03/30/13 168 lb (76.204 kg)  02/22/13 165 lb 8 oz (75.07 kg)  02/16/13 160 lb (72.576 kg)    Usual Body Weight: 162 lbs  % Usual Body Weight: 107%  BMI:  Body mass index is 25.81 kg/(m^2).  Estimated Nutritional Needs: Kcal: 1791 Protein: 110-125 gm Fluid: 2 L  Skin: intact  Diet Order: Diet NPO time specified  EDUCATION NEEDS: -Education not appropriate at this time   Intake/Output Summary (Last 24 hours) at 06/07/14 1337 Last data filed at 06/07/14 1200  Gross per 24 hour  Intake 968.62 ml  Output   2990 ml  Net -2021.38 ml    Last BM: PTA   Labs:   Recent Labs Lab 06/05/14 0300 06/05/14 1700 06/06/14 0420 06/06/14 1700 06/07/14 0450  NA 141 141 143 142 144  K 3.7 3.7 3.6 3.9 3.6  CL 114* 115* 116* 115* 115*  CO2 20 22 19 21 20   BUN 32* 27* 24* 24* 25*  CREATININE 1.48* 1.48* 1.42* 1.65* 1.49*  CALCIUM 7.8* 8.1* 8.3* 8.4 8.7  MG 1.4* 1.7  --   --  1.6  PHOS 1.6* 2.4  --   --  2.7  GLUCOSE 161* 106* 110* 148* 138*    CBG (last 3)   Recent Labs  06/07/14 0412 06/07/14 0724 06/07/14 1107  GLUCAP 105* 122* 116*    Scheduled Meds: . antiseptic oral rinse  7 mL Mouth Rinse QID  . cefTRIAXone (ROCEPHIN)  IV  1 g Intravenous Q24H  . chlorhexidine  15 mL Mouth Rinse BID  . feeding supplement (VITAL HIGH PROTEIN)  1,000 mL Per Tube Q24H  . insulin aspart  2-6 Units Subcutaneous 6 times per day  . metoprolol  5 mg Intravenous 4 times per day  . metronidazole  500 mg Intravenous Q8H  . pantoprazole (PROTONIX) IV  40 mg Intravenous Q12H  . sodium chloride  1,000 mL Intravenous Once    Continuous  Infusions: . dexmedetomidine 0.6 mcg/kg/hr (06/07/14 1017)    Past Medical History  Diagnosis Date  . Arthritis   . GERD (gastroesophageal reflux disease)   . Hyperlipidemia   . Hypertension   . Coronary artery disease   . Carotid artery disease   . Peripheral arterial disease     50% ostial right common iliac artery stenosis without claudication  . Emphysema   . Shortness of breath   . Diabetes mellitus without complication     type 2  . Anxiety     Past Surgical History  Procedure Laterality Date  . Colonoscopy    . Cardiac catheterization      3 stents placed  . Cholecystectomy N/A 04/07/2013    Procedure: LAPAROSCOPIC CHOLECYSTECTOMY WITH INTRAOPERATIVE CHOLANGIOGRAM;  Surgeon: Robyne Askew, MD;  Location: [facility] OR;  Service: General;  Laterality: N/A;  . Iliac artery stent Right 02/22/2014    Dr. Allyson Sabal 7 mm x 22 mm  ICast covered stent  . Coronary angioplasty      all 3 heart vessels   . Esophagogastroduodenoscopy Left 06/05/2014    Procedure: ESOPHAGOGASTRODUODENOSCOPY (EGD);  Surgeon: Theda Belfast, MD;  Location: [facility] ENDOSCOPY;  Service: Endoscopy;  Laterality: Left;    Joaquin Courts, RD, LDN, CNSC Pager [phone] After Hours Pager [phone]

## 2014-06-07 NOTE — Progress Notes (Signed)
PULMONARY / CRITICAL CARE MEDICINE   Name: Marvin Leon MRN: [id number] DOB: Sep 19, 1932    ADMISSION DATE:  06/04/2014 CONSULTATION DATE:  06/04/2014  REFERRING MD :  EDP  CHIEF COMPLAINT:  VDRF and upper GI bleed  INITIAL PRESENTATION: 81 year old male hx of arthritis, GERD, HLD, HTN, CAD s/p 3 stents, emphysema, PAD , DMII on daily ASA and plavix since 02/22/14 since DES  iliac stent placement 02/2014, but no NSAID use who presents to the hospital with hematemesis and AMS.  In the ED the patient was noted to not be protecting his airway and was intubated for airway protection.  OGT was placed with 1500 ml of coffee ground black material was noted.  PCCM was called to admit to the ICU.  No recent etoh history. Normal cscope and endoscope 2009.  STUDIES:  1/18 NGT lavaged with only coffee grounds, no active bleeding. 1/18 CT - mild atrophy.  1/18 KUB - normal. 1/19: EGD. LA class D esophagitis. this is the source of the bleeding.   SIGNIFICANT EVENTS: 1/18 UGI bleed. hgb dropped. 1/19: had EGD, weaned off pressor.  1/20 - failed extubation. Off pressors.   SUBJECTIVE: Sedated, intubated. Some agitation overnight requiring haldol + ativan. Is also on precedex and prn fentanyl.  VITAL SIGNS: Temp:  [98.7 F (37.1 C)-100.6 F (38.1 C)] 98.7 F (37.1 C) (01/21 0430) Pulse Rate:  [77-129] 80 (01/21 0500) Resp:  [14-23] 16 (01/21 0500) BP: (73-149)/(43-90) 114/75 mmHg (01/21 0500) SpO2:  [94 %-100 %] 100 % (01/21 0500) FiO2 (%):  [40 %] 40 % (01/21 0330) Weight:  [79.3 kg (174 lb 13.2 oz)] 79.3 kg (174 lb 13.2 oz) (01/21 0435) HEMODYNAMICS: CVP:  [5 mmHg-9 mmHg] 7 mmHg VENTILATOR SETTINGS: Vent Mode:  [-] PRVC FiO2 (%):  [40 %] 40 % Set Rate:  [16 bmp] 16 bmp Vt Set:  [600 mL] 600 mL PEEP:  [5 cmH20] 5 cmH20 Plateau Pressure:  [10 cmH20-16 cmH20] 16 cmH20 INTAKE / OUTPUT:  Intake/Output Summary (Last 24 hours) at 06/07/14 0639 Last data filed at 06/07/14 0500  Gross per  24 hour  Intake 1104.55 ml  Output   2690 ml  Net -1585.45 ml    PHYSICAL EXAMINATION: General:  Intubated, sedated. NAD. Neuro: moves exts. HEENT:  [address]/AT Cardiovascular:  regular, tachy, Nl S1/S2, -M/R/G. Lungs:  CTAB Abdomen:  Soft, NT, ND and +BS. Musculoskeletal:  -edema and -tenderness. Skin:  Intact.  LABS:  CBC  Recent Labs Lab 06/05/14 0300  06/06/14 0420  06/06/14 1550 06/07/14 0029 06/07/14 0450  WBC 6.3  --  7.6  --   --   --  11.0*  HGB 9.0*  < > 8.5*  < > 8.5* 8.1* 8.3*  HCT 26.3*  < > 25.5*  < > 25.3* 24.3* 24.4*  PLT 101*  --  99*  --   --   --  112*  < > = values in this interval not displayed. Coag's  Recent Labs Lab 06/04/14 1149 06/04/14 1400  APTT  --  30  INR 1.28 1.29   BMET  Recent Labs Lab 06/06/14 0420 06/06/14 1700 06/07/14 0450  NA 143 142 144  K 3.6 3.9 3.6  CL 116* 115* 115*  CO2 19 21 20   BUN 24* 24* 25*  CREATININE 1.42* 1.65* 1.49*  GLUCOSE 110* 148* 138*   Electrolytes  Recent Labs Lab 06/05/14 0300 06/05/14 1700 06/06/14 0420 06/06/14 1700 06/07/14 0450  CALCIUM 7.8* 8.1* 8.3* 8.4 8.7  MG 1.4* 1.7  --   --  1.6  PHOS 1.6* 2.4  --   --  2.7   Sepsis Markers  Recent Labs Lab 06/04/14 1150 06/04/14 1400 06/04/14 2342 06/05/14 0450 06/06/14 0420  LATICACIDVEN 6.9* 6.4* 2.1  --   --   PROCALCITON  --  9.08  --  27.30 11.47   ABG  Recent Labs Lab 06/04/14 1247 06/04/14 1451 06/05/14 0640  PHART 7.280* 7.330* 7.381  PCO2ART 46.4* 39.1 33.4*  PO2ART 323.0* 60.0* 127.0*   Liver Enzymes   Recent Labs Lab 06/04/14 1149 06/04/14 1400 06/06/14 0420  AST 32 33 26  ALT ALKPHOS 44 41 38*  BILITOT 1.1 0.8 0.6  ALBUMIN 2.7* 2.6* 2.1*   Cardiac Enzymes  Recent Labs Lab 06/06/14 1140 06/07/14 0008 06/07/14 0450  TROPONINI 1.51* 1.14* 1.00*   Glucose  Recent Labs Lab 06/06/14 0827 06/06/14 1124 06/06/14 1539 06/06/14 1913 06/07/14 0013 06/07/14 0412  GLUCAP 111* 114* 117*  146* 128* 105*    Imaging Dg Chest Port 1 View  06/06/2014   CLINICAL DATA:  Shortness of breath  EXAM: PORTABLE CHEST - 1 VIEW  COMPARISON:  06/04/2014  FINDINGS: Endotracheal tube tip at the clavicular heads. A left IJ catheter remains at the SVC level. Gastric suction tube has been removed.  Increasing diffuse interstitial opacities with a hazy appearance of the lower chest, likely pleural fluid. No pneumothorax. Heart size is normal. Unchanged aortic and hilar contours. Biapical pleural/extrapleural thickening which is symmetric.  IMPRESSION: 1. Endotracheal tube and left IJ catheter remain in good position. 2. Interval development of pulmonary edema and small pleural effusions.   Electronically Signed   By: Tiburcio Pea M.D.   On: 06/06/2014 04:56     ASSESSMENT / PLAN:  PULMONARY OETT 1/18>>> A: VDRF due to AMS, hematemesis, ?aspiration. Mixed acidosis - mainly metabolic. Resolved.  P:   - CXR showed some infiltrate on left initially that's improving. Had pleural effusion.   On abx for possible aspiration pna - See ID section. - on vent. Wean as tolerates. WUA. Daily SVT. Likely extubate today.  CARDIOVASCULAR CVL L IJ TLC 1/18>>> A: Sinus tach with occasional Wide complex tachy - likely 2/2 to blood loss, sepsis, shock and NSTEMI, also may be due to benzo withdrawal as he is on it at home  hypotension - improved with pressor and fluid. Off pressors. CAD Troponin elevation likely 2/2 to demand/shock Severe systolic CHF EF 25% on ECHO 06/06/14. ?Takotsubo appearance with function worse at apex. Makes sense with current stress.   P:  - cardiology on board for troponin elevation. Thinks likely demand from his current shock/stress. No interventions currently. Ok to hold on plavix if needed since his new stent on right Iliac is considered low risk. GI is ok to restart it. So will restart when has a tube or taking PO. - intermittent WCT better on metoprolol  q6hr scheduled for now.  Switch to po metoprolol later. - give another lasix  IV today to maintain negative balance. CXR tomorrow.  - cont to trend trops to capture the peak qdaily.  - consider repeating another ECHO when patient improves from current episode. Reduced EF may be temporary from current stress on his body. I expect it will improve. May need to have ICD if EF remains this low.  RENAL A:  B/L crt 1.05. Currently ~1.4. Likely prerenal or ATN with acute blood loss vs septic shock. Not much changed  today. Low mag, low K+, low phos.  Mixed acidosis - mainly metabolic, likely from lactic acidosis/sepsis. Almost resolved.  P:   - maintain normal pressure - Correct electrolytes as indicated.  GASTROINTESTINAL A:   LA class D esophagitis. this is the source of the bleeding per EGD report.    P:   - IV protonix now. Needs qdaily protonix indefinitely. - Hold anti-platelets. - SCD.  HEMATOLOGIC A:  Acute on chronic anemia - Baseline hgb ~ 12-13, currently down around 8-9. Likely from esophagitis seen on EGD. No more drop, stable now. - leukoopenia then now normal WBC - thrombocytopenia chronic.  P:  -  Changed CBC to daily. Transfuse goal hgb <8 since has Cardiac disease.  INFECTIOUS A:  Tmax 102.8. PCT 27.30. PCT now trending down. Afebrile overnight. ?aspiration per family. CXR showed left infiltrates which are improving. LA 6.9 max, then resolved. P:   BCx2 1/18>>> UC 1/18>>> Sputum 1/18>>> growing GPC in pairs (likely strep pneum).  Cefepime 1/18 >>1/20 Flagyl 1/18 >>  Ceftriaxone 1/20 >> 1/24? Cont ceftriaxone for 7 days of tx. Stop  Flagyl.  ENDOCRINE A:  DM.   No adrenal insuf - cortisol (56.6) P:   - ISS. - stop steroid.  NEUROLOGIC A:  AMS likely from septic shock and blood loss not improving Agitation Likely opiate dependance since he is on large dose at home. P:   RASS goal: 0 - Stopped fentanyl drip, on prn fentanyl + precedex. - LP today to rule out meningitis for  AMS. - is on ativan prn since was on benzo at home. Would restart home xanax when takes PO. Haldol for breakthrough agitation as well. - takes MS IR 15 TID at home.    FAMILY  - Updates: Family updated bedside 1/20  Hyacinth Meeker, M.D PGY-1 Internal Medicine Pager [phone]   ATTENDING NOTE: I have personally reviewed patient's available data, including medical history, events of note, physical examination and test results as part of my evaluation. I have discussed with resident/NP and other careteam providers such as pharmacist, RN and RRT & co-ordinated with consultants. In addition, I personally evaluated patient and elicited key history of hematemesis today esophagitis, intubated for airway protection, intermittent agitation, narcotic and benzo use, exam findings of does not follow commands, mild secretions, bilateral clear lungs & labs showing mild leukocytosis, troponin peaked at 1.1, stable hemoglobin Echo and chest x-ray reviewed  Impression- unclear cause of acute encephalopathy ? takatsubo Esophagitis  Recommend- proceed with LP to rule out CNS infection Continue low-dose Precedex, with fentanyl and low-dose benzo for breakthrough agitation Place nasogastric tube to start feeding  Care during the described time interval was provided by me and/or other providers on the critical care team.  I have reviewed this patient's available data, including medical history, events of note, physical examination and test results as part of my evaluation  CC time x 21m  Rest per medical resident whose note is outlined above and that I agree with and edited in full.   Oretha Milch MD

## 2014-06-08 ENCOUNTER — Inpatient Hospital Stay (HOSPITAL_COMMUNITY): Payer: Medicare Other

## 2014-06-08 DIAGNOSIS — I429 Cardiomyopathy, unspecified: Secondary | ICD-10-CM

## 2014-06-08 LAB — TYPE AND SCREEN
ABO/RH(D): O POS
ANTIBODY SCREEN: NEGATIVE
Unit division: 0
Unit division: 0

## 2014-06-08 LAB — PROCALCITONIN: Procalcitonin: 2.7 ng/mL

## 2014-06-08 LAB — GLUCOSE, CAPILLARY
Glucose-Capillary: 157 mg/dL — ABNORMAL HIGH (ref 70–99)
Glucose-Capillary: 164 mg/dL — ABNORMAL HIGH (ref 70–99)
Glucose-Capillary: 192 mg/dL — ABNORMAL HIGH (ref 70–99)
Glucose-Capillary: 218 mg/dL — ABNORMAL HIGH (ref 70–99)
Glucose-Capillary: 229 mg/dL — ABNORMAL HIGH (ref 70–99)
Glucose-Capillary: 71 mg/dL (ref 70–99)

## 2014-06-08 LAB — BASIC METABOLIC PANEL
Anion gap: 11 (ref 5–15)
BUN: 30 mg/dL — AB (ref 6–23)
CO2: 20 mmol/L (ref 19–32)
Calcium: 8.7 mg/dL (ref 8.4–10.5)
Chloride: 114 mEq/L — ABNORMAL HIGH (ref 96–112)
Creatinine, Ser: 1.54 mg/dL — ABNORMAL HIGH (ref 0.50–1.35)
GFR calc Af Amer: 48 mL/min — ABNORMAL LOW (ref >90)
GFR calc non Af Amer: 41 mL/min — ABNORMAL LOW (ref >90)
GLUCOSE: 168 mg/dL — AB (ref 70–99)
Potassium: 3.5 mmol/L (ref 3.5–5.1)
SODIUM: 145 mmol/L (ref 135–145)

## 2014-06-08 LAB — CARBOXYHEMOGLOBIN
Carboxyhemoglobin: 1 % (ref 0.5–1.5)
METHEMOGLOBIN: 0.9 % (ref 0.0–1.5)
O2 Saturation: 74.8 %
Total hemoglobin: 8.6 g/dL — ABNORMAL LOW (ref 13.5–18.0)

## 2014-06-08 LAB — HERPES SIMPLEX VIRUS(HSV) DNA BY PCR
HSV 1 DNA: NOT DETECTED
HSV 2 DNA: NOT DETECTED

## 2014-06-08 LAB — CBC
HCT: 24.2 % — ABNORMAL LOW (ref 39.0–52.0)
Hemoglobin: 8.4 g/dL — ABNORMAL LOW (ref 13.0–17.0)
MCH: 31.2 pg (ref 26.0–34.0)
MCHC: 34.7 g/dL (ref 30.0–36.0)
MCV: 90 fL (ref 78.0–100.0)
PLATELETS: 130 10*3/uL — AB (ref 150–400)
RBC: 2.69 MIL/uL — ABNORMAL LOW (ref 4.22–5.81)
RDW: 14.4 % (ref 11.5–15.5)
WBC: 10.9 10*3/uL — ABNORMAL HIGH (ref 4.0–10.5)

## 2014-06-08 MED ORDER — SODIUM CHLORIDE 0.9 % IV SOLN
INTRAVENOUS | Status: DC
  Administered 2014-06-08 – 2014-06-12 (×4): via INTRAVENOUS

## 2014-06-08 MED ORDER — CARVEDILOL 6.25 MG PO TABS
6.2500 mg | ORAL_TABLET | Freq: Two times a day (BID) | ORAL | Status: DC
  Administered 2014-06-08 – 2014-06-10 (×4): 6.25 mg via ORAL
  Filled 2014-06-08 (×6): qty 1

## 2014-06-08 MED ORDER — CARVEDILOL 3.125 MG PO TABS
3.1250 mg | ORAL_TABLET | Freq: Two times a day (BID) | ORAL | Status: DC
  Administered 2014-06-08: 3.125 mg via ORAL
  Filled 2014-06-08 (×3): qty 1

## 2014-06-08 MED ORDER — ENALAPRIL MALEATE 2.5 MG PO TABS
2.5000 mg | ORAL_TABLET | Freq: Two times a day (BID) | ORAL | Status: DC
  Administered 2014-06-08 – 2014-06-11 (×7): 2.5 mg via ORAL
  Filled 2014-06-08 (×8): qty 1

## 2014-06-08 NOTE — Procedures (Signed)
ELECTROENCEPHALOGRAM REPORT  Date of Study: 06/08/2014  Patient's Name: Marvin Leon MRN: [id number] Date of Birth: 1933/01/09  Referring Provider: Dr. Felicie Mornavid Smith  Clinical History: This is a 81 year old man admitted for altered mental status with hematemesis, aspiration PNA and sepsis.  Medications: Sedated with Precedex acetaminophen (TYLENOL) solution 650 mg carvedilol (COREG) tablet 6.25 mg cefTRIAXone (ROCEPHIN) 1 g in dextrose 5 % 50 mL IVPB - Premix enalapril (VASOTEC) tablet 2.5 mg metroNIDAZOLE (FLAGYL) IVPB 500 mg pantoprazole (PROTONIX) injection 40 mg  Technical Summary: A multichannel digital EEG recording measured by the international 10-20 system with electrodes applied with paste and impedances below 5000 ohms performed as portable with EKG monitoring in an intubated and sedated patient.  Hyperventilation and photic stimulation were not performed.  The digital EEG was referentially recorded, reformatted, and digitally filtered in a variety of bipolar and referential montages for optimal display.   Description: The patient is intubated and sedated on Precedex during the recording. There is no posterior dominant rhythm. The background consists of a large amount of diffuse 4-5 Hz theta and 2-3 Hz delta slowing with triphasic waves seen. Normal sleep architecture was not seen. With noxious stimulation, there is slight increase in faster frequencies and increase in triphasic waves. There were no epileptiform discharges or electrographic seizures seen.    EKG lead showed sinus tachycardia.  Impression: This sedated EEG is abnormal due to the presence of: 1. Moderate diffuse slowing of the background 2. Triphasic waves throughout the recording  Clinical Correlation of the above findings indicates diffuse cerebral dysfunction that is non-specific in etiology and can be seen with hypoxic/ischemic injury, toxic/metabolic encephalopathies, or medication effect. Triphasic  waves are typically seen with hepatic encephalopathy, but can be seen with other metabolic encephalopathies as well.   Patrcia DollyKaren Tedd Cottrill, M.D.

## 2014-06-08 NOTE — Progress Notes (Signed)
UR Completed.  [phone]  

## 2014-06-08 NOTE — Consult Note (Signed)
NEURO HOSPITALIST CONSULT NOTE    Reason for Consult: AMS  HPI:                                                                                                                                          Marvin Leon is an 81 y.o. male with significant cardiac history who has been taking an aspirin a day with no NSAIDs or other anti-platelet agents who presents to the hospital with hematemesis and AMS. The day prior to arrival he was noted to feel lethargic and slightly confused.  The next morning Wife woke up and noted he was more confused and vomiting coffee ground emesis in his bed. In the ED the patient was noted to have difficulty breathing, acute respiratory failure with hypoxemia, confusion and not protecting his airway and was intubated for airway protection. While hospitalized he was found to have a elevated WBC, febrile aspiration PNA and sepsis.  Currently on Ceftriaxone. Patient has remained Febrile while in hospital and currently has a WBC 10.9.  Pateint has remained encephalopathic and neurology was asked to evaluate.   Echo shows:  Echo (1/20) with EF 25%, wall motion abnormality pattern not classic for Takotsubo cardiomyopathy but worse function towards apex. Normal RV  Past Medical History  Diagnosis Date  . Arthritis   . GERD (gastroesophageal reflux disease)   . Hyperlipidemia   . Hypertension   . Coronary artery disease   . Carotid artery disease   . Peripheral arterial disease     50% ostial right common iliac artery stenosis without claudication  . Emphysema   . Shortness of breath   . Diabetes mellitus without complication     type 2  . Anxiety     Past Surgical History  Procedure Laterality Date  . Colonoscopy    . Cardiac catheterization      3 stents placed  . Cholecystectomy N/A 04/07/2013    Procedure: LAPAROSCOPIC CHOLECYSTECTOMY WITH INTRAOPERATIVE CHOLANGIOGRAM;  Surgeon: Robyne Askew, MD;  Location: [facility] OR;  Service: General;   Laterality: N/A;  . Iliac artery stent Right 02/22/2014    Dr. Allyson Sabal 7 mm x 22 mm  ICast covered stent  . Coronary angioplasty      all 3 heart vessels   . Esophagogastroduodenoscopy Left 06/05/2014    Procedure: ESOPHAGOGASTRODUODENOSCOPY (EGD);  Surgeon: Theda Belfast, MD;  Location: [facility] ENDOSCOPY;  Service: Endoscopy;  Laterality: Left;    Family History  Problem Relation Age of Onset  . Heart disease Mother   . Heart disease Father      Social History:  reports that he quit smoking about 37 years ago. He has never used smokeless tobacco. He reports that he does not drink alcohol or use illicit drugs.  Allergies  Allergen Reactions  . Darvocet [Propoxyphene N-Acetaminophen] Nausea And Vomiting  . Penicillins Nausea And Vomiting    MEDICATIONS:                                                                                                                     Prior to Admission:  Prescriptions prior to admission  Medication Sig Dispense Refill Last Dose  . ALPRAZolam (XANAX) 1 MG tablet Take 1 mg by mouth 2 (two) times daily.     06/03/2014 at Unknown time  . aspirin EC 81 MG tablet Take 81 mg by mouth every morning.     06/03/2014 at Unknown time  . cetirizine (ZYRTEC) 10 MG tablet Take 5 mg by mouth daily.   06/03/2014 at Unknown time  . cholecalciferol (VITAMIN D) 400 UNITS TABS tablet Take 400 Units by mouth 2 (two) times daily.    06/03/2014 at Unknown time  . clopidogrel (PLAVIX) 75 MG tablet Take 1 tablet (75 mg total) by mouth daily. 30 tablet 11 06/03/2014 at Unknown time  . dexlansoprazole (DEXILANT) 60 MG capsule Take 60 mg by mouth daily before breakfast.    06/03/2014 at Unknown time  . docusate sodium (COLACE) 100 MG capsule Take 200 mg by mouth daily.    06/03/2014 at Unknown time  . ezetimibe-simvastatin (VYTORIN) 10-40 MG per tablet Take 1 tablet by mouth 3 (three) times a week. Mondays, Thursdays and Saturdays   Past Week at Unknown time  . Ferrous Sulfate (IRON) 28 MG  TABS Take 28 mg by mouth once a week. Wednesdays   Past Week at Unknown time  . isosorbide mononitrate (IMDUR) 30 MG 24 hr tablet Take 15 mg by mouth at bedtime.     06/03/2014 at Unknown time  . metFORMIN (GLUCOPHAGE) 1000 MG tablet Take 1 tablet (1,000 mg total) by mouth 2 (two) times daily with a meal. HOLD for 2 days, restart on 02/25/2014   06/03/2014 at Unknown time  . metoCLOPramide (REGLAN) 10 MG tablet Take 5 mg by mouth daily before breakfast.   06/03/2014 at Unknown time  . metoprolol (TOPROL-XL) 50 MG 24 hr tablet Take 25 mg by mouth every morning.    06/03/2014 at 0800  . morphine (MSIR) 15 MG tablet Take 15 mg by mouth 3 (three) times daily as needed for severe pain.   06/04/2014 at 0600  . Multiple Vitamins-Minerals (MULTIVITAMINS THER. W/MINERALS) TABS Take 1 tablet by mouth daily.     06/03/2014 at Unknown time  . niacin (NIASPAN) 750 MG CR tablet Take 750-1,500 mg by mouth at bedtime. Alternates every 2 days - 1500mg  x 2 days, 750mg  x 2 days, 1500mg  x 2 days.....   06/03/2014 at Unknown time  . PARoxetine (PAXIL) 20 MG tablet Take 20 mg by mouth every evening.     06/03/2014 at Unknown time  . pregabalin (LYRICA) 75 MG capsule Take 1 capsule (75 mg total) by mouth 2 (two) times daily. 180 capsule 3 06/03/2014 at Unknown time  .  Probiotic Product (ULTRAFLORA IMMUNE HEALTH PO) Take 1 tablet by mouth daily before breakfast.   06/03/2014 at Unknown time  . tamsulosin (FLOMAX) 0.4 MG CAPS Take 0.4 mg by mouth daily after supper.    06/03/2014 at Unknown time   Scheduled: . antiseptic oral rinse  7 mL Mouth Rinse QID  . carvedilol  3.125 mg Oral BID WC  . cefTRIAXone (ROCEPHIN)  IV  1 g Intravenous Q24H  . chlorhexidine  15 mL Mouth Rinse BID  . insulin aspart  2-6 Units Subcutaneous 6 times per day  . metronidazole  500 mg Intravenous Q8H  . pantoprazole (PROTONIX) IV  40 mg Intravenous Q12H  . sodium chloride  1,000 mL Intravenous Once     ROS:                                                                                                                                        History obtained from unobtainable from patient due to mental status   Blood pressure 138/73, pulse 97, temperature 101.3 F (38.5 C), temperature source Core (Comment), resp. rate 24, weight 77.8 kg (171 lb 8.3 oz), SpO2 100 %.   Neurologic Examination:                                                                                                      HEENT-  Normocephalic, no lesions, without obvious abnormality.  Normal external eye and conjunctiva.  Normal TM's bilaterally.  Normal auditory canals and external ears. Normal external nose, mucus membranes and septum.  Normal pharynx. Cardiovascular- S1, S2 normal, pulses palpable throughout   Lungs- chest clear, no wheezing, rales, normal symmetric air entry Abdomen- normal findings: bowel sounds normal Extremities- no edema Lymph-no adenopathy palpable Musculoskeletal-no muscular tenderness noted Skin-warm and dry, no hyperpigmentation, vitiligo, or suspicious lesions  Neurological Examination Mental Status: Intubated, on Precedex, breathing over the vent, moving spontaneously, follows no commands.  Cranial Nerves: II: Discs flat bilaterally; no blink to threat bilaterally, pupils equal, round, reactive to light and accommodation III,IV, VI: ptosis not present, oculocephalic maneuver intact V,VII: face symmetric, corneal reflex intact VIII: opens eyes to voice IX,X: gag reflex present XI: bilateral shoulder shrug XII: unable to assess due to intubation.  Motor: Moving bilateral UE spontaneously at times. No spontaneous movement noted in LE.  Sensory: no response to pain in bilateral UE or LE Deep Tendon Reflexes: 1+ and symmetric throughout UE no KJ or AJ Plantars: Up going bilaterally Cerebellar: Unable  to assess Gait: unable to assess      Lab Results: Basic Metabolic Panel:  Recent Labs Lab 06/04/14 1400 06/04/14 2342  06/05/14 0300 06/05/14 1700 06/06/14 0420 06/06/14 1700 06/07/14 0450 06/07/14 1600 06/08/14 0500  NA 141 143 141 141 143 142 144 145 145  K 4.4 3.1* 3.7 3.7 3.6 3.9 3.6 2.9* 3.5  CL 103 115* 114* 115* 116* 115* 115* 112 114*  CO2 23 26 20 22 19 21 20 19 20   GLUCOSE 209* 132* 161* 106* 110* 148* 138* 144* 168*  BUN 37* 32* 32* 27* 24* 24* 25* 26* 30*  CREATININE 1.85* 1.43* 1.48* 1.48* 1.42* 1.65* 1.49* 1.56* 1.54*  CALCIUM 8.5 7.4* 7.8* 8.1* 8.3* 8.4 8.7 8.9 8.7  MG 1.1* 1.5 1.4* 1.7  --   --  1.6  --   --   PHOS 2.4  --  1.6* 2.4  --   --  2.7  --   --     Liver Function Tests:  Recent Labs Lab 06/04/14 1149 06/04/14 1400 06/06/14 0420  AST 32 33 26  ALT 12 12 14   ALKPHOS 44 41 38*  BILITOT 1.1 0.8 0.6  PROT 4.5* 4.5* 3.9*  ALBUMIN 2.7* 2.6* 2.1*    Recent Labs Lab 06/04/14 2342  LIPASE 15  AMYLASE 143*    Recent Labs Lab 06/04/14 1400  AMMONIA 12    CBC:  Recent Labs Lab 06/04/14 1149  06/04/14 1400  06/04/14 2342 06/05/14 0300  06/06/14 0420 06/06/14 0900 06/06/14 1550 06/07/14 0029 06/07/14 0450 06/08/14 0500  WBC 4.3  --  3.4*  --  2.3* 6.3  --  7.6  --   --   --  11.0* 10.9*  NEUTROABS 3.3  --  2.5  --   --   --   --   --   --   --   --   --   --   HGB 13.0  < > 11.9*  < > 8.2* 9.0*  < > 8.5* 8.1* 8.5* 8.1* 8.3* 8.4*  HCT 39.2  < > 36.2*  < > 24.2* 26.3*  < > 25.5* 24.2* 25.3* 24.3* 24.4* 24.2*  MCV 97.0  --  96.3  --  92.4 92.0  --  94.8  --   --   --  92.1 90.0  PLT 139*  --  130*  --  85* 101*  --  99*  --   --   --  112* 130*  < > = values in this interval not displayed.  Cardiac Enzymes:  Recent Labs Lab 06/05/14 1100 06/06/14 0008 06/06/14 1140 06/07/14 0008 06/07/14 0450  TROPONINI 0.56* 1.43* 1.51* 1.14* 1.00*    Lipid Panel: No results for input(s): CHOL, TRIG, HDL, CHOLHDL, VLDL, LDLCALC in the last 168 hours.  CBG:  Recent Labs Lab 06/07/14 1544 06/07/14 1917 06/08/14 0026 06/08/14 0415 06/08/14 0807   GLUCAP 129* 151* 71 157* 164*    Microbiology: Results for orders placed or performed during the hospital encounter of 06/04/14  Blood culture (routine x 2)     Status: None (Preliminary result)   Collection Time: 06/04/14 11:50 AM  Result Value Ref Range Status   Specimen Description BLOOD HAND LEFT  Final   Special Requests BOTTLES DRAWN AEROBIC AND ANAEROBIC 2CC  Final   Culture   Final           BLOOD CULTURE RECEIVED NO GROWTH TO DATE CULTURE WILL BE HELD FOR 5  DAYS BEFORE ISSUING A FINAL NEGATIVE REPORT Performed at [facility]    Report Status PENDING  Incomplete  Blood culture (routine x 2)     Status: None (Preliminary result)   Collection Time: 06/04/14 11:54 AM  Result Value Ref Range Status   Specimen Description BLOOD LEFT FOREARM  Final   Special Requests BOTTLES DRAWN AEROBIC ONLY 2CC  Final   Culture   Final           BLOOD CULTURE RECEIVED NO GROWTH TO DATE CULTURE WILL BE HELD FOR 5 DAYS BEFORE ISSUING A FINAL NEGATIVE REPORT Performed at [facility]    Report Status PENDING  Incomplete  Culture, blood (routine x 2)     Status: None (Preliminary result)   Collection Time: 06/04/14  1:50 PM  Result Value Ref Range Status   Specimen Description BLOOD LEFT ANTECUBITAL  Final   Special Requests BOTTLES DRAWN AEROBIC AND ANAEROBIC  Final   Culture   Final           BLOOD CULTURE RECEIVED NO GROWTH TO DATE CULTURE WILL BE HELD FOR 5 DAYS BEFORE ISSUING A FINAL NEGATIVE REPORT Note: Culture results may be compromised due to an excessive volume of blood received in culture bottles. Performed at [facility]    Report Status PENDING  Incomplete  Culture, blood (routine x 2)     Status: None (Preliminary result)   Collection Time: 06/04/14  2:00 PM  Result Value Ref Range Status   Specimen Description BLOOD LEFT HAND  Final   Special Requests BOTTLES DRAWN AEROBIC AND ANAEROBIC  Final   Culture   Final           BLOOD CULTURE  RECEIVED NO GROWTH TO DATE CULTURE WILL BE HELD FOR 5 DAYS BEFORE ISSUING A FINAL NEGATIVE REPORT Performed at [facility]    Report Status PENDING  Incomplete  MRSA PCR Screening     Status: None   Collection Time: 06/04/14  4:20 PM  Result Value Ref Range Status   MRSA by PCR NEGATIVE NEGATIVE Final    Comment:        The GeneXpert MRSA Assay (FDA approved for NASAL specimens only), is one component of a comprehensive MRSA colonization surveillance program. It is not intended to diagnose MRSA infection nor to guide or monitor treatment for MRSA infections.   Urine culture     Status: None   Collection Time: 06/04/14  4:46 PM  Result Value Ref Range Status   Specimen Description URINE, CATHETERIZED  Final   Special Requests Normal  Final   Colony Count NO GROWTH Performed at [facility]   Final   Culture NO GROWTH Performed at [facility]   Final   Report Status 06/05/2014 FINAL  Final  Culture, respiratory (NON-Expectorated)     Status: None   Collection Time: 06/04/14  4:46 PM  Result Value Ref Range Status   Specimen Description TRACHEAL ASPIRATE  Final   Special Requests NONE  Final   Gram Stain   Final    FEW WBC PRESENT, PREDOMINANTLY MONONUCLEAR RARE SQUAMOUS EPITHELIAL CELLS PRESENT FEW GRAM POSITIVE COCCI IN PAIRS IN CLUSTERS MODERATE YEAST Performed at [facility]    Culture   Final    Non-Pathogenic Oropharyngeal-type Flora Isolated. Performed at [facility]    Report Status 06/07/2014 FINAL  Final  CSF culture     Status: None (Preliminary result)   Collection  Time: 06/07/14  3:00 PM  Result Value Ref Range Status   Specimen Description CSF  Final   Special Requests Normal  Final   Gram Stain   Final    WBC PRESENT, PREDOMINANTLY MONONUCLEAR NO ORGANISMS SEEN CYTOSPIN Performed at [facility] Performed at [facility]    Culture NO GROWTH Performed at [facility]   Final    Report Status PENDING  Incomplete  Gram stain - STAT with CSF culture     Status: None   Collection Time: 06/07/14  3:00 PM  Result Value Ref Range Status   Specimen Description CSF  Final   Special Requests Normal  Final   Gram Stain   Final    CYTOSPIN PREP WBC PRESENT, PREDOMINANTLY MONONUCLEAR NO ORGANISMS SEEN    Report Status 06/07/2014 FINAL  Final  MRSA PCR Screening     Status: None   Collection Time: 06/07/14  9:51 PM  Result Value Ref Range Status   MRSA by PCR NEGATIVE NEGATIVE Final    Comment:        The GeneXpert MRSA Assay (FDA approved for NASAL specimens only), is one component of a comprehensive MRSA colonization surveillance program. It is not intended to diagnose MRSA infection nor to guide or monitor treatment for MRSA infections.     Coagulation Studies: No results for input(s): LABPROT, INR in the last 72 hours.  Imaging: Dg Chest Port 1 View  06/08/2014   CLINICAL DATA:  Shortness of breath  EXAM: PORTABLE CHEST - 1 VIEW  COMPARISON:  06/07/2014  FINDINGS: Endotracheal tube tip at the clavicular heads. There is a new feeding tube which continues below the diaphragm, tip at the stomach based on KUB from yesterday. Stable left IJ catheter, tip at the upper cavoatrial junction.  Normal heart size and mediastinal contours. Bilateral interstitial and airspace opacities are unchanged, asymmetric to the left mid and upper lung.  IMPRESSION: 1. Stable positioning of endotracheal tube and left IJ catheter. Feeding tube tip noted KUB from yesterday. 2. Unchanged asymmetric pulmonary edema versus pneumonia.   Electronically Signed   By: Tiburcio Pea M.D.   On: 06/08/2014 06:20   Dg Chest Port 1 View  06/07/2014   CLINICAL DATA:  Subsequent evaluation for respiratory failure  EXAM: PORTABLE CHEST - 1 VIEW  COMPARISON:  06/06/2014  FINDINGS: No change in the position of left central line or endotracheal tube.  Heart size upper normal and stable. Mild vascular  congestion, with persistent peribronchial cuffing and scattered foci of bilateral interstitial and alveolar opacities. When compared to the prior study, bilateral opacities are slightly worse. Tiny left effusion not significantly different.  IMPRESSION: Findings again consistent with moderate pulmonary edema slightly worse when compared to the prior study.   Electronically Signed   By: Esperanza Heir M.D.   On: 06/07/2014 10:47   Dg Abd Portable 1v  06/07/2014   CLINICAL DATA:  Status post feeding catheter placement  EXAM: PORTABLE ABDOMEN - 1 VIEW  COMPARISON:  06/04/14  FINDINGS: A nasogastric catheter has been removed. A new feeding catheter is now seen just beyond the gastroesophageal junction. This could be advanced if desired. The cardiac shadow is stable. Left retrocardiac atelectasis is noted. No other focal abnormality is seen.  IMPRESSION: Feeding catheter just beyond the gastroesophageal junction.  These results will be called to the ordering clinician or representative by the Radiologist Assistant, and communication documented in the PACS or zVision Dashboard.   Electronically Signed  By: Alcide CleverMark  Lukens M.D.   On: 06/07/2014 12:31    Felicie MornDavid Smith PA-C [facility] [phone]  06/08/2014, 10:02 AM   Patient seen and examined.  Clinical course and management discussed.  Necessary edits performed.  I agree with the above.  Assessment and plan of care developed and discussed below.     Assessment/Plan: 81 year old male with sepsis and GIB.  Has not returned to baseline mental status.  Continues to be febrile and white blood cell count elevated.  Patient may very well be encephalopathic as a consequence.  Head CT from admission personally reviewed and shows no acute changes.  LP performed to rule out CNS infection.  Protein elevated but glucose normal and wbc count negligible.  Ammonia normal.  LFT's unremarkable.  Has multiple vascular risk factors and a low EF.  Patient did have a  hypoxic episode earlier in his hospitalization as well.  Can not rule out an intracerebral process.    Recommendations: 1.  EEG 2.  MRI of the brain without contrast   Thana FarrLeslie Eesha Schmaltz, MD [facility] [phone]  06/08/2014  1:40 PM

## 2014-06-08 NOTE — Progress Notes (Signed)
Patient ID: Marvin Leon, male   DOB: [birth year], 81 y.o.   MRN: [id number]    SUBJECTIVE: Patient remains intubated/sedated. EGD 1/19 LA class D esophagitis. Troponin peaked 1.5.   Remains in sinus tachycardia with HR 105-110s. CVP 7.   Co-ox 75%   Echo (1/20) with EF 25%, wall motion abnormality pattern not classic for Takotsubo cardiomyopathy but worse function towards apex.  Normal RV.   Scheduled Meds: . antiseptic oral rinse  7 mL Mouth Rinse QID  . carvedilol  3.125 mg Oral BID WC  . cefTRIAXone (ROCEPHIN)  IV  1 g Intravenous Q24H  . chlorhexidine  15 mL Mouth Rinse BID  . insulin aspart  2-6 Units Subcutaneous 6 times per day  . metronidazole  500 mg Intravenous Q8H  . pantoprazole (PROTONIX) IV  40 mg Intravenous Q12H  . sodium chloride  1,000 mL Intravenous Once   Continuous Infusions: . sodium chloride 20 mL/hr at 06/08/14 0445  . dexmedetomidine 0.6 mcg/kg/hr (06/08/14 0839)  . feeding supplement (VITAL AF 1.2 CAL) 1,000 mL (06/08/14 0600)   PRN Meds:.acetaminophen (TYLENOL) oral liquid 160 mg/5 mL, fentaNYL, haloperidol lactate, LORazepam, metoprolol    Filed Vitals:   06/08/14 0500 06/08/14 0600 06/08/14 0700 06/08/14 0830  BP: 129/76 126/71 136/79 138/73  Pulse:    97  Temp: 101.5 F (38.6 C) 101.3 F (38.5 C) 101.3 F (38.5 C)   TempSrc:      Resp: Weight:      SpO2:    100%    Intake/Output Summary (Last 24 hours) at 06/08/14 1117 Last data filed at 06/08/14 0800  Gross per 24 hour  Intake 1325.9 ml  Output   1920 ml  Net -594.1 ml    LABS: Basic Metabolic Panel:  Recent Labs  78/29/56 1700  06/07/14 0450 06/07/14 1600 06/08/14 0500  NA 141  < > 144 145 145  K 3.7  < > 3.6 2.9* 3.5  CL 115*  < > 115* 112 114*  CO2 22  < > GLUCOSE 106*  < > 138* 144* 168*  BUN 27*  < > 25* 26* 30*  CREATININE 1.48*  < > 1.49* 1.56* 1.54*  CALCIUM 8.1*  < > 8.7 8.9 8.7  MG 1.7  --  1.6  --   --   PHOS 2.4  --  2.7  --   --     < > = values in this interval not displayed. Liver Function Tests:  Recent Labs  06/06/14 0420  AST 26  ALT 14  ALKPHOS 38*  BILITOT 0.6  PROT 3.9*  ALBUMIN 2.1*   No results for input(s): LIPASE, AMYLASE in the last 72 hours. CBC:  Recent Labs  06/07/14 0450 06/08/14 0500  WBC 11.0* 10.9*  HGB 8.3* 8.4*  HCT 24.4* 24.2*  MCV 92.1 90.0  PLT 112* 130*   Cardiac Enzymes:  Recent Labs  06/06/14 1140 06/07/14 0008 06/07/14 0450  TROPONINI 1.51* 1.14* 1.00*   BNP: Invalid input(s): POCBNP D-Dimer: No results for input(s): DDIMER in the last 72 hours. Hemoglobin A1C: No results for input(s): HGBA1C in the last 72 hours. Fasting Lipid Panel: No results for input(s): CHOL, HDL, LDLCALC, TRIG, CHOLHDL, LDLDIRECT in the last 72 hours. Thyroid Function Tests: No results for input(s): TSH, T4TOTAL, T3FREE, THYROIDAB in the last 72 hours.  Invalid input(s): FREET3 Anemia Panel: No results for input(s): VITAMINB12, FOLATE, FERRITIN, TIBC, IRON, RETICCTPCT in the  last 72 hours.  RADIOLOGY: Ct Head Wo Contrast  06/04/2014   CLINICAL DATA:  Initial encounter for patient found unresponsive  EXAM: CT HEAD WITHOUT CONTRAST  TECHNIQUE: Contiguous axial images were obtained from the base of the skull through the vertex without intravenous contrast.  COMPARISON:  Outside MRI from 07/20/2009.  MRI from 05/04/2007.  FINDINGS: There is no evidence for acute hemorrhage, hydrocephalus, mass lesion, or abnormal extra-axial fluid collection. Diffuse loss of parenchymal volume is consistent with atrophy. No definite CT evidence for acute infarction. The visualized paranasal sinuses and mastoid air cells are clear.  IMPRESSION: Mild atrophy.  No acute intracranial abnormality.   Electronically Signed   By: Kennith Center M.D.   On: 06/04/2014 16:03   Dg Chest Port 1 View  06/08/2014   CLINICAL DATA:  Shortness of breath  EXAM: PORTABLE CHEST - 1 VIEW  COMPARISON:  06/07/2014  FINDINGS:  Endotracheal tube tip at the clavicular heads. There is a new feeding tube which continues below the diaphragm, tip at the stomach based on KUB from yesterday. Stable left IJ catheter, tip at the upper cavoatrial junction.  Normal heart size and mediastinal contours. Bilateral interstitial and airspace opacities are unchanged, asymmetric to the left mid and upper lung.  IMPRESSION: 1. Stable positioning of endotracheal tube and left IJ catheter. Feeding tube tip noted KUB from yesterday. 2. Unchanged asymmetric pulmonary edema versus pneumonia.   Electronically Signed   By: Tiburcio Pea M.D.   On: 06/08/2014 06:20   Dg Chest Port 1 View  06/07/2014   CLINICAL DATA:  Subsequent evaluation for respiratory failure  EXAM: PORTABLE CHEST - 1 VIEW  COMPARISON:  06/06/2014  FINDINGS: No change in the position of left central line or endotracheal tube.  Heart size upper normal and stable. Mild vascular congestion, with persistent peribronchial cuffing and scattered foci of bilateral interstitial and alveolar opacities. When compared to the prior study, bilateral opacities are slightly worse. Tiny left effusion not significantly different.  IMPRESSION: Findings again consistent with moderate pulmonary edema slightly worse when compared to the prior study.   Electronically Signed   By: Esperanza Heir M.D.   On: 06/07/2014 10:47   Dg Chest Port 1 View  06/06/2014   CLINICAL DATA:  Shortness of breath  EXAM: PORTABLE CHEST - 1 VIEW  COMPARISON:  06/04/2014  FINDINGS: Endotracheal tube tip at the clavicular heads. A left IJ catheter remains at the SVC level. Gastric suction tube has been removed.  Increasing diffuse interstitial opacities with a hazy appearance of the lower chest, likely pleural fluid. No pneumothorax. Heart size is normal. Unchanged aortic and hilar contours. Biapical pleural/extrapleural thickening which is symmetric.  IMPRESSION: 1. Endotracheal tube and left IJ catheter remain in good position. 2.  Interval development of pulmonary edema and small pleural effusions.   Electronically Signed   By: Tiburcio Pea M.D.   On: 06/06/2014 04:56   Dg Chest Port 1 View  06/04/2014   CLINICAL DATA:  Acute respiratory failure. Upper GI bleed. New endotracheal tube and central line placement.  EXAM: PORTABLE CHEST - 1 VIEW 1:25 p.m.  COMPARISON:  06/04/2014 at 11:08 a.m.  FINDINGS: Endotracheal tube is in good position at the level of the thoracic inlet. New left jugular vein catheter tip is in the superior vena cava above the cavoatrial junction in good position. New NG tube tip is below the diaphragm.  The heart size and pulmonary vascularity are normal. Right lung is clear. Slight haziness  in the left lung could represent unilateral pulmonary edema. Chronic bilateral apical pleural thickening.  IMPRESSION: Tubes and lines in good position. Hazy infiltrate in the left lung has improved and probably represents unilateral pulmonary edema.   Electronically Signed   By: Geanie CooleyJim  Maxwell M.D.   On: 06/04/2014 15:13   Dg Chest Portable 1 View  06/04/2014   CLINICAL DATA:  Intubation.  EXAM: PORTABLE CHEST - 1 VIEW  COMPARISON:  02/16/2014.  FINDINGS: Endotracheal tube noted with its tip 1.3 cm above the carina. Diffuse left lung infiltrate noted consistent with pneumonia. Right lung is clear. Heart size is normal. No acute bony abnormality.  IMPRESSION: 1. Endotracheal tube noted with its tip 1.3 cm above the carina. 2. Diffuse left lung pulmonary infiltrate.   Electronically Signed   By: Maisie Fushomas  Register   On: 06/04/2014 11:32   Dg Abd Portable 1v  06/07/2014   CLINICAL DATA:  Status post feeding catheter placement  EXAM: PORTABLE ABDOMEN - 1 VIEW  COMPARISON:  06/04/14  FINDINGS: A nasogastric catheter has been removed. A new feeding catheter is now seen just beyond the gastroesophageal junction. This could be advanced if desired. The cardiac shadow is stable. Left retrocardiac atelectasis is noted. No other focal  abnormality is seen.  IMPRESSION: Feeding catheter just beyond the gastroesophageal junction.  These results will be called to the ordering clinician or representative by the Radiologist Assistant, and communication documented in the PACS or zVision Dashboard.   Electronically Signed   By: Alcide CleverMark  Lukens M.D.   On: 06/07/2014 12:31   Dg Abd Portable 1v  06/04/2014   CLINICAL DATA:  Gastrointestinal bleeding  EXAM: PORTABLE ABDOMEN - 1 VIEW  COMPARISON:  None.  FINDINGS: There is a nasogastric tube with tip over the gastric body. A pelvic thermistor is also noted.  Bowel gas pattern is nonobstructive.  A right iliac artery stent is noted.  IMPRESSION: Nasogastric tube tip over the gastric body.   Electronically Signed   By: Tiburcio PeaJonathan  Watts M.D.   On: 06/04/2014 23:54    PHYSICAL EXAM General: Intubated. Will awaken  Not following commands Neck: No JVD, no thyromegaly or thyroid nodule.  Lungs: Dependent crackles.  CV: Nondisplaced PMI.  Heart regular S1/S2, no S3/S4, no murmur.  tr ankle edema.   Abdomen: Soft, nontender, no hepatosplenomegaly, no distention.  Neurologic: awakens to stimuli  Extremities: No clubbing or cyanosis.   TELEMETRY: Reviewed telemetry pt in sinus tachy 105-110s  ASSESSMENT AND PLAN: 81 yo with CAD s/p PAD s/p recent DES to Ri iliac in 10/15 presented with upper GI bleeding/coffee grounds emesis and altered mental status.  He was intubated for airway protection.  He has suspected left-sided aspiration PNA.   1. Cardiomyopathy: EF 25% on echo.  Findings not classic for Takotsubo but function worse towards apex.  Could be stress (Takotsubo-type) CMP but cannot rule out CAD as cause.  However, troponin only peaked at 1.5 so doubt acute MI here. CVP 7, patient does not appear to be volume overloaded. Co-ox ok.  - Will increase Coreg. Add low dose enalapril for afterload reduction.  2. Upper GI bleeding: Coffee grounds emesis. EGD with severe esophagitis.  Hemoglobin stable.   Now off ASA and Plavix.  Recent PCI was a peripheral intervention (iliac stent 02/2014), lower risk of problems with stent off antiplatelet agents.  From notes it appears that GI ok'd resumption of Plavix.  Can start this and hold ASA 81 for a couple of weeks before  resuming.  3. Elevated troponin/CAD: Mild elevation to 1.5.  Probably demand ischemia with hypotension/hypoxemia. I suspect his cardiomyopathy is either a stress cardiomyopathy from this event or ischemic cardiomyopathy that preceded this event (doubt true ACS with plaque rupture this admission).  Last cath 2010 with patent stents. Plan to start back Plavix, as above.  Would consider cath versus Cardiolite prior to discharge.  Would not use heparin at this time.   4. ID: Suspect aspiration PNA.  Febrile with elevated PCT.  Abx per primary service.   Arvilla Meres MD 06/08/2014 11:17 AM

## 2014-06-08 NOTE — Progress Notes (Signed)
PULMONARY / CRITICAL CARE MEDICINE   Name: Marvin Leon MRN: [id number] DOB: [birth year]    ADMISSION DATE:  06/04/2014 CONSULTATION DATE:  06/04/2014  REFERRING MD :  EDP  CHIEF COMPLAINT:  VDRF and upper GI bleed  INITIAL PRESENTATION: 81 year old male hx of arthritis, GERD, HLD, HTN, CAD s/p 3 stents, emphysema, PAD , DMII on daily ASA and plavix since 02/22/14 since DES  iliac stent placement 02/2014, but no NSAID use who presents to the hospital with hematemesis and AMS.  In the ED the patient was noted to not be protecting his airway and was intubated for airway protection.  OGT was placed with 1500 ml of coffee ground black material was noted.  PCCM was called to admit to the ICU.  No recent etoh history. Normal cscope and endoscope 2009.  STUDIES:  1/18 NGT lavaged with only coffee grounds, no active bleeding. 1/18 CT - mild atrophy.  1/18 KUB - normal. 1/19: EGD. LA class D esophagitis. this is the source of the bleeding.   SIGNIFICANT EVENTS: 1/18 UGI bleed. hgb dropped. 1/19: had EGD, weaned off pressor.  1/20 - failed extubation. Off pressors.   SUBJECTIVE: Remains intubated.  Some agitation overnight -on precedex and prn fentanyl. Low grade fever  VITAL SIGNS: Temp:  [99.6 F (37.6 C)-101.5 F (38.6 C)] 101.3 F (38.5 C) (01/22 0700) Pulse Rate:  [70-144] 97 (01/22 0830) Resp:  [14-33] 24 (01/22 0830) BP: (112-146)/(63-86) 138/73 mmHg (01/22 0830) SpO2:  [98 %-100 %] 100 % (01/22 0830) FiO2 (%):  [40 %] 40 % (01/22 0830) Weight:  [77.8 kg (171 lb 8.3 oz)] 77.8 kg (171 lb 8.3 oz) (01/22 0452) HEMODYNAMICS: CVP:  [4 mmHg-7 mmHg] 7 mmHg VENTILATOR SETTINGS: Vent Mode:  [-] PRVC FiO2 (%):  [40 %] 40 % Set Rate:  [16 bmp] 16 bmp Vt Set:  [600 mL] 600 mL PEEP:  [5 cmH20] 5 cmH20 Plateau Pressure:  [15 cmH20-18 cmH20] 15 cmH20 INTAKE / OUTPUT:  Intake/Output Summary (Last 24 hours) at 06/08/14 0904 Last data filed at 06/08/14 0800  Gross per 24 hour  Intake  1337.8 ml  Output   2520 ml  Net -1182.2 ml    PHYSICAL EXAMINATION: General:  Intubated, sedated. NAD. Neuro: moves exts but does not follow commands HEENT:  [place]/AT Cardiovascular:  regular, tachy, Nl S1/S2, -M/R/G. Lungs:  CTAB Abdomen:  Soft, NT, ND and +BS. Musculoskeletal:  -edema and -tenderness. Skin:  Intact.  LABS:  CBC  Recent Labs Lab 06/06/14 0420  06/07/14 0029 06/07/14 0450 06/08/14 0500  WBC 7.6  --   --  11.0* 10.9*  HGB 8.5*  < > 8.1* 8.3* 8.4*  HCT 25.5*  < > 24.3* 24.4* 24.2*  PLT 99*  --   --  112* 130*  < > = values in this interval not displayed. Coag's  Recent Labs Lab 06/04/14 1149 06/04/14 1400  APTT  --  30  INR 1.28 1.29   BMET  Recent Labs Lab 06/07/14 0450 06/07/14 1600 06/08/14 0500  NA 144 145 145  K 3.6 2.9* 3.5  CL 115* 112 114*  CO2 20 19 20   BUN 25* 26* 30*  CREATININE 1.49* 1.56* 1.54*  GLUCOSE 138* 144* 168*   Electrolytes  Recent Labs Lab 06/05/14 0300 06/05/14 1700  06/07/14 0450 06/07/14 1600 06/08/14 0500  CALCIUM 7.8* 8.1*  < > 8.7 8.9 8.7  MG 1.4* 1.7  --  1.6  --   --   PHOS  1.6* 2.4  --  2.7  --   --   < > = values in this interval not displayed. Sepsis Markers  Recent Labs Lab 06/04/14 1150 06/04/14 1400 06/04/14 2342 06/05/14 0450 06/06/14 0420  LATICACIDVEN 6.9* 6.4* 2.1  --   --   PROCALCITON  --  9.08  --  27.30 11.47   ABG  Recent Labs Lab 06/04/14 1247 06/04/14 1451 06/05/14 0640  PHART 7.280* 7.330* 7.381  PCO2ART 46.4* 39.1 33.4*  PO2ART 323.0* 60.0* 127.0*   Liver Enzymes   Recent Labs Lab 06/04/14 1149 06/04/14 1400 06/06/14 0420  AST 32 33 26  ALT ALKPHOS 44 41 38*  BILITOT 1.1 0.8 0.6  ALBUMIN 2.7* 2.6* 2.1*   Cardiac Enzymes  Recent Labs Lab 06/06/14 1140 06/07/14 0008 06/07/14 0450  TROPONINI 1.51* 1.14* 1.00*   Glucose  Recent Labs Lab 06/07/14 1107 06/07/14 1544 06/07/14 1917 06/08/14 0026 06/08/14 0415 06/08/14 0807  GLUCAP  116* 129* 151* 71 157* 164*    Imaging Dg Chest Port 1 View  06/07/2014   CLINICAL DATA:  Subsequent evaluation for respiratory failure  EXAM: PORTABLE CHEST - 1 VIEW  COMPARISON:  06/06/2014  FINDINGS: No change in the position of left central line or endotracheal tube.  Heart size upper normal and stable. Mild vascular congestion, with persistent peribronchial cuffing and scattered foci of bilateral interstitial and alveolar opacities. When compared to the prior study, bilateral opacities are slightly worse. Tiny left effusion not significantly different.  IMPRESSION: Findings again consistent with moderate pulmonary edema slightly worse when compared to the prior study.   Electronically Signed   By: Esperanza Heir M.D.   On: 06/07/2014 10:47   Dg Abd Portable 1v  06/07/2014   CLINICAL DATA:  Status post feeding catheter placement  EXAM: PORTABLE ABDOMEN - 1 VIEW  COMPARISON:  06/04/14  FINDINGS: A nasogastric catheter has been removed. A new feeding catheter is now seen just beyond the gastroesophageal junction. This could be advanced if desired. The cardiac shadow is stable. Left retrocardiac atelectasis is noted. No other focal abnormality is seen.  IMPRESSION: Feeding catheter just beyond the gastroesophageal junction.  These results will be called to the ordering clinician or representative by the Radiologist Assistant, and communication documented in the PACS or zVision Dashboard.   Electronically Signed   By: Alcide Clever M.D.   On: 06/07/2014 12:31     ASSESSMENT / PLAN:  PULMONARY OETT 1/18>>> A: VDRF due to AMS, hematemesis,  Aspirationpneumonia Mixed acidosis - mainly metabolic. Resolved.  P:   - SBTs but hold off extubation until mental status improves   CARDIOVASCULAR CVL L IJ TLC 1/18>>> A: Sinus tach with occasional Wide complex tachy - likely 2/2 to blood loss, sepsis, shock and NSTEMI-peak Tp 1.5 Septic shock - improved ,Off pressors. CAD Troponin elevation likely 2/2  to demand/shock Severe systolic CHF EF 25% on ECHO 06/06/14. ?Takotsubo appearance  P:  - per cardiology  Ok to hold off plavix if needed since his new stent on right Iliac is considered low risk. GI is ok to restart it.  - start coreg-  - consider repeating another ECHO when patient improves from current episode.May need to have ICD if EF remains this low.  RENAL A:  B/L crt 1.05. Currently ~1.4. Likely prerenal or ATN with acute blood loss vs septic shock.  Low mag, low K+, low phos.  Mixed acidosis - mainly metabolic, likely from lactic acidosis/sepsis. Almost  resolved.  P:   - Correct electrolytes as indicated.  GASTROINTESTINAL A:   LA class D esophagitis. this is the source of the bleeding per EGD report.    P:   - IV protonix now. Needs qdaily protonix indefinitely. - Hold anti-platelets. - SCD.  HEMATOLOGIC A:  Acute on chronic anemia - Baseline hgb ~ 12-13, currently down around 8-9. Likely from esophagitis seen on EGD. No more drop, stable now. - leukopenia - now normal WBC - thrombocytopenia chronic.  P:  -  Changed CBC to daily. Transfuse goal hgb <8 since has Cardiac disease.  INFECTIOUS A:  Tmax 102.8. PCT 27.30. PCT now trending down.  Aspiration pneumonia CXR showed left infiltrates which are improving. Fever1/22 P:   BCx2 1/18>>>ng UC 1/18>>>ng Sputum 1/18>>> growing GPC in pairs (likely strep pneum). CSF 1/21 >>  Cefepime 1/18 >>1/20 Flagyl 1/18 >>  Ceftriaxone 1/20 >> 1/24? Cont ceftriaxone for 7 days of tx. Stop  Flagyl. rechk pct for persistent fever ? Non infectious cause  ENDOCRINE A:  DM.   No adrenal insuf - cortisol (56.6) P:   - ISS. - stop steroid.  NEUROLOGIC A:  Acute encephalopathy likely from septic shock and blood loss not improving Agitation Likely opiate dependance since he is on large dose at home. P:   RASS goal: 0 - on prn fentanyl + precedex. - is on ativan prn since was on benzo at home. Would restart home xanax when  takes PO. Haldol for breakthrough agitation as well.- takes MS IR 15 TID at home.  -Neuro input -may need prt imaging   FAMILY  - Updates: Family updated bedside   Summary -Unclear cause of acute encephalopathy -this is impeding extubation, ? takatsubo Esophagitis   Care during the described time interval was provided by me and/or other providers on the critical care team.  I have reviewed this patient's available data, including medical history, events of note, physical examination and test results as part of my evaluation  CC time x 50m   Oretha Milch. MD

## 2014-06-08 NOTE — Progress Notes (Signed)
EEG completed; results pending.    

## 2014-06-09 ENCOUNTER — Inpatient Hospital Stay (HOSPITAL_COMMUNITY): Payer: Medicare Other

## 2014-06-09 DIAGNOSIS — I639 Cerebral infarction, unspecified: Secondary | ICD-10-CM

## 2014-06-09 DIAGNOSIS — G9340 Encephalopathy, unspecified: Secondary | ICD-10-CM

## 2014-06-09 DIAGNOSIS — I5021 Acute systolic (congestive) heart failure: Secondary | ICD-10-CM

## 2014-06-09 LAB — GLUCOSE, CAPILLARY
GLUCOSE-CAPILLARY: 189 mg/dL — AB (ref 70–99)
Glucose-Capillary: 142 mg/dL — ABNORMAL HIGH (ref 70–99)
Glucose-Capillary: 213 mg/dL — ABNORMAL HIGH (ref 70–99)
Glucose-Capillary: 226 mg/dL — ABNORMAL HIGH (ref 70–99)
Glucose-Capillary: 231 mg/dL — ABNORMAL HIGH (ref 70–99)
Glucose-Capillary: 231 mg/dL — ABNORMAL HIGH (ref 70–99)

## 2014-06-09 LAB — BASIC METABOLIC PANEL
ANION GAP: 7 (ref 5–15)
Anion gap: 9 (ref 5–15)
BUN: 38 mg/dL — AB (ref 6–23)
BUN: 38 mg/dL — AB (ref 6–23)
CALCIUM: 8.2 mg/dL — AB (ref 8.4–10.5)
CALCIUM: 8.3 mg/dL — AB (ref 8.4–10.5)
CO2: 22 mmol/L (ref 19–32)
CO2: 20 mmol/L (ref 19–32)
CREATININE: 1.24 mg/dL (ref 0.50–1.35)
Chloride: 116 mmol/L — ABNORMAL HIGH (ref 96–112)
Chloride: 118 mmol/L — ABNORMAL HIGH (ref 96–112)
Creatinine, Ser: 1.33 mg/dL (ref 0.50–1.35)
GFR calc non Af Amer: 54 mL/min — ABNORMAL LOW (ref >90)
GFR, EST AFRICAN AMERICAN: 57 mL/min — AB (ref >90)
GFR, EST AFRICAN AMERICAN: 62 mL/min — AB (ref >90)
GFR, EST NON AFRICAN AMERICAN: 49 mL/min — AB (ref >90)
Glucose, Bld: 264 mg/dL — ABNORMAL HIGH (ref 70–99)
Glucose, Bld: 260 mg/dL — ABNORMAL HIGH (ref 70–99)
Potassium: 2.8 mmol/L — ABNORMAL LOW (ref 3.5–5.1)
Potassium: 3.7 mmol/L (ref 3.5–5.1)
Sodium: 145 mmol/L (ref 135–145)
Sodium: 147 mmol/L — ABNORMAL HIGH (ref 135–145)

## 2014-06-09 LAB — CBC
HCT: 23.7 % — ABNORMAL LOW (ref 39.0–52.0)
Hemoglobin: 8.3 g/dL — ABNORMAL LOW (ref 13.0–17.0)
MCH: 31.6 pg (ref 26.0–34.0)
MCHC: 35 g/dL (ref 30.0–36.0)
MCV: 90.1 fL (ref 78.0–100.0)
Platelets: 137 10*3/uL — ABNORMAL LOW (ref 150–400)
RBC: 2.63 MIL/uL — ABNORMAL LOW (ref 4.22–5.81)
RDW: 14.7 % (ref 11.5–15.5)
WBC: 12.4 10*3/uL — AB (ref 4.0–10.5)

## 2014-06-09 LAB — PROCALCITONIN: Procalcitonin: 2.08 ng/mL

## 2014-06-09 MED ORDER — POTASSIUM CHLORIDE 10 MEQ/50ML IV SOLN
10.0000 meq | INTRAVENOUS | Status: AC
  Administered 2014-06-09 (×8): 10 meq via INTRAVENOUS
  Filled 2014-06-09 (×7): qty 50

## 2014-06-09 MED ORDER — LORAZEPAM 2 MG/ML IJ SOLN
0.5000 mg | INTRAMUSCULAR | Status: DC | PRN
  Administered 2014-06-09 – 2014-06-11 (×2): 1 mg via INTRAVENOUS
  Administered 2014-06-11: 0.5 mg via INTRAVENOUS
  Administered 2014-06-12 (×3): 1 mg via INTRAVENOUS
  Administered 2014-06-13: 0.5 mg via INTRAVENOUS
  Filled 2014-06-09 (×8): qty 1

## 2014-06-09 MED ORDER — MAGNESIUM SULFATE 2 GM/50ML IV SOLN
2.0000 g | Freq: Once | INTRAVENOUS | Status: AC
  Administered 2014-06-09: 2 g via INTRAVENOUS
  Filled 2014-06-09: qty 50

## 2014-06-09 MED ORDER — PANTOPRAZOLE SODIUM 40 MG IV SOLR
40.0000 mg | Freq: Two times a day (BID) | INTRAVENOUS | Status: DC
  Administered 2014-06-09 – 2014-06-18 (×19): 40 mg via INTRAVENOUS
  Filled 2014-06-09 (×23): qty 40

## 2014-06-09 MED ORDER — PANTOPRAZOLE SODIUM 40 MG PO PACK
40.0000 mg | PACK | Freq: Two times a day (BID) | ORAL | Status: DC
  Filled 2014-06-09 (×2): qty 20

## 2014-06-09 MED ORDER — CLOPIDOGREL BISULFATE 75 MG PO TABS
75.0000 mg | ORAL_TABLET | Freq: Every day | ORAL | Status: DC
  Administered 2014-06-09 – 2014-06-14 (×6): 75 mg via ORAL
  Filled 2014-06-09 (×10): qty 1

## 2014-06-09 NOTE — Progress Notes (Signed)
RT note-Placed back to full support due to increased VE and restlessness, resume wean as tolerated.

## 2014-06-09 NOTE — Progress Notes (Signed)
Subjective: Patient improved today and more alert.  Remains febrile.  Increasing WBC count.    Objective: Current vital signs: BP 132/72 mmHg  Pulse 79  Temp(Src) 101.5 F (38.6 C) (Core (Comment))  Resp 21  Wt 77.3 kg (170 lb 6.7 oz)  SpO2 100% Vital signs in last 24 hours: Temp:  [100.4 F (38 C)-102.1 F (38.9 C)] 101.5 F (38.6 C) (01/23 0630) Pulse Rate:  [78-120] 79 (01/23 0630) Resp:  [18-32] 21 (01/23 0630) BP: (125-148)/(65-83) 132/72 mmHg (01/23 0630) SpO2:  [100 %] 100 % (01/23 0630) FiO2 (%):  [40 %] 40 % (01/23 0820) Weight:  [77.3 kg (170 lb 6.7 oz)] 77.3 kg (170 lb 6.7 oz) (01/23 0400)  Intake/Output from previous day: 01/22 0701 - 01/23 0700 In: 2928 [I.V.:888; NG/GT:1590; IV Piggyback:450] Out: 1110 [Urine:1110] Intake/Output this shift:   Nutritional status: Diet NPO time specified  Neurologic Exam: Mental Status: Intubated but awake.  No speech.  Does not follow commands.  Questionable visual fixation.   Cranial Nerves: II: Discs flat bilaterally; no blink to threat bilaterally, pupils equal, round, reactive to light and accommodation III,IV, VI: right ptosis, oculocephalic maneuver intact V,VII: corneal reflex intact bilaterally VIII: Seems to alert to voice IX,X: gag reflex present XI: bilateral shoulder shrug XII: unable to assess due to intubation.  Motor: No spontaneous movement noted at this time  Sensory: no response to pain in bilateral UE or LE Deep Tendon Reflexes: 1+ and symmetric throughout UE no KJ or AJ   Lab Results: Basic Metabolic Panel:  Recent Labs Lab 06/04/14 1400 06/04/14 2342 06/05/14 0300 06/05/14 1700  06/06/14 1700 06/07/14 0450 06/07/14 1600 06/08/14 0500 06/09/14 0430  NA 141 143 141 141  < > 142 144 145 145 145  K 4.4 3.1* 3.7 3.7  < > 3.9 3.6 2.9* 3.5 2.8*  CL 103 115* 114* 115*  < > 115* 115* 112 114* 116*  CO2 23 26 20 22   < > 21 20 19 20 22   GLUCOSE 209* 132* 161* 106*  < > 148* 138* 144* 168*  264*  BUN 37* 32* 32* 27*  < > 24* 25* 26* 30* 38*  CREATININE 1.85* 1.43* 1.48* 1.48*  < > 1.65* 1.49* 1.56* 1.54* 1.33  CALCIUM 8.5 7.4* 7.8* 8.1*  < > 8.4 8.7 8.9 8.7 8.2*  MG 1.1* 1.5 1.4* 1.7  --   --  1.6  --   --   --   PHOS 2.4  --  1.6* 2.4  --   --  2.7  --   --   --   < > = values in this interval not displayed.  Liver Function Tests:  Recent Labs Lab 06/04/14 1149 06/04/14 1400 06/06/14 0420  AST 32 33 26  ALT 12 12 14   ALKPHOS 44 41 38*  BILITOT 1.1 0.8 0.6  PROT 4.5* 4.5* 3.9*  ALBUMIN 2.7* 2.6* 2.1*    Recent Labs Lab 06/04/14 2342  LIPASE 15  AMYLASE 143*    Recent Labs Lab 06/04/14 1400  AMMONIA 12    CBC:  Recent Labs Lab 06/04/14 1149  06/04/14 1400  06/05/14 0300  06/06/14 0420  06/06/14 1550 06/07/14 0029 06/07/14 0450 06/08/14 0500 06/09/14 0430  WBC 4.3  --  3.4*  < > 6.3  --  7.6  --   --   --  11.0* 10.9* 12.4*  NEUTROABS 3.3  --  2.5  --   --   --   --   --   --   --   --   --   --  HGB 13.0  < > 11.9*  < > 9.0*  < > 8.5*  < > 8.5* 8.1* 8.3* 8.4* 8.3*  HCT 39.2  < > 36.2*  < > 26.3*  < > 25.5*  < > 25.3* 24.3* 24.4* 24.2* 23.7*  MCV 97.0  --  96.3  < > 92.0  --  94.8  --   --   --  92.1 90.0 90.1  PLT 139*  --  130*  < > 101*  --  99*  --   --   --  112* 130* 137*  < > = values in this interval not displayed.  Cardiac Enzymes:  Recent Labs Lab 06/05/14 1100 06/06/14 0008 06/06/14 1140 06/07/14 0008 06/07/14 0450  TROPONINI 0.56* 1.43* 1.51* 1.14* 1.00*    Lipid Panel: No results for input(s): CHOL, TRIG, HDL, CHOLHDL, VLDL, LDLCALC in the last 168 hours.  CBG:  Recent Labs Lab 06/08/14 1653 06/08/14 2001 06/09/14 0021 06/09/14 0358 06/09/14 0711  GLUCAP 192* 218* 231* 226* 189*    Microbiology: Results for orders placed or performed during the hospital encounter of 06/04/14  Blood culture (routine x 2)     Status: None (Preliminary result)   Collection Time: 06/04/14 11:50 AM  Result Value Ref Range  Status   Specimen Description BLOOD HAND LEFT  Final   Special Requests BOTTLES DRAWN AEROBIC AND ANAEROBIC 2CC  Final   Culture   Final           BLOOD CULTURE RECEIVED NO GROWTH TO DATE CULTURE WILL BE HELD FOR 5 DAYS BEFORE ISSUING A FINAL NEGATIVE REPORT Performed at [facility]    Report Status PENDING  Incomplete  Blood culture (routine x 2)     Status: None (Preliminary result)   Collection Time: 06/04/14 11:54 AM  Result Value Ref Range Status   Specimen Description BLOOD LEFT FOREARM  Final   Special Requests BOTTLES DRAWN AEROBIC ONLY 2CC  Final   Culture   Final           BLOOD CULTURE RECEIVED NO GROWTH TO DATE CULTURE WILL BE HELD FOR 5 DAYS BEFORE ISSUING A FINAL NEGATIVE REPORT Performed at [facility]    Report Status PENDING  Incomplete  Culture, blood (routine x 2)     Status: None (Preliminary result)   Collection Time: 06/04/14  1:50 PM  Result Value Ref Range Status   Specimen Description BLOOD LEFT ANTECUBITAL  Final   Special Requests BOTTLES DRAWN AEROBIC AND ANAEROBIC 10MLS  Final   Culture   Final           BLOOD CULTURE RECEIVED NO GROWTH TO DATE CULTURE WILL BE HELD FOR 5 DAYS BEFORE ISSUING A FINAL NEGATIVE REPORT Note: Culture results may be compromised due to an excessive volume of blood received in culture bottles. Performed at [facility]    Report Status PENDING  Incomplete  Culture, blood (routine x 2)     Status: None (Preliminary result)   Collection Time: 06/04/14  2:00 PM  Result Value Ref Range Status   Specimen Description BLOOD LEFT HAND  Final   Special Requests BOTTLES DRAWN AEROBIC AND ANAEROBIC 10MLS  Final   Culture   Final           BLOOD CULTURE RECEIVED NO GROWTH TO DATE CULTURE WILL BE HELD FOR 5 DAYS BEFORE ISSUING A FINAL NEGATIVE REPORT Performed at [facility]    Report Status PENDING  Incomplete  MRSA PCR Screening     Status: None   Collection Time: 06/04/14  4:20 PM  Result Value Ref  Range Status   MRSA by PCR NEGATIVE NEGATIVE Final    Comment:        The GeneXpert MRSA Assay (FDA approved for NASAL specimens only), is one component of a comprehensive MRSA colonization surveillance program. It is not intended to diagnose MRSA infection nor to guide or monitor treatment for MRSA infections.   Urine culture     Status: None   Collection Time: 06/04/14  4:46 PM  Result Value Ref Range Status   Specimen Description URINE, CATHETERIZED  Final   Special Requests Normal  Final   Colony Count NO GROWTH Performed at [facility]   Final   Culture NO GROWTH Performed at [facility]   Final   Report Status 06/05/2014 FINAL  Final  Culture, respiratory (NON-Expectorated)     Status: None   Collection Time: 06/04/14  4:46 PM  Result Value Ref Range Status   Specimen Description TRACHEAL ASPIRATE  Final   Special Requests NONE  Final   Gram Stain   Final    FEW WBC PRESENT, PREDOMINANTLY MONONUCLEAR RARE SQUAMOUS EPITHELIAL CELLS PRESENT FEW GRAM POSITIVE COCCI IN PAIRS IN CLUSTERS MODERATE YEAST Performed at [facility]    Culture   Final    Non-Pathogenic Oropharyngeal-type Flora Isolated. Performed at [facility]    Report Status 06/07/2014 FINAL  Final  CSF culture     Status: None (Preliminary result)   Collection Time: 06/07/14  3:00 PM  Result Value Ref Range Status   Specimen Description CSF  Final   Special Requests Normal  Final   Gram Stain   Final    WBC PRESENT, PREDOMINANTLY MONONUCLEAR NO ORGANISMS SEEN CYTOSPIN Performed at [facility] Performed at [facility]    Culture NO GROWTH Performed at [facility]   Final   Report Status PENDING  Incomplete  Gram stain - STAT with CSF culture     Status: None   Collection Time: 06/07/14  3:00 PM  Result Value Ref Range Status   Specimen Description CSF  Final   Special Requests Normal  Final   Gram Stain   Final    CYTOSPIN  PREP WBC PRESENT, PREDOMINANTLY MONONUCLEAR NO ORGANISMS SEEN    Report Status 06/07/2014 FINAL  Final  MRSA PCR Screening     Status: None   Collection Time: 06/07/14  9:51 PM  Result Value Ref Range Status   MRSA by PCR NEGATIVE NEGATIVE Final    Comment:        The GeneXpert MRSA Assay (FDA approved for NASAL specimens only), is one component of a comprehensive MRSA colonization surveillance program. It is not intended to diagnose MRSA infection nor to guide or monitor treatment for MRSA infections.     Coagulation Studies: No results for input(s): LABPROT, INR in the last 72 hours.  Imaging: Mr Brain Wo Contrast  06/08/2014   CLINICAL DATA:  Encephalopathy  EXAM: MRI HEAD WITHOUT CONTRAST  TECHNIQUE: Multiplanar, multiecho pulse sequences of the brain and surrounding structures were obtained without intravenous contrast.  COMPARISON:  CT head 06/04/2014  FINDINGS: Image quality degraded by mild motion.  Mild atrophy.  Negative for hydrocephalus.  5 mm area of restricted diffusion left occipital lobe consistent with acute infarct. No other areas of acute infarct identified  Negative for hemorrhage or fluid collection  Negative  for mass lesion.  No significant chronic ischemic changes are present.  Sagittal T1 images inadvertently not obtained.  IMPRESSION: Small 5 mm area of acute infarct in the left occipital lobe. Otherwise negative.   Electronically Signed   By: Marlan Palau M.D.   On: 06/08/2014 16:44   Dg Chest Port 1 View  06/09/2014   CLINICAL DATA:  Acute respiratory failure  EXAM: PORTABLE CHEST - 1 VIEW  COMPARISON:  Chest x-ray from yesterday  FINDINGS: Endotracheal tube remains between the clavicular heads and carina. The left IJ catheter tip is in stable position at the SVC. The feeding tube continues into the stomach at least.  Bibasilar and left upper lung opacities persist. Diffuse interstitial opacities have gradually cleared, as has the left mid lung. There may be  a small left pleural effusion. No pneumothorax.  IMPRESSION: 1. Tubes and central line remain in good position. 2. Continued improvement in aeration. Residual opacities could reflect pneumonia or atelectasis.   Electronically Signed   By: Tiburcio Pea M.D.   On: 06/09/2014 05:16   Dg Chest Port 1 View  06/08/2014   CLINICAL DATA:  Shortness of breath  EXAM: PORTABLE CHEST - 1 VIEW  COMPARISON:  06/07/2014  FINDINGS: Endotracheal tube tip at the clavicular heads. There is a new feeding tube which continues below the diaphragm, tip at the stomach based on KUB from yesterday. Stable left IJ catheter, tip at the upper cavoatrial junction.  Normal heart size and mediastinal contours. Bilateral interstitial and airspace opacities are unchanged, asymmetric to the left mid and upper lung.  IMPRESSION: 1. Stable positioning of endotracheal tube and left IJ catheter. Feeding tube tip noted KUB from yesterday. 2. Unchanged asymmetric pulmonary edema versus pneumonia.   Electronically Signed   By: Tiburcio Pea M.D.   On: 06/08/2014 06:20   Dg Chest Port 1 View  06/07/2014   CLINICAL DATA:  Subsequent evaluation for respiratory failure  EXAM: PORTABLE CHEST - 1 VIEW  COMPARISON:  06/06/2014  FINDINGS: No change in the position of left central line or endotracheal tube.  Heart size upper normal and stable. Mild vascular congestion, with persistent peribronchial cuffing and scattered foci of bilateral interstitial and alveolar opacities. When compared to the prior study, bilateral opacities are slightly worse. Tiny left effusion not significantly different.  IMPRESSION: Findings again consistent with moderate pulmonary edema slightly worse when compared to the prior study.   Electronically Signed   By: Esperanza Heir M.D.   On: 06/07/2014 10:47   Dg Abd Portable 1v  06/09/2014   CLINICAL DATA:  Encounter for nasogastric tube placement.  EXAM: PORTABLE ABDOMEN - 1 VIEW  COMPARISON:  06/04/2014  FINDINGS: Feeding  tube tip is in the proximal stomach.  The bowel gas pattern is nonobstructive. Cholecystectomy clips are noted. A pelvic themistor is partly visible.  IMPRESSION: Feeding tube tip in the proximal stomach.   Electronically Signed   By: Tiburcio Pea M.D.   On: 06/09/2014 05:17   Dg Abd Portable 1v  06/07/2014   CLINICAL DATA:  Status post feeding catheter placement  EXAM: PORTABLE ABDOMEN - 1 VIEW  COMPARISON:  06/04/14  FINDINGS: A nasogastric catheter has been removed. A new feeding catheter is now seen just beyond the gastroesophageal junction. This could be advanced if desired. The cardiac shadow is stable. Left retrocardiac atelectasis is noted. No other focal abnormality is seen.  IMPRESSION: Feeding catheter just beyond the gastroesophageal junction.  These results will be called to  the ordering clinician or representative by the Radiologist Assistant, and communication documented in the PACS or zVision Dashboard.   Electronically Signed   By: Alcide Clever M.D.   On: 06/07/2014 12:31    Medications:  I have reviewed the patient's current medications. Scheduled: . antiseptic oral rinse  7 mL Mouth Rinse QID  . carvedilol  6.25 mg Oral BID WC  . cefTRIAXone (ROCEPHIN)  IV  1 g Intravenous Q24H  . chlorhexidine  15 mL Mouth Rinse BID  . enalapril  2.5 mg Oral BID  . insulin aspart  2-6 Units Subcutaneous 6 times per day  . metronidazole  500 mg Intravenous Q8H  . pantoprazole (PROTONIX) IV  40 mg Intravenous Q12H  . potassium chloride  10 mEq Intravenous Q1H  . sodium chloride  1,000 mL Intravenous Once    Assessment/Plan: Patient improved today.  MRI of the brain personally reviewed and shows a small acute left occipital infarct.  This is so small that it is no the cause of the patient's mental status changes.  EEG is slow with triphasic waves.  This is consistent with the patient's sepsis, elevated white blood cell count and fever.  There was no evidence of seizures.   CVA may be related  to cardiomyopathy.  Recent echocardiogram (06/06/2014) shows no evidence of intracardiac masses or thrombi.    Recommendations: 1.  Carotid dopplers once extubated 2.  Fasting lipid panel, A1c 3.  Will continue to follow with you.  4.  ASA 300mg  daily    LOS: 5 days   Thana Farr, MD [facility] [phone] 06/09/2014  10:01 AM

## 2014-06-09 NOTE — Progress Notes (Signed)
RRT note-weaned on and off today with PS 10-14 for a total of 3 hours, restless, but alert. requiring sedation this afternoon.

## 2014-06-09 NOTE — Progress Notes (Signed)
SUBJECTIVE:  Awake.  Intubated.  He denies chest pain.     PHYSICAL EXAM Filed Vitals:   06/09/14 0500 06/09/14 0530 06/09/14 0600 06/09/14 0630  BP: 125/66 133/69 129/65 132/72  Pulse: 82 80 79 79  Temp: 101.7 F (38.7 C) 101.6 F (38.7 C) 101.5 F (38.6 C) 101.5 F (38.6 C)  TempSrc:      Resp: Weight:      SpO2: 100% 100% 100% 100%   General:  No distress, on sedation Lungs:  Decreased breath sounds Heart:  RRR Abdomen:  Decreased bowel sounds Extremities:  No edema.    LABS: Lab Results  Component Value Date   TROPONINI 1.00* 06/07/2014   Results for orders placed or performed during the hospital encounter of 06/04/14 (from the past 24 hour(s))  Glucose, capillary     Status: Abnormal   Collection Time: 06/08/14 11:34 AM  Result Value Ref Range   Glucose-Capillary 229 (H) 70 - 99 mg/dL  Glucose, capillary     Status: Abnormal   Collection Time: 06/08/14  4:53 PM  Result Value Ref Range   Glucose-Capillary 192 (H) 70 - 99 mg/dL  Glucose, capillary     Status: Abnormal   Collection Time: 06/08/14  8:01 PM  Result Value Ref Range   Glucose-Capillary 218 (H) 70 - 99 mg/dL   Comment 1 Documented in Chart    Comment 2 Notify RN   Glucose, capillary     Status: Abnormal   Collection Time: 06/09/14 12:21 AM  Result Value Ref Range   Glucose-Capillary 231 (H) 70 - 99 mg/dL   Comment 1 Documented in Chart    Comment 2 Notify RN   Glucose, capillary     Status: Abnormal   Collection Time: 06/09/14  3:58 AM  Result Value Ref Range   Glucose-Capillary 226 (H) 70 - 99 mg/dL  Basic metabolic panel     Status: Abnormal   Collection Time: 06/09/14  4:30 AM  Result Value Ref Range   Sodium 145 135 - 145 mmol/L   Potassium 2.8 (L) 3.5 - 5.1 mmol/L   Chloride 116 (H) 96 - 112 mmol/L   CO2 22 19 - 32 mmol/L   Glucose, Bld 264 (H) 70 - 99 mg/dL   BUN 38 (H) 6 - 23 mg/dL   Creatinine, Ser 4.54 0.50 - 1.35 mg/dL   Calcium 8.2 (L) 8.4 - 10.5 mg/dL   GFR  calc non Af Amer 49 (L) >90 mL/min   GFR calc Af Amer 57 (L) >90 mL/min   Anion gap 7 5 - 15  CBC     Status: Abnormal   Collection Time: 06/09/14  4:30 AM  Result Value Ref Range   WBC 12.4 (H) 4.0 - 10.5 K/uL   RBC 2.63 (L) 4.22 - 5.81 MIL/uL   Hemoglobin 8.3 (L) 13.0 - 17.0 g/dL   HCT 09.8 (L) 11.9 - 14.7 %   MCV 90.1 78.0 - 100.0 fL   MCH 31.6 26.0 - 34.0 pg   MCHC 35.0 30.0 - 36.0 g/dL   RDW 82.9 56.2 - 13.0 %   Platelets 137 (L) 150 - 400 K/uL  Procalcitonin     Status: None   Collection Time: 06/09/14  4:30 AM  Result Value Ref Range   Procalcitonin 2.08 ng/mL  Glucose, capillary     Status: Abnormal   Collection Time: 06/09/14  7:11 AM  Result Value Ref Range   Glucose-Capillary 189 (  H) 70 - 99 mg/dL    Intake/Output Summary (Last 24 hours) at 06/09/14 1127 Last data filed at 06/09/14 [id number]  Gross per 24 hour  Intake 2450.8 ml  Output   1045 ml  Net 1405.8 ml     ASSESSMENT AND PLAN:  CARDIOMYOPATHY:  Suspect not related to new acute coronary event.  CVP is 7 - 9.  Coreg increased and low dose enalapril started yesterday.    No change in meds today.    ELEVATED TROPONIN:  Probably demand ischemia.  Defer further ischemia work up until acute problems have improved.    Fayrene FearingJames [facility] 06/09/2014 11:27 AM

## 2014-06-09 NOTE — Progress Notes (Signed)
RT note-Placed on wean, 5/5, failed increased PS to 10, RR after 15 min 36, changed back to full support. Patient is more alert than yesterday, will continue to attempt to wean.

## 2014-06-09 NOTE — Progress Notes (Signed)
PULMONARY / CRITICAL CARE MEDICINE   Name: Marvin Leon MRN: [id number] DOB: 1932/09/20    ADMISSION DATE:  06/04/2014 CONSULTATION DATE:  06/04/2014  REFERRING MD :  EDP  CHIEF COMPLAINT:  VDRF and upper GI bleed  INITIAL PRESENTATION: 81 year old male hx of arthritis, GERD, HLD, HTN, CAD s/p 3 stents, emphysema, PAD , DMII on daily ASA and plavix since 02/22/14 since DES  iliac stent placement 02/2014, but no NSAID use who presents to the hospital with hematemesis and AMS.  In the ED the patient was noted to not be protecting his airway and was intubated for airway protection.  OGT was placed with 1500 ml of coffee ground black material was noted.  PCCM was called to admit to the ICU.  No recent etoh history. Normal cscope and endoscope 2009.  STUDIES:  1/18  NGT lavaged with only coffee grounds, no active bleeding. 1/18  CT - mild atrophy.  1/18  KUB - normal. 1/19  EGD. LA class D esophagitis. this is the source of the bleeding.   SIGNIFICANT EVENTS: 1/18  UGI bleed. hgb dropped. 1/19  EGD, weaned off pressor.  1/20  Failed extubation. Off pressors 1/22  Agitation overnight, on precedex / fentanyl, low grade fevers  SUBJECTIVE:  1/23  Failed SBT with low lung volumes, more alert than 1/22.  Tmax 101.5  VITAL SIGNS: Temp:  [100.4 F (38 C)-102.1 F (38.9 C)] 101.5 F (38.6 C) (01/23 0630) Pulse Rate:  [78-120] 79 (01/23 0630) Resp:  [18-32] 21 (01/23 0630) BP: (125-148)/(65-83) 132/72 mmHg (01/23 0630) SpO2:  [100 %] 100 % (01/23 0630) FiO2 (%):  [40 %] 40 % (01/23 0820) Weight:  [170 lb 6.7 oz (77.3 kg)] 170 lb 6.7 oz (77.3 kg) (01/23 0400)   HEMODYNAMICS: CVP:  [7 mmHg-12 mmHg] 7 mmHg   VENTILATOR SETTINGS: Vent Mode:  [-] PSV;CPAP FiO2 (%):  [40 %] 40 % Set Rate:  [16 bmp] 16 bmp Vt Set:  [600 mL] 600 mL PEEP:  [5 cmH20] 5 cmH20 Pressure Support:  [5 cmH20] 5 cmH20 Plateau Pressure:  [19 cmH20-21 cmH20] 21 cmH20   INTAKE / OUTPUT:  Intake/Output Summary  (Last 24 hours) at 06/09/14 1001 Last data filed at 06/09/14 [id number]  Gross per 24 hour  Intake 2542.7 ml  Output   1045 ml  Net 1497.7 ml    PHYSICAL EXAMINATION: General:  Intubated, sedated. NAD. Neuro: opens eyes to voice, tracks, follows simple commands HEENT:  /AT Cardiovascular:  s1s2 rrr, no m/r/g Lungs:  CTAB Abdomen:  Soft, NT, ND and +BS Musculoskeletal: no acute deformities Skin:  Warm/dry, no edema   LABS:  CBC  Recent Labs Lab 06/07/14 0450 06/08/14 0500 06/09/14 0430  WBC 11.0* 10.9* 12.4*  HGB 8.3* 8.4* 8.3*  HCT 24.4* 24.2* 23.7*  PLT 112* 130* 137*   Coag's  Recent Labs Lab 06/04/14 1149 06/04/14 1400  APTT  --  30  INR 1.28 1.29   BMET  Recent Labs Lab 06/07/14 1600 06/08/14 0500 06/09/14 0430  NA 145 145 145  K 2.9* 3.5 2.8*  CL 112 114* 116*  CO2 19 20 22   BUN 26* 30* 38*  CREATININE 1.56* 1.54* 1.33  GLUCOSE 144* 168* 264*   Electrolytes  Recent Labs Lab 06/05/14 0300 06/05/14 1700  06/07/14 0450 06/07/14 1600 06/08/14 0500 06/09/14 0430  CALCIUM 7.8* 8.1*  < > 8.7 8.9 8.7 8.2*  MG 1.4* 1.7  --  1.6  --   --   --  PHOS 1.6* 2.4  --  2.7  --   --   --   < > = values in this interval not displayed.   Sepsis Markers  Recent Labs Lab 06/04/14 1150 06/04/14 1400 06/04/14 2342  06/06/14 0420 06/08/14 0800 06/09/14 0430  LATICACIDVEN 6.9* 6.4* 2.1  --   --   --   --   PROCALCITON  --  9.08  --   < > 11.47 2.70 2.08  < > = values in this interval not displayed.   ABG  Recent Labs Lab 06/04/14 1247 06/04/14 1451 06/05/14 0640  PHART 7.280* 7.330* 7.381  PCO2ART 46.4* 39.1 33.4*  PO2ART 323.0* 60.0* 127.0*   Liver Enzymes   Recent Labs Lab 06/04/14 1149 06/04/14 1400 06/06/14 0420  AST 32 33 26  ALT ALKPHOS 44 41 38*  BILITOT 1.1 0.8 0.6  ALBUMIN 2.7* 2.6* 2.1*   Cardiac Enzymes  Recent Labs Lab 06/06/14 1140 06/07/14 0008 06/07/14 0450  TROPONINI 1.51* 1.14* 1.00*    Glucose  Recent Labs Lab 06/08/14 1134 06/08/14 1653 06/08/14 2001 06/09/14 0021 06/09/14 0358 06/09/14 0711  GLUCAP 229* 192* 218* 231* 226* 189*    Imaging Mr Brain Wo Contrast  06/08/2014   CLINICAL DATA:  Encephalopathy  EXAM: MRI HEAD WITHOUT CONTRAST  TECHNIQUE: Multiplanar, multiecho pulse sequences of the brain and surrounding structures were obtained without intravenous contrast.  COMPARISON:  CT head 06/04/2014  FINDINGS: Image quality degraded by mild motion.  Mild atrophy.  Negative for hydrocephalus.  5 mm area of restricted diffusion left occipital lobe consistent with acute infarct. No other areas of acute infarct identified  Negative for hemorrhage or fluid collection  Negative for mass lesion.  No significant chronic ischemic changes are present.  Sagittal T1 images inadvertently not obtained.  IMPRESSION: Small 5 mm area of acute infarct in the left occipital lobe. Otherwise negative.   Electronically Signed   By: Marlan Palau M.D.   On: 06/08/2014 16:44   Dg Chest Port 1 View  06/08/2014   CLINICAL DATA:  Shortness of breath  EXAM: PORTABLE CHEST - 1 VIEW  COMPARISON:  06/07/2014  FINDINGS: Endotracheal tube tip at the clavicular heads. There is a new feeding tube which continues below the diaphragm, tip at the stomach based on KUB from yesterday. Stable left IJ catheter, tip at the upper cavoatrial junction.  Normal heart size and mediastinal contours. Bilateral interstitial and airspace opacities are unchanged, asymmetric to the left mid and upper lung.  IMPRESSION: 1. Stable positioning of endotracheal tube and left IJ catheter. Feeding tube tip noted KUB from yesterday. 2. Unchanged asymmetric pulmonary edema versus pneumonia.   Electronically Signed   By: Tiburcio Pea M.D.   On: 06/08/2014 06:20     ASSESSMENT / PLAN:  PULMONARY OETT 1/18>>> A:  VDRF - due to AMS, hematemesis Aspiration pneumonia Mixed acidosis - mainly metabolic. Resolved.  P:   Daily  SBT / WUA  Trend CXR  CARDIOVASCULAR L IJ TLC 1/18 >> A:  Sinus Tachycardia -  with occasional wide complex tachy - likely 2/2 to blood loss, sepsis, shock and NSTEMI with peak Tp 1.5 Septic shock - improved, off pressors CAD Troponin elevation - likely 2/2 to demand/shock Severe systolic CHF EF 25% on ECHO 06/06/14. ?Takotsubo appearance P:  Resume plavix  Continue coreg 6.25 mg BID, vasotec 2.5 mg BID Consider repeating another ECHO when patient improves from current episode. May need to have ICD if  EF remains this low.  RENAL A:   Acute on Chronic Renal Failure - baseline sr cr ~ 1.4,  Acute rise with acute blood loss vs septic shock.  Hypomagnesemia  Hypokalemia  Hypophosphatemia  Mixed acidosis - mainly metabolic, likely from lactic acidosis/sepsis. Resolved.   P:   Trend BMP Replace electrolytes as indicated  KCL x 8 per ICU protocol 1/23 Mg 2gm x1 1/23 Repeat BMP at 1500   GASTROINTESTINAL A:   LA class D esophagitis - identified source of the bleeding per EGD report.  P:   Protonix BID, needs daily indefinitely  Resume plavix 1/23, monitor closely for new bleeding   HEMATOLOGIC A:   Acute on Chronic Anemia - baseline hgb ~ 12-13, likely from esophagitis seen on EGD. No more drop, stable now. Chronic Thrombocytopenia    P:  CBC daily  Transfuse goal hgb <8 since has Cardiac disease. DVT:  SCD's   INFECTIOUS A:  Fever - Tmax 102.8. PCT 27.30. PCT now trending down.  Aspiration pneumonia - CXR with improving infiltrates P:   BCx2 1/18 >>  UC 1/18 >> negative Sputum 1/18 >> negative  CSF 1/21 >>  Cefepime 1/18 >>1/20 Flagyl 1/18 >> 1/23 Ceftriaxone 1/20 >> 1/24, stop date added (total 7 days abx)  PCT down to 2 from 27  ENDOCRINE A:   DM.   AI ruled out P:   SSI    NEUROLOGIC A:   Acute encephalopathy - in setting of shock, blood loss.  Improvement 1/23.   Agitation - resolved.  Likely opiate dependance since he is on large dose at  home. P:   RASS goal: 0 Precedex + PRN Fentanyl  Ativan PRN since was on benzo at home. Would restart home xanax when takes PO.  D/c haldol   Mobilize / PT as able    FAMILY  - Updates: no family available at bedside 1/23.      Canary Brim, NP-C [facility] Pgr: [phone] or [phone]   Attending:   I have seen and examined the patient with nurse practitioner/resident and agree with the note above.   On my exam he was much more awake and alert than what has been described, he was following commands.  However he failed SBT this AM.  Lungs were clear for me  Would like to repeat SBT, attempt extubation if able. MRI results reviewed  Appreciate cardiology, neurology  My cc time is 35 minutes  Heber Bettendorf, MD [facility] Pager: [phone] Cell: [phone] If no response, call [phone]

## 2014-06-10 ENCOUNTER — Inpatient Hospital Stay (HOSPITAL_COMMUNITY): Payer: Medicare Other

## 2014-06-10 DIAGNOSIS — N170 Acute kidney failure with tubular necrosis: Secondary | ICD-10-CM

## 2014-06-10 LAB — CULTURE, BLOOD (ROUTINE X 2)
CULTURE: NO GROWTH
Culture: NO GROWTH
Culture: NO GROWTH
Culture: NO GROWTH

## 2014-06-10 LAB — CSF CULTURE W GRAM STAIN
Culture: NO GROWTH
Special Requests: NORMAL

## 2014-06-10 LAB — GLUCOSE, CAPILLARY
GLUCOSE-CAPILLARY: 187 mg/dL — AB (ref 70–99)
GLUCOSE-CAPILLARY: 202 mg/dL — AB (ref 70–99)
GLUCOSE-CAPILLARY: 206 mg/dL — AB (ref 70–99)
Glucose-Capillary: 201 mg/dL — ABNORMAL HIGH (ref 70–99)
Glucose-Capillary: 159 mg/dL — ABNORMAL HIGH (ref 70–99)
Glucose-Capillary: 176 mg/dL — ABNORMAL HIGH (ref 70–99)

## 2014-06-10 LAB — LIPID PANEL
CHOLESTEROL: 63 mg/dL (ref 0–200)
HDL: 6 mg/dL — AB (ref >39)
LDL Cholesterol: 16 mg/dL (ref 0–99)
Total CHOL/HDL Ratio: 10.5 RATIO
Triglycerides: 203 mg/dL — ABNORMAL HIGH (ref <150)
VLDL: 41 mg/dL — AB (ref 0–40)

## 2014-06-10 LAB — BASIC METABOLIC PANEL
Anion gap: 8 (ref 5–15)
BUN: 37 mg/dL — AB (ref 6–23)
CO2: 22 mmol/L (ref 19–32)
Calcium: 8.2 mg/dL — ABNORMAL LOW (ref 8.4–10.5)
Chloride: 118 mmol/L — ABNORMAL HIGH (ref 96–112)
Creatinine, Ser: 1.18 mg/dL (ref 0.50–1.35)
GFR calc Af Amer: 66 mL/min — ABNORMAL LOW (ref >90)
GFR calc non Af Amer: 57 mL/min — ABNORMAL LOW (ref >90)
Glucose, Bld: 241 mg/dL — ABNORMAL HIGH (ref 70–99)
Potassium: 3.4 mmol/L — ABNORMAL LOW (ref 3.5–5.1)
Sodium: 148 mmol/L — ABNORMAL HIGH (ref 135–145)

## 2014-06-10 LAB — HEMOGLOBIN A1C
HEMOGLOBIN A1C: 6.4 % — AB (ref <5.7)
MEAN PLASMA GLUCOSE: 137 mg/dL — AB (ref <117)

## 2014-06-10 LAB — CBC
HCT: 23.2 % — ABNORMAL LOW (ref 39.0–52.0)
HEMOGLOBIN: 8.1 g/dL — AB (ref 13.0–17.0)
MCH: 31.4 pg (ref 26.0–34.0)
MCHC: 34.9 g/dL (ref 30.0–36.0)
MCV: 89.9 fL (ref 78.0–100.0)
Platelets: 135 10*3/uL — ABNORMAL LOW (ref 150–400)
RBC: 2.58 MIL/uL — ABNORMAL LOW (ref 4.22–5.81)
RDW: 14.9 % (ref 11.5–15.5)
WBC: 17.4 10*3/uL — AB (ref 4.0–10.5)

## 2014-06-10 LAB — MAGNESIUM: Magnesium: 1.7 mg/dL (ref 1.5–2.5)

## 2014-06-10 LAB — CSF CULTURE

## 2014-06-10 LAB — PHOSPHORUS: PHOSPHORUS: 1.7 mg/dL — AB (ref 2.3–4.6)

## 2014-06-10 LAB — PROCALCITONIN: PROCALCITONIN: 1.24 ng/mL

## 2014-06-10 MED ORDER — POTASSIUM CHLORIDE CRYS ER 20 MEQ PO TBCR
20.0000 meq | EXTENDED_RELEASE_TABLET | ORAL | Status: AC
  Administered 2014-06-10 (×2): 20 meq via ORAL
  Filled 2014-06-10 (×2): qty 1

## 2014-06-10 MED ORDER — ARFORMOTEROL TARTRATE 15 MCG/2ML IN NEBU
15.0000 ug | INHALATION_SOLUTION | Freq: Two times a day (BID) | RESPIRATORY_TRACT | Status: DC
  Administered 2014-06-10 – 2014-06-18 (×17): 15 ug via RESPIRATORY_TRACT
  Filled 2014-06-10 (×20): qty 2

## 2014-06-10 MED ORDER — MAGNESIUM SULFATE 2 GM/50ML IV SOLN
2.0000 g | Freq: Once | INTRAVENOUS | Status: AC
  Administered 2014-06-10: 2 g via INTRAVENOUS
  Filled 2014-06-10: qty 50

## 2014-06-10 MED ORDER — CARVEDILOL 12.5 MG PO TABS
12.5000 mg | ORAL_TABLET | Freq: Two times a day (BID) | ORAL | Status: DC
  Administered 2014-06-10 – 2014-06-11 (×2): 12.5 mg via ORAL
  Filled 2014-06-10 (×4): qty 1

## 2014-06-10 MED ORDER — SODIUM CHLORIDE 0.9 % IV SOLN
20.0000 mmol | Freq: Once | INTRAVENOUS | Status: AC
  Administered 2014-06-10: 20 mmol via INTRAVENOUS
  Filled 2014-06-10: qty 6.67

## 2014-06-10 MED ORDER — BUDESONIDE 0.5 MG/2ML IN SUSP
0.5000 mg | Freq: Two times a day (BID) | RESPIRATORY_TRACT | Status: DC
  Administered 2014-06-10 – 2014-06-18 (×17): 0.5 mg via RESPIRATORY_TRACT
  Filled 2014-06-10 (×19): qty 2

## 2014-06-10 MED ORDER — IPRATROPIUM-ALBUTEROL 0.5-2.5 (3) MG/3ML IN SOLN
3.0000 mL | RESPIRATORY_TRACT | Status: DC | PRN
  Administered 2014-06-12: 3 mL via RESPIRATORY_TRACT
  Filled 2014-06-10: qty 3

## 2014-06-10 MED ORDER — DOCUSATE SODIUM 50 MG/5ML PO LIQD
100.0000 mg | Freq: Every day | ORAL | Status: DC
  Administered 2014-06-10 – 2014-06-14 (×5): 100 mg
  Filled 2014-06-10 (×9): qty 10

## 2014-06-10 MED ORDER — SODIUM CHLORIDE 0.9 % IJ SOLN
10.0000 mL | Freq: Two times a day (BID) | INTRAMUSCULAR | Status: DC
  Administered 2014-06-10 – 2014-06-17 (×12): 10 mL

## 2014-06-10 MED ORDER — SODIUM CHLORIDE 0.9 % IJ SOLN: 10.0000 mL | INTRAMUSCULAR | Status: DC | PRN

## 2014-06-10 NOTE — Progress Notes (Signed)
Subjective: Patient continues to improve and be more alert.  Follows commands.  Objective: Current vital signs: BP 141/82 mmHg  Pulse 118  Temp(Src) 98.5 F (36.9 C) (Core (Comment))  Resp 25  Wt 77.6 kg (171 lb 1.2 oz)  SpO2 100% Vital signs in last 24 hours: Temp:  [98.5 F (36.9 C)-100.4 F (38 C)] 98.5 F (36.9 C) (01/24 1100) Pulse Rate:  [74-118] 118 (01/24 1100) Resp:  [21-38] 25 (01/24 1100) BP: (118-147)/(60-85) 141/82 mmHg (01/24 1100) SpO2:  [98 %-100 %] 100 % (01/24 1100) FiO2 (%):  [40 %] 40 % (01/24 1158) Weight:  [77.6 kg (171 lb 1.2 oz)] 77.6 kg (171 lb 1.2 oz) (01/24 0414)  Intake/Output from previous day: 01/23 0701 - 01/24 0700 In: 2480.2 [I.V.:593.6; NG/GT:1380; IV Piggyback:506.7] Out: 1545 [Urine:1545] Intake/Output this shift:   Nutritional status: Diet NPO time specified  Neurologic Exam: Mental Status: Intubated but awake. No speech. Visually tracks.  Nods head to questioning.  Follows commands.    Cranial Nerves: II: Discs flat bilaterally; Blinks to threat bilaterally, pupils equal, round, reactive to light and accommodation III,IV, VI: mild right ptosis, oculocephalic maneuver intact V,VII: corneal reflex intact bilaterally VIII: Responds to verbal stimuli IX,X: gag reflex present XI: bilateral shoulder shrug XII: unable to assess due to intubation.  Motor: Moves all extremities to command with no focal weakness noted.   Sensory: Responds to light stimuli in all extremities Deep Tendon Reflexes: 1+ in the upper extremities and absent in the lower extremities.      Lab Results: Basic Metabolic Panel:  Recent Labs Lab 06/04/14 1400 06/04/14 2342 06/05/14 0300 06/05/14 1700  06/07/14 0450 06/07/14 1600 06/08/14 0500 06/09/14 0430 06/09/14 1745 06/10/14 0400  NA 141 143 141 141  < > 144 145 145 145 147* 148*  K 4.4 3.1* 3.7 3.7  < > 3.6 2.9* 3.5 2.8* 3.7 3.4*  CL 103 115* 114* 115*  < > 115* 112 114* 116* 118* 118*  CO2  23 26 20 22   < > 20 19 20 22 20 22   GLUCOSE 209* 132* 161* 106*  < > 138* 144* 168* 264* 260* 241*  BUN 37* 32* 32* 27*  < > 25* 26* 30* 38* 38* 37*  CREATININE 1.85* 1.43* 1.48* 1.48*  < > 1.49* 1.56* 1.54* 1.33 1.24 1.18  CALCIUM 8.5 7.4* 7.8* 8.1*  < > 8.7 8.9 8.7 8.2* 8.3* 8.2*  MG 1.1* 1.5 1.4* 1.7  --  1.6  --   --   --   --  1.7  PHOS 2.4  --  1.6* 2.4  --  2.7  --   --   --   --  1.7*  < > = values in this interval not displayed.  Liver Function Tests:  Recent Labs Lab 06/04/14 1149 06/04/14 1400 06/06/14 0420  AST 32 33 26  ALT 12 12 14   ALKPHOS 44 41 38*  BILITOT 1.1 0.8 0.6  PROT 4.5* 4.5* 3.9*  ALBUMIN 2.7* 2.6* 2.1*    Recent Labs Lab 06/04/14 2342  LIPASE 15  AMYLASE 143*    Recent Labs Lab 06/04/14 1400  AMMONIA 12    CBC:  Recent Labs Lab 06/04/14 1149  06/04/14 1400  06/06/14 0420  06/07/14 0029 06/07/14 0450 06/08/14 0500 06/09/14 0430 06/10/14 0400  WBC 4.3  --  3.4*  < > 7.6  --   --  11.0* 10.9* 12.4* 17.4*  NEUTROABS 3.3  --  2.5  --   --   --   --   --   --   --   --  HGB 13.0  < > 11.9*  < > 8.5*  < > 8.1* 8.3* 8.4* 8.3* 8.1*  HCT 39.2  < > 36.2*  < > 25.5*  < > 24.3* 24.4* 24.2* 23.7* 23.2*  MCV 97.0  --  96.3  < > 94.8  --   --  92.1 90.0 90.1 89.9  PLT 139*  --  130*  < > 99*  --   --  112* 130* 137* 135*  < > = values in this interval not displayed.  Cardiac Enzymes:  Recent Labs Lab 06/05/14 1100 06/06/14 0008 06/06/14 1140 06/07/14 0008 06/07/14 0450  TROPONINI 0.56* 1.43* 1.51* 1.14* 1.00*    Lipid Panel:  Recent Labs Lab 06/10/14 0400  CHOL 63  TRIG 203*  HDL 6*  CHOLHDL 10.5  VLDL 41*  LDLCALC 16    CBG:  Recent Labs Lab 06/09/14 1929 06/09/14 2351 06/10/14 0350 06/10/14 0722 06/10/14 1123  GLUCAP 231* 187* 206* 201* 159*    Microbiology: Results for orders placed or performed during the hospital encounter of 06/04/14  Blood culture (routine x 2)     Status: None (Preliminary result)    Collection Time: 06/04/14 11:50 AM  Result Value Ref Range Status   Specimen Description BLOOD HAND LEFT  Final   Special Requests BOTTLES DRAWN AEROBIC AND ANAEROBIC 2CC  Final   Culture   Final           BLOOD CULTURE RECEIVED NO GROWTH TO DATE CULTURE WILL BE HELD FOR 5 DAYS BEFORE ISSUING A FINAL NEGATIVE REPORT Performed at [facility]    Report Status PENDING  Incomplete  Blood culture (routine x 2)     Status: None (Preliminary result)   Collection Time: 06/04/14 11:54 AM  Result Value Ref Range Status   Specimen Description BLOOD LEFT FOREARM  Final   Special Requests BOTTLES DRAWN AEROBIC ONLY 2CC  Final   Culture   Final           BLOOD CULTURE RECEIVED NO GROWTH TO DATE CULTURE WILL BE HELD FOR 5 DAYS BEFORE ISSUING A FINAL NEGATIVE REPORT Performed at [facility]    Report Status PENDING  Incomplete  Culture, blood (routine x 2)     Status: None (Preliminary result)   Collection Time: 06/04/14  1:50 PM  Result Value Ref Range Status   Specimen Description BLOOD LEFT ANTECUBITAL  Final   Special Requests BOTTLES DRAWN AEROBIC AND ANAEROBIC  Final   Culture   Final           BLOOD CULTURE RECEIVED NO GROWTH TO DATE CULTURE WILL BE HELD FOR 5 DAYS BEFORE ISSUING A FINAL NEGATIVE REPORT Note: Culture results may be compromised due to an excessive volume of blood received in culture bottles. Performed at [facility]    Report Status PENDING  Incomplete  Culture, blood (routine x 2)     Status: None (Preliminary result)   Collection Time: 06/04/14  2:00 PM  Result Value Ref Range Status   Specimen Description BLOOD LEFT HAND  Final   Special Requests BOTTLES DRAWN AEROBIC AND ANAEROBIC  Final   Culture   Final           BLOOD CULTURE RECEIVED NO GROWTH TO DATE CULTURE WILL BE HELD FOR 5 DAYS BEFORE ISSUING A FINAL NEGATIVE REPORT Performed at [facility]    Report Status PENDING  Incomplete  MRSA PCR Screening     Status:  None   Collection Time: 06/04/14  4:20 PM  Result Value Ref Range Status   MRSA by PCR NEGATIVE NEGATIVE Final    Comment:        The GeneXpert MRSA Assay (FDA approved for NASAL specimens only), is one component of a comprehensive MRSA colonization surveillance program. It is not intended to diagnose MRSA infection nor to guide or monitor treatment for MRSA infections.   Urine culture     Status: None   Collection Time: 06/04/14  4:46 PM  Result Value Ref Range Status   Specimen Description URINE, CATHETERIZED  Final   Special Requests Normal  Final   Colony Count NO GROWTH Performed at [facility]   Final   Culture NO GROWTH Performed at [facility]   Final   Report Status 06/05/2014 FINAL  Final  Culture, respiratory (NON-Expectorated)     Status: None   Collection Time: 06/04/14  4:46 PM  Result Value Ref Range Status   Specimen Description TRACHEAL ASPIRATE  Final   Special Requests NONE  Final   Gram Stain   Final    FEW WBC PRESENT, PREDOMINANTLY MONONUCLEAR RARE SQUAMOUS EPITHELIAL CELLS PRESENT FEW GRAM POSITIVE COCCI IN PAIRS IN CLUSTERS MODERATE YEAST Performed at [facility]    Culture   Final    Non-Pathogenic Oropharyngeal-type Flora Isolated. Performed at [facility]    Report Status 06/07/2014 FINAL  Final  CSF culture     Status: None (Preliminary result)   Collection Time: 06/07/14  3:00 PM  Result Value Ref Range Status   Specimen Description CSF  Final   Special Requests Normal  Final   Gram Stain   Final    WBC PRESENT, PREDOMINANTLY MONONUCLEAR NO ORGANISMS SEEN CYTOSPIN Performed at [facility] Performed at [facility]    Culture   Final    NO GROWTH 2 DAYS Performed at [facility]    Report Status PENDING  Incomplete  Gram stain - STAT with CSF culture     Status: None   Collection Time: 06/07/14  3:00 PM  Result Value Ref Range Status   Specimen Description CSF  Final    Special Requests Normal  Final   Gram Stain   Final    CYTOSPIN PREP WBC PRESENT, PREDOMINANTLY MONONUCLEAR NO ORGANISMS SEEN    Report Status 06/07/2014 FINAL  Final  MRSA PCR Screening     Status: None   Collection Time: 06/07/14  9:51 PM  Result Value Ref Range Status   MRSA by PCR NEGATIVE NEGATIVE Final    Comment:        The GeneXpert MRSA Assay (FDA approved for NASAL specimens only), is one component of a comprehensive MRSA colonization surveillance program. It is not intended to diagnose MRSA infection nor to guide or monitor treatment for MRSA infections.     Coagulation Studies: No results for input(s): LABPROT, INR in the last 72 hours.  Imaging: Mr Brain Wo Contrast  06/08/2014   CLINICAL DATA:  Encephalopathy  EXAM: MRI HEAD WITHOUT CONTRAST  TECHNIQUE: Multiplanar, multiecho pulse sequences of the brain and surrounding structures were obtained without intravenous contrast.  COMPARISON:  CT head 06/04/2014  FINDINGS: Image quality degraded by mild motion.  Mild atrophy.  Negative for hydrocephalus.  5 mm area of restricted diffusion left occipital lobe consistent with acute infarct. No other areas of acute infarct identified  Negative for hemorrhage or fluid collection  Negative for mass lesion.  No significant chronic ischemic changes are present.  Sagittal T1 images inadvertently not obtained.  IMPRESSION: Small 5 mm area of acute infarct in the left occipital lobe. Otherwise negative.   Electronically Signed   By: Marlan Palau M.D.   On: 06/08/2014 16:44   Dg Chest Port 1 View  06/10/2014   CLINICAL DATA:  Acute respiratory failure and hypoxia.  EXAM: PORTABLE CHEST - 1 VIEW  COMPARISON:  06/09/2014  FINDINGS: The endotracheal tube tip is above the carina. There is a left IJ catheter with tip in the SVC. Feeding tube tip is in the stomach. Normal heart size. There is mild diffuse edema, increased from previous exam. Patchy airspace opacities in the left lung are  similar to previous exam.  IMPRESSION: 1. New patchy airspace opacities in the left lung. 2. Increased interstitial edema.   Electronically Signed   By: Signa Kell M.D.   On: 06/10/2014 07:58   Dg Chest Port 1 View  06/09/2014   CLINICAL DATA:  Acute respiratory failure  EXAM: PORTABLE CHEST - 1 VIEW  COMPARISON:  Chest x-ray from yesterday  FINDINGS: Endotracheal tube remains between the clavicular heads and carina. The left IJ catheter tip is in stable position at the SVC. The feeding tube continues into the stomach at least.  Bibasilar and left upper lung opacities persist. Diffuse interstitial opacities have gradually cleared, as has the left mid lung. There may be a small left pleural effusion. No pneumothorax.  IMPRESSION: 1. Tubes and central line remain in good position. 2. Continued improvement in aeration. Residual opacities could reflect pneumonia or atelectasis.   Electronically Signed   By: Tiburcio Pea M.D.   On: 06/09/2014 05:16   Dg Abd Portable 1v  06/09/2014   CLINICAL DATA:  Encounter for nasogastric tube placement.  EXAM: PORTABLE ABDOMEN - 1 VIEW  COMPARISON:  06/04/2014  FINDINGS: Feeding tube tip is in the proximal stomach.  The bowel gas pattern is nonobstructive. Cholecystectomy clips are noted. A pelvic themistor is partly visible.  IMPRESSION: Feeding tube tip in the proximal stomach.   Electronically Signed   By: Tiburcio Pea M.D.   On: 06/09/2014 05:17    Medications:  I have reviewed the patient's current medications. Scheduled: . antiseptic oral rinse  7 mL Mouth Rinse QID  . arformoterol  15 mcg Nebulization BID  . budesonide (PULMICORT) nebulizer solution  0.5 mg Nebulization BID  . carvedilol  12.5 mg Oral BID WC  . cefTRIAXone (ROCEPHIN)  IV  1 g Intravenous Q24H  . chlorhexidine  15 mL Mouth Rinse BID  . clopidogrel  75 mg Oral Daily  . docusate  100 mg Per Tube Daily  . enalapril  2.5 mg Oral BID  . insulin aspart  2-6 Units Subcutaneous 6 times per  day  . magnesium sulfate 1 - 4 g bolus IVPB  2 g Intravenous Once  . pantoprazole (PROTONIX) IV  40 mg Intravenous Q12H  . sodium phosphate  Dextrose 5% IVPB  20 mmol Intravenous Once    Assessment/Plan: Patient improved.  Patient on Plavix.  Recommendations: 1.  No further neurologic intervention is recommended at this time.  If further questions arise, please call or page at that time.  Thank you for allowing neurology to participate in the care of this patient.  Dopplers to be performed after extubation.  Continue Plavix.  Thana Farr, MD [facility] [phone] 06/10/2014  12:34 PM    LOS: 6 days   Thana Farr,  MD [facility] [phone] 06/10/2014  12:24 PM

## 2014-06-10 NOTE — Progress Notes (Addendum)
PULMONARY / CRITICAL CARE MEDICINE   Name: Marvin ShengDonald G Amend MRN: [id number] DOB: 10/22/1932    ADMISSION DATE:  06/04/2014 CONSULTATION DATE:  06/04/2014  REFERRING MD :  EDP  CHIEF COMPLAINT:  VDRF and upper GI bleed  INITIAL PRESENTATION: 81 year old male hx of arthritis, GERD, HLD, HTN, CAD s/p 3 stents, emphysema, PAD , DMII on daily ASA and plavix since 02/22/14 since DES  iliac stent placement 02/2014, but no NSAID use who presents to the hospital with hematemesis and AMS.  In the ED the patient was noted to not be protecting his airway and was intubated for airway protection.  OGT was placed with 1500 ml of coffee ground black material was noted.  PCCM was called to admit to the ICU.  No recent etoh history. Normal cscope and endoscope 2009.  STUDIES:  1/18  NGT lavaged with only coffee grounds, no active bleeding. 1/18  CT - mild atrophy.  1/18  KUB - normal. 1/19  EGD. LA class D esophagitis. this is the source of the bleeding. 1/20 Echo- EF 25%. Diffuse hypokinesis. No classic Takotsubo's, but function worse towards apex. No thrombi.  1/22 MRI Brain- small 5mm acute infarct in left occipital lobe- likely not cause for mental status changes 1/22 EEG- slow with triphasic waves, consistent with patient's condition. No seizures.   SIGNIFICANT EVENTS: 1/18  UGI bleed. hgb dropped. 1/19  EGD, weaned off pressor.  1/20  Failed extubation. Off pressors 1/22  Agitation overnight, on precedex / fentanyl, low grade fevers  SUBJECTIVE:  Alert, awake, follows commands. Tmax 101.6. WBC continues to trend up.  VITAL SIGNS: Temp:  [99.3 F (37.4 C)-101.5 F (38.6 C)] 100 F (37.8 C) (01/24 0700) Pulse Rate:  [72-109] 78 (01/24 0700) Resp:  [21-31] 23 (01/24 0700) BP: (118-147)/(60-85) 125/60 mmHg (01/24 0700) SpO2:  [100 %] 100 % (01/24 0700) FiO2 (%):  [40 %] 40 % (01/24 0600) Weight:  [77.6 kg (171 lb 1.2 oz)] 77.6 kg (171 lb 1.2 oz) (01/24 0414)   HEMODYNAMICS: CVP:  [8 mmHg-9  mmHg] 9 mmHg   VENTILATOR SETTINGS: Vent Mode:  [-] PRVC FiO2 (%):  [40 %] 40 % Set Rate:  [16 bmp] 16 bmp Vt Set:  [600 mL] 600 mL PEEP:  [5 cmH20] 5 cmH20 Pressure Support:  [5 cmH20-14 cmH20] 14 cmH20 Plateau Pressure:  [18 cmH20-24 cmH20] 22 cmH20   INTAKE / OUTPUT:  Intake/Output Summary (Last 24 hours) at 06/10/14 0737 Last data filed at 06/10/14 0630  Gross per 24 hour  Intake 2410.23 ml  Output   1545 ml  Net 865.23 ml    PHYSICAL EXAMINATION: General:  Intubated, sedated. NAD. Neuro: opens eyes to voice, tracks, follows simple commands HEENT:  [place]/AT, EOMI Cardiovascular: RRR, no m/g/r Lungs:  CTA bilaterally  Abdomen: BS+, soft, non-tender, non-distended  Musculoskeletal: no acute deformities Skin:  Warm, dry, no edema   LABS:  CBC  Recent Labs Lab 06/08/14 0500 06/09/14 0430 06/10/14 0400  WBC 10.9* 12.4* 17.4*  HGB 8.4* 8.3* 8.1*  HCT 24.2* 23.7* 23.2*  PLT 130* 137* 135*   Coag's  Recent Labs Lab 06/04/14 1149 06/04/14 1400  APTT  --  30  INR 1.28 1.29   BMET  Recent Labs Lab 06/09/14 0430 06/09/14 1745 06/10/14 0400  NA 145 147* 148*  K 2.8* 3.7 3.4*  CL 116* 118* 118*  CO2 22 20 22   BUN 38* 38* 37*  CREATININE 1.33 1.24 1.18  GLUCOSE 264* 260*  241*   Electrolytes  Recent Labs Lab 06/05/14 1700  06/07/14 0450  06/09/14 0430 06/09/14 1745 06/10/14 0400  CALCIUM 8.1*  < > 8.7  < > 8.2* 8.3* 8.2*  MG 1.7  --  1.6  --   --   --  1.7  PHOS 2.4  --  2.7  --   --   --  1.7*  < > = values in this interval not displayed.   Sepsis Markers  Recent Labs Lab 06/04/14 1150 06/04/14 1400 06/04/14 2342  06/08/14 0800 06/09/14 0430 06/10/14 0400  LATICACIDVEN 6.9* 6.4* 2.1  --   --   --   --   PROCALCITON  --  9.08  --   < > 2.70 2.08 1.24  < > = values in this interval not displayed.   ABG  Recent Labs Lab 06/04/14 1247 06/04/14 1451 06/05/14 0640  PHART 7.280* 7.330* 7.381  PCO2ART 46.4* 39.1 33.4*  PO2ART 323.0*  60.0* 127.0*   Liver Enzymes   Recent Labs Lab 06/04/14 1149 06/04/14 1400 06/06/14 0420  AST 32 33 26  ALT ALKPHOS 44 41 38*  BILITOT 1.1 0.8 0.6  ALBUMIN 2.7* 2.6* 2.1*   Cardiac Enzymes  Recent Labs Lab 06/06/14 1140 06/07/14 0008 06/07/14 0450  TROPONINI 1.51* 1.14* 1.00*   Glucose  Recent Labs Lab 06/09/14 1121 06/09/14 1515 06/09/14 1929 06/09/14 2351 06/10/14 0350 06/10/14 0722  GLUCAP 142* 213* 231* 187* 206* 201*    Imaging Dg Chest Port 1 View  06/09/2014   CLINICAL DATA:  Acute respiratory failure  EXAM: PORTABLE CHEST - 1 VIEW  COMPARISON:  Chest x-ray from yesterday  FINDINGS: Endotracheal tube remains between the clavicular heads and carina. The left IJ catheter tip is in stable position at the SVC. The feeding tube continues into the stomach at least.  Bibasilar and left upper lung opacities persist. Diffuse interstitial opacities have gradually cleared, as has the left mid lung. There may be a small left pleural effusion. No pneumothorax.  IMPRESSION: 1. Tubes and central line remain in good position. 2. Continued improvement in aeration. Residual opacities could reflect pneumonia or atelectasis.   Electronically Signed   By: Tiburcio Pea M.D.   On: 06/09/2014 05:16   Dg Abd Portable 1v  06/09/2014   CLINICAL DATA:  Encounter for nasogastric tube placement.  EXAM: PORTABLE ABDOMEN - 1 VIEW  COMPARISON:  06/04/2014  FINDINGS: Feeding tube tip is in the proximal stomach.  The bowel gas pattern is nonobstructive. Cholecystectomy clips are noted. A pelvic themistor is partly visible.  IMPRESSION: Feeding tube tip in the proximal stomach.   Electronically Signed   By: Tiburcio Pea M.D.   On: 06/09/2014 05:17     ASSESSMENT / PLAN:  PULMONARY OETT 1/18>>> A:  VDRF - due to AMS, hematemesis Aspiration pneumonia Mixed acidosis - mainly metabolic. Resolved.  P:   Daily SBT / WUA    CARDIOVASCULAR L IJ TLC 1/18 >> A:  Sinus  Tachycardia -  with occasional wide complex tachy - likely 2/2 to blood loss, sepsis, shock and NSTEMI with peak Tp 1.5 Septic shock - improved, off pressors CAD Troponin elevation - likely 2/2 to demand/shock Severe systolic CHF EF 25% on ECHO 06/06/14. ?Takotsubo appearance P:  Continue plavix  Continue coreg 6.25 mg BID, vasotec 2.5 mg BID Consider repeat ECHO when patient improves from current episode. May need to have ICD if EF remains this low.  RENAL A:  Acute on Chronic Renal Failure - baseline sr cr ~ 1.4,  Acute rise with acute blood loss vs septic shock.  Hypomagnesemia- improving  Hypokalemia  Hypophosphatemia  Hypernatremia Mixed acidosis - mainly metabolic, likely from lactic acidosis/sepsis. Resolved.   P:   Trend BMP Replace electrolytes as indicated  K-Dur 20 mEq Q4H for 2 doses Na phos 20 mmol x 1 Consider free water for hyperNa   GASTROINTESTINAL A:   LA class D esophagitis - identified source of the bleeding per EGD report.  P:   Protonix BID, needs daily indefinitely  Continue plavix, monitor closely for new bleeding  Continue TFs  HEMATOLOGIC A:   Acute on Chronic Anemia - baseline hgb ~ 12-13, likely from esophagitis seen on EGD. No more drop, stable now. Chronic Thrombocytopenia    P:  CBC daily  Transfuse goal hgb <8 since has Cardiac disease. DVT:  SCD's   INFECTIOUS A:  Fever - Tmax 101.5 in last 24 hrs. PCT 27.30. PCT now trending down.  Aspiration pneumonia - CXR with improving infiltrates P:   BCx2 1/18 >> negative UC 1/18 >> negative Sputum 1/18 >> negative  CSF 1/21 >>  Cefepime 1/18 >>1/20 Flagyl 1/18 >> 1/23 Ceftriaxone 1/20 >> 1/24 (total 7 days abx)  PCT down to 1 from 27 WBCs trending up from 11>17, fevers continued  ENDOCRINE A:   DM.   AI ruled out P:   SSI    NEUROLOGIC A:   Acute encephalopathy - in setting of shock, blood loss. Continues to improve.  Agitation - resolved.  Likely opiate dependance since  he is on large dose at home. P:   RASS goal: 0 Precedex + PRN Fentanyl  Ativan PRN since was on benzo at home. Would restart home xanax when takes PO.  Mobilize / PT as able    FAMILY  - Updates: no family available at bedside 1/23.      Rich Number, MD Internal Medicine Resident, PGY-1   Attending:  I have seen and examined the patient with nurse practitioner/resident and agree with the note above.   Failed SBT, loose secretions, increased WOB on PSV Awake, following commands WBC trending up, fever Cr improved  Lungs> prolonged expiration  COPD? Improving encephalopathy Unclear source of fever/wbc Is failure to wean related to cardiomyopathy?  Pull line, place PIV Culture Add bronchodilators and pulmicort Increase b-blocker per cardiology  My cc time 35 minutes  Heber [place], MD [facility] Pager: [phone] Cell: [phone] If no response, call [phone]

## 2014-06-10 NOTE — Progress Notes (Signed)
RT note- weaning performed and ETT advanced 3cm per order. Restlessness, placed back to full support, re attempt wean as tolerated.

## 2014-06-10 NOTE — Progress Notes (Signed)
Peripherally Inserted Central Catheter/Midline Placement  The IV Nurse has discussed with the patient and/or persons authorized to consent for the patient, the purpose of this procedure and the potential benefits and risks involved with this procedure.  The benefits include less needle sticks, lab draws from the catheter and patient may be discharged home with the catheter.  Risks include, but not limited to, infection, bleeding, blood clot (thrombus formation), and puncture of an artery; nerve damage and irregular heat beat.  Alternatives to this procedure were also discussed. Consent obtained via telephone from [facility] Center[facility]athy Winsett, daughter.  PICC/Midline Placement Documentation  PICC / Midline Double Lumen 06/10/14 PICC Left Basilic 42 cm 0 cm (Active)  Indication for Insertion or Continuance of Line Poor Vasculature-patient has had multiple peripheral attempts or PIVs lasting less than 24 hours;Limited venous access - need for IV therapy >5 days (PICC only);Prolonged intravenous therapies 06/10/2014  1:11 PM  Exposed Catheter (cm) 0 cm 06/10/2014  1:11 PM  Site Assessment Clean;Dry;Intact 06/10/2014  1:11 PM  Lumen #1 Status Flushed;Saline locked;Blood return noted 06/10/2014  1:11 PM  Lumen #2 Status Flushed;Saline locked;Blood return noted 06/10/2014  1:11 PM  Dressing Type Transparent 06/10/2014  1:11 PM  Dressing Status Clean;Dry;Intact;Antimicrobial disc in place 06/10/2014  1:11 PM  Line Care Connections checked and tightened 06/10/2014  1:11 PM  Line Adjustment (NICU/IV Team Only) No 06/10/2014  1:11 PM  Dressing Intervention New dressing 06/10/2014  1:11 PM  Dressing Change Due 06/17/14 06/10/2014  1:11 PM       Marvin Leon, Marvin Leon Wright 06/10/2014, 1:12 PM

## 2014-06-10 NOTE — Progress Notes (Addendum)
SUBJECTIVE:  Awake.  Intubated.  He denies chest pain.     PHYSICAL EXAM Filed Vitals:   06/10/14 0414 06/10/14 0500 06/10/14 0600 06/10/14 0700  BP:  134/67 147/85 125/60  Pulse:  89 109 78  Temp:  100.1 F (37.8 C) 99.6 F (37.6 C) 100 F (37.8 C)  TempSrc:      Resp:  26 30 23   Weight: 171 lb 1.2 oz (77.6 kg)     SpO2:  100% 100% 100%   General:  No distress, on sedation Lungs:  Decreased breath sounds, expiratory wheezing. Heart:  RRR Abdomen:  Decreased bowel sounds Extremities:  No edema.    LABS:  Results for orders placed or performed during the hospital encounter of 06/04/14 (from the past 24 hour(s))  Glucose, capillary     Status: Abnormal   Collection Time: 06/09/14 11:21 AM  Result Value Ref Range   Glucose-Capillary 142 (H) 70 - 99 mg/dL  Glucose, capillary     Status: Abnormal   Collection Time: 06/09/14  3:15 PM  Result Value Ref Range   Glucose-Capillary 213 (H) 70 - 99 mg/dL  Basic metabolic panel     Status: Abnormal   Collection Time: 06/09/14  5:45 PM  Result Value Ref Range   Sodium 147 (H) 135 - 145 mmol/L   Potassium 3.7 3.5 - 5.1 mmol/L   Chloride 118 (H) 96 - 112 mmol/L   CO2 20 19 - 32 mmol/L   Glucose, Bld 260 (H) 70 - 99 mg/dL   BUN 38 (H) 6 - 23 mg/dL   Creatinine, Ser 1.321.24 0.50 - 1.35 mg/dL   Calcium 8.3 (L) 8.4 - 10.5 mg/dL   GFR calc non Af Amer 54 (L) >90 mL/min   GFR calc Af Amer 62 (L) >90 mL/min   Anion gap 9 5 - 15  Glucose, capillary     Status: Abnormal   Collection Time: 06/09/14  7:29 PM  Result Value Ref Range   Glucose-Capillary 231 (H) 70 - 99 mg/dL   Comment 1 Notify RN   Glucose, capillary     Status: Abnormal   Collection Time: 06/09/14 11:51 PM  Result Value Ref Range   Glucose-Capillary 187 (H) 70 - 99 mg/dL   Comment 1 Notify RN   Glucose, capillary     Status: Abnormal   Collection Time: 06/10/14  3:50 AM  Result Value Ref Range   Glucose-Capillary 206 (H) 70 - 99 mg/dL   Comment 1 Notify RN     BMET in AM     Status: Abnormal   Collection Time: 06/10/14  4:00 AM  Result Value Ref Range   Sodium 148 (H) 135 - 145 mmol/L   Potassium 3.4 (L) 3.5 - 5.1 mmol/L   Chloride 118 (H) 96 - 112 mmol/L   CO2 22 19 - 32 mmol/L   Glucose, Bld 241 (H) 70 - 99 mg/dL   BUN 37 (H) 6 - 23 mg/dL   Creatinine, Ser 4.401.18 0.50 - 1.35 mg/dL   Calcium 8.2 (L) 8.4 - 10.5 mg/dL   GFR calc non Af Amer 57 (L) >90 mL/min   GFR calc Af Amer 66 (L) >90 mL/min   Anion gap 8 5 - 15  Magnesium     Status: None   Collection Time: 06/10/14  4:00 AM  Result Value Ref Range   Magnesium 1.7 1.5 - 2.5 mg/dL  Phosphorus     Status: Abnormal   Collection Time: 06/10/14  4:00 AM  Result Value Ref Range   Phosphorus 1.7 (L) 2.3 - 4.6 mg/dL  CBC     Status: Abnormal   Collection Time: 06/10/14  4:00 AM  Result Value Ref Range   WBC 17.4 (H) 4.0 - 10.5 K/uL   RBC 2.58 (L) 4.22 - 5.81 MIL/uL   Hemoglobin 8.1 (L) 13.0 - 17.0 g/dL   HCT 16.1 (L) 09.6 - 04.5 %   MCV 89.9 78.0 - 100.0 fL   MCH 31.4 26.0 - 34.0 pg   MCHC 34.9 30.0 - 36.0 g/dL   RDW 40.9 81.1 - 91.4 %   Platelets 135 (L) 150 - 400 K/uL  Lipid panel     Status: Abnormal   Collection Time: 06/10/14  4:00 AM  Result Value Ref Range   Cholesterol 63 0 - 200 mg/dL   Triglycerides 782 (H) <150 mg/dL   HDL 6 (L) >95 mg/dL   Total CHOL/HDL Ratio 10.5 RATIO   VLDL 41 (H) 0 - 40 mg/dL   LDL Cholesterol 16 0 - 99 mg/dL  Procalcitonin     Status: None   Collection Time: 06/10/14  4:00 AM  Result Value Ref Range   Procalcitonin 1.24 ng/mL  Glucose, capillary     Status: Abnormal   Collection Time: 06/10/14  7:22 AM  Result Value Ref Range   Glucose-Capillary 201 (H) 70 - 99 mg/dL    Intake/Output Summary (Last 24 hours) at 06/10/14 1040 Last data filed at 06/10/14 0700  Gross per 24 hour  Intake 2082.35 ml  Output   1420 ml  Net 662.35 ml     ASSESSMENT AND PLAN:  CARDIOMYOPATHY:  Suspect not related to new acute coronary event.  CVP is 7 - 9  early this AM.  Increase Coreg today  ELEVATED TROPONIN:  Probably demand ischemia.  Defer further ischemia work up until acute problems have improved.    Fayrene Fearing [facility] 06/10/2014 10:40 AM

## 2014-06-11 ENCOUNTER — Inpatient Hospital Stay (HOSPITAL_COMMUNITY): Payer: Medicare Other

## 2014-06-11 DIAGNOSIS — M7989 Other specified soft tissue disorders: Secondary | ICD-10-CM

## 2014-06-11 LAB — BASIC METABOLIC PANEL
Anion gap: 8 (ref 5–15)
BUN: 38 mg/dL — ABNORMAL HIGH (ref 6–23)
CHLORIDE: 118 mmol/L — AB (ref 96–112)
CO2: 23 mmol/L (ref 19–32)
Calcium: 8.1 mg/dL — ABNORMAL LOW (ref 8.4–10.5)
Creatinine, Ser: 1.06 mg/dL (ref 0.50–1.35)
GFR calc Af Amer: 75 mL/min — ABNORMAL LOW (ref >90)
GFR calc non Af Amer: 65 mL/min — ABNORMAL LOW (ref >90)
Glucose, Bld: 214 mg/dL — ABNORMAL HIGH (ref 70–99)
Potassium: 3.5 mmol/L (ref 3.5–5.1)
SODIUM: 149 mmol/L — AB (ref 135–145)

## 2014-06-11 LAB — CBC
HEMATOCRIT: 22.8 % — AB (ref 39.0–52.0)
HEMATOCRIT: 29.5 % — AB (ref 39.0–52.0)
HEMOGLOBIN: 10.1 g/dL — AB (ref 13.0–17.0)
Hemoglobin: 7.7 g/dL — ABNORMAL LOW (ref 13.0–17.0)
MCH: 30.9 pg (ref 26.0–34.0)
MCH: 31.5 pg (ref 26.0–34.0)
MCHC: 33.8 g/dL (ref 30.0–36.0)
MCHC: 34.2 g/dL (ref 30.0–36.0)
MCV: 91.6 fL (ref 78.0–100.0)
MCV: 91.9 fL (ref 78.0–100.0)
PLATELETS: 142 10*3/uL — AB (ref 150–400)
Platelets: 134 10*3/uL — ABNORMAL LOW (ref 150–400)
RBC: 2.49 MIL/uL — ABNORMAL LOW (ref 4.22–5.81)
RBC: 3.21 MIL/uL — ABNORMAL LOW (ref 4.22–5.81)
RDW: 15.5 % (ref 11.5–15.5)
RDW: 15.8 % — AB (ref 11.5–15.5)
WBC: 16.4 10*3/uL — ABNORMAL HIGH (ref 4.0–10.5)
WBC: 20.5 10*3/uL — ABNORMAL HIGH (ref 4.0–10.5)

## 2014-06-11 LAB — GLUCOSE, CAPILLARY
GLUCOSE-CAPILLARY: 108 mg/dL — AB (ref 70–99)
GLUCOSE-CAPILLARY: 145 mg/dL — AB (ref 70–99)
Glucose-Capillary: 151 mg/dL — ABNORMAL HIGH (ref 70–99)
Glucose-Capillary: 159 mg/dL — ABNORMAL HIGH (ref 70–99)
Glucose-Capillary: 174 mg/dL — ABNORMAL HIGH (ref 70–99)
Glucose-Capillary: 179 mg/dL — ABNORMAL HIGH (ref 70–99)

## 2014-06-11 LAB — PREPARE RBC (CROSSMATCH)

## 2014-06-11 LAB — PROTIME-INR
INR: 1.23 (ref 0.00–1.49)
Prothrombin Time: 15.7 seconds — ABNORMAL HIGH (ref 11.6–15.2)

## 2014-06-11 LAB — MAGNESIUM: Magnesium: 1.9 mg/dL (ref 1.5–2.5)

## 2014-06-11 LAB — PHOSPHORUS: Phosphorus: 2.8 mg/dL (ref 2.3–4.6)

## 2014-06-11 LAB — APTT: aPTT: 30 seconds (ref 24–37)

## 2014-06-11 MED ORDER — ENALAPRIL MALEATE 5 MG PO TABS
5.0000 mg | ORAL_TABLET | Freq: Two times a day (BID) | ORAL | Status: DC
  Administered 2014-06-11 – 2014-06-12 (×2): 5 mg via ORAL
  Filled 2014-06-11 (×3): qty 1

## 2014-06-11 MED ORDER — FUROSEMIDE 10 MG/ML IJ SOLN
40.0000 mg | Freq: Once | INTRAMUSCULAR | Status: AC
  Administered 2014-06-11: 40 mg via INTRAVENOUS
  Filled 2014-06-11: qty 4

## 2014-06-11 MED ORDER — FREE WATER
200.0000 mL | Freq: Three times a day (TID) | Status: DC
  Administered 2014-06-11 – 2014-06-13 (×9): 200 mL

## 2014-06-11 MED ORDER — POTASSIUM CHLORIDE 20 MEQ/15ML (10%) PO SOLN
ORAL | Status: AC
  Filled 2014-06-11: qty 15

## 2014-06-11 MED ORDER — FENTANYL CITRATE 0.05 MG/ML IJ SOLN
12.5000 ug | INTRAMUSCULAR | Status: DC | PRN
  Administered 2014-06-11 – 2014-06-12 (×3): 25 ug via INTRAVENOUS
  Administered 2014-06-13 (×2): 12.5 ug via INTRAVENOUS
  Administered 2014-06-14 (×2): 25 ug via INTRAVENOUS
  Filled 2014-06-11 (×7): qty 2

## 2014-06-11 MED ORDER — CARVEDILOL 25 MG PO TABS
25.0000 mg | ORAL_TABLET | Freq: Two times a day (BID) | ORAL | Status: DC
  Administered 2014-06-11 – 2014-06-14 (×6): 25 mg via ORAL
  Filled 2014-06-11 (×16): qty 1

## 2014-06-11 MED ORDER — HEPARIN (PORCINE) IN NACL 100-0.45 UNIT/ML-% IJ SOLN
1150.0000 [IU]/h | INTRAMUSCULAR | Status: DC
  Administered 2014-06-11: 1300 [IU]/h via INTRAVENOUS
  Filled 2014-06-11 (×3): qty 250

## 2014-06-11 MED ORDER — POTASSIUM CHLORIDE 20 MEQ/15ML (10%) PO SOLN
10.0000 meq | Freq: Once | ORAL | Status: AC
Start: 2014-06-11 — End: 2014-06-11
  Administered 2014-06-11: 10 meq via ORAL
  Filled 2014-06-11: qty 7.5

## 2014-06-11 MED ORDER — SODIUM CHLORIDE 0.9 % IV SOLN: Freq: Once | INTRAVENOUS | Status: AC

## 2014-06-11 NOTE — Progress Notes (Signed)
UR Completed.  [phone]  

## 2014-06-11 NOTE — Progress Notes (Signed)
PROGRESS NOTE  Subjective:   Marvin Leon is a 81 yo with hx of CAD, carotid artery diesease admitted with GI bleed   Developed coffee ground emesis,  Developed sinus tach . Mild elevation of Troponin   Echo shows  Severe LV dysfunction with EF of 25%. Normal right sided function  Objective:    Vital Signs:   Temp:  [98.2 F (36.8 C)-99.8 F (37.7 C)] 98.3 F (36.8 C) (01/25 1043) Pulse Rate:  [67-107] 88 (01/25 1043) Resp:  [18-28] 22 (01/25 1043) BP: (110-142)/(49-80) 130/58 mmHg (01/25 1043) SpO2:  [96 %-100 %] 100 % (01/25 1043) FiO2 (%):  [40 %] 40 % (01/25 1043) Weight:  [172 lb 13.5 oz (78.4 kg)] 172 lb 13.5 oz (78.4 kg) (01/25 0500)      24-hour weight change: Weight change: 1 lb 12.2 oz (0.8 kg)  Weight trends: Filed Weights   06/09/14 0400 06/10/14 0414 06/11/14 0500  Weight: 170 lb 6.7 oz (77.3 kg) 171 lb 1.2 oz (77.6 kg) 172 lb 13.5 oz (78.4 kg)    Intake/Output:  01/24 0701 - 01/25 0700 In: 1949.7 [I.V.:769.7; NG/GT:1080; IV Piggyback:100] Out: 1550 [Urine:1550] Total I/O In: 20 [I.V.:20] Out: -    Physical Exam: BP 130/58 mmHg  Pulse 88  Temp(Src) 98.3 F (36.8 C) (Core (Comment))  Resp 22  Wt 172 lb 13.5 oz (78.4 kg)  SpO2 100%  Wt Readings from Last 3 Encounters:  06/11/14 172 lb 13.5 oz (78.4 kg)  03/20/14 162 lb 8 oz (73.71 kg)  02/23/14 165 lb 9.1 oz (75.1 kg)    General: Vital signs reviewed and noted. Chronically ill appearing   Head: Normocephalic, atraumatic.  Eyes: conjunctivae/corneas clear.  EOM's intact.   Throat: normal  Neck:  normal   Lungs:    clear ant.   Heart:  Rr   Abdomen:  Soft, non-tender, non-distended    Extremities: Trace edema   Neurologic: A&O X3, CN II - XII are grossly intact. , slow to respond   Psych: Normal     Labs: BMET:  Recent Labs  06/10/14 0400 06/11/14 0420  NA 148* 149*  K 3.4* 3.5  CL 118* 118*  CO2 22 23  GLUCOSE 241* 214*  BUN 37* 38*  CREATININE 1.18 1.06  CALCIUM 8.2*  8.1*  MG 1.7 1.9  PHOS 1.7* 2.8    Liver function tests: No results for input(s): AST, ALT, ALKPHOS, BILITOT, PROT, ALBUMIN in the last 72 hours. No results for input(s): LIPASE, AMYLASE in the last 72 hours.  CBC:  Recent Labs  06/10/14 0400 06/11/14 0420  WBC 17.4* 16.4*  HGB 8.1* 7.7*  HCT 23.2* 22.8*  MCV 89.9 91.6  PLT 135* 134*    Cardiac Enzymes: No results for input(s): CKTOTAL, CKMB, TROPONINI in the last 72 hours.  Coagulation Studies: No results for input(s): LABPROT, INR in the last 72 hours.  Other: Invalid input(s): POCBNP No results for input(s): DDIMER in the last 72 hours.  Recent Labs  06/10/14 0400  HGBA1C 6.4*    Recent Labs  06/10/14 0400  CHOL 63  HDL 6*  LDLCALC 16  TRIG 203*  CHOLHDL 10.5   No results for input(s): TSH, T4TOTAL, T3FREE, THYROIDAB in the last 72 hours.  Invalid input(s): FREET3 No results for input(s): VITAMINB12, FOLATE, FERRITIN, TIBC, IRON, RETICCTPCT in the last 72 hours.   Other results:  EKG :  Sinus tach, ant. MI.   Medications:    Infusions: .  sodium chloride 20 mL/hr at 06/10/14 1337  . dexmedetomidine 0.8 mcg/kg/hr (06/11/14 0030)  . feeding supplement (VITAL AF 1.2 CAL) 1,000 mL (06/10/14 1419)    Scheduled Medications: . sodium chloride   Intravenous Once  . arformoterol  15 mcg Nebulization BID  . budesonide (PULMICORT) nebulizer solution  0.5 mg Nebulization BID  . carvedilol  12.5 mg Oral BID WC  . clopidogrel  75 mg Oral Daily  . docusate  100 mg Per Tube Daily  . enalapril  2.5 mg Oral BID  . free water  200 mL Per Tube 3 times per day  . insulin aspart  2-6 Units Subcutaneous 6 times per day  . pantoprazole (PROTONIX) IV  40 mg Intravenous Q12H  . potassium chloride      . sodium chloride  10-40 mL Intracatheter Q12H    Assessment/ Plan:      1. Troponin elevation.  I suspect this was due to demand ischemia.   He will need to improve significantly before we start any  cardiology work up. Doubt this was an acute coronary event.  2. Acute systolic CHF:  Continue supportive care.  He may have had a "takotsubo " like event.  Will continue to titrate up his coreg and Vasotec.     Disposition:  Length of Stay: 7  Vesta MixerPhilip J. Nahser, Montez HagemanJr., MD, [facility] 06/11/2014, 1:19 PM Office [phone] Pager [phone]

## 2014-06-11 NOTE — Progress Notes (Signed)
*  Preliminary Results* Left upper extremity venous duplex completed. Left upper extremity is positive for deep vein thrombosis involving the left axillary and brachial veins. The axillary vein thrombosis appears to be slightly mobile.   06/11/2014 4:20 PM  Gertie FeyMichelle Raysean Graumann, RVT, RDCS, RDMS

## 2014-06-11 NOTE — Progress Notes (Signed)
PULMONARY / CRITICAL CARE MEDICINE   Name: Marvin Leon MRN: [id number] DOB: [birth year]    ADMISSION DATE:  06/04/2014 CONSULTATION DATE:  06/04/2014  REFERRING MD :  EDP  CHIEF COMPLAINT:  VDRF and upper GI bleed  INITIAL PRESENTATION: 81 year old male hx of arthritis, GERD, HLD, HTN, CAD s/p 3 stents, emphysema, PAD , DMII on daily ASA and plavix since 02/22/14 since DES  iliac stent placement 02/2014, but no NSAID use who presents to the hospital with hematemesis and AMS.  In the ED the patient was noted to not be protecting his airway and was intubated for airway protection.  OGT was placed with 1500 ml of coffee ground black material was noted.  PCCM was called to admit to the ICU.  No recent etoh history. Normal cscope and endoscope 2009.  STUDIES:  1/18  NGT lavaged with only coffee grounds, no active bleeding. 1/18  CT - mild atrophy.  1/18  KUB - normal. 1/19  EGD. LA class D esophagitis. this is the source of the bleeding. 1/20 Echo- EF 25%. Diffuse hypokinesis. No classic Takotsubo's, but function worse towards apex. No thrombi.  1/22 MRI Brain- small 5mm acute infarct in left occipital lobe- likely not cause for mental status changes 1/22 EEG- slow with triphasic waves, consistent with patient's condition. No seizures.   SIGNIFICANT EVENTS: 1/18  UGI bleed. hgb dropped. 1/19  EGD, weaned off pressor.  1/20  Failed extubation. Off pressors 1/22  Agitation overnight, on precedex / fentanyl, low grade fevers  SUBJECTIVE:  Afebrile over last 24 hours. WBC trending down. Following simple commands.   VITAL SIGNS: Temp:  [98.1 F (36.7 C)-99.8 F (37.7 C)] 98.5 F (36.9 C) (01/25 0600) Pulse Rate:  [67-118] 80 (01/25 0429) Resp:  [18-38] 19 (01/25 0600) BP: (110-141)/(49-82) 120/50 mmHg (01/25 0600) SpO2:  [98 %-100 %] 100 % (01/25 0429) FiO2 (%):  [40 %] 40 % (01/25 0600) Weight:  [78.4 kg (172 lb 13.5 oz)] 78.4 kg (172 lb 13.5 oz) (01/25 0500)   HEMODYNAMICS: CVP:   [10 mmHg] 10 mmHg   VENTILATOR SETTINGS: Vent Mode:  [-] PRVC FiO2 (%):  [40 %] 40 % Set Rate:  [16 bmp] 16 bmp Vt Set:  [600 mL] 600 mL PEEP:  [5 cmH20] 5 cmH20 Pressure Support:  [5 cmH20] 5 cmH20 Plateau Pressure:  [18 cmH20-23 cmH20] 20 cmH20   INTAKE / OUTPUT:  Intake/Output Summary (Last 24 hours) at 06/11/14 0710 Last data filed at 06/11/14 0600  Gross per 24 hour  Intake 1949.7 ml  Output   1550 ml  Net  399.7 ml    PHYSICAL EXAMINATION: General: Sedated on vent, NAD Neuro: opens eyes to voice, tracks, follows simple commands HEENT:  [place]/AT, EOMI Cardiovascular: RRR, no m/g/r Lungs: coarse breath sounds bilaterally  Abdomen: BS+, soft, non-distended, mild tenderness to palpation   Musculoskeletal: no acute deformities Skin:  Warm, dry, no edema   LABS:  CBC  Recent Labs Lab 06/09/14 0430 06/10/14 0400 06/11/14 0420  WBC 12.4* 17.4* 16.4*  HGB 8.3* 8.1* 7.7*  HCT 23.7* 23.2* 22.8*  PLT 137* 135* 134*   Coag's  Recent Labs Lab 06/04/14 1149 06/04/14 1400  APTT  --  30  INR 1.28 1.29   BMET  Recent Labs Lab 06/09/14 1745 06/10/14 0400 06/11/14 0420  NA 147* 148* 149*  K 3.7 3.4* 3.5  CL 118* 118* 118*  CO2 20 22 23   BUN 38* 37* 38*  CREATININE 1.24  1.18 1.06  GLUCOSE 260* 241* 214*   Electrolytes  Recent Labs Lab 06/07/14 0450  06/09/14 1745 06/10/14 0400 06/11/14 0420  CALCIUM 8.7  < > 8.3* 8.2* 8.1*  MG 1.6  --   --  1.7 1.9  PHOS 2.7  --   --  1.7* 2.8  < > = values in this interval not displayed.   Sepsis Markers  Recent Labs Lab 06/04/14 1150 06/04/14 1400 06/04/14 2342  06/08/14 0800 06/09/14 0430 06/10/14 0400  LATICACIDVEN 6.9* 6.4* 2.1  --   --   --   --   PROCALCITON  --  9.08  --   < > 2.70 2.08 1.24  < > = values in this interval not displayed.   ABG  Recent Labs Lab 06/04/14 1247 06/04/14 1451 06/05/14 0640  PHART 7.280* 7.330* 7.381  PCO2ART 46.4* 39.1 33.4*  PO2ART 323.0* 60.0* 127.0*   Liver  Enzymes   Recent Labs Lab 06/04/14 1149 06/04/14 1400 06/06/14 0420  AST 32 33 26  ALT ALKPHOS 44 41 38*  BILITOT 1.1 0.8 0.6  ALBUMIN 2.7* 2.6* 2.1*   Cardiac Enzymes  Recent Labs Lab 06/06/14 1140 06/07/14 0008 06/07/14 0450  TROPONINI 1.51* 1.14* 1.00*   Glucose  Recent Labs Lab 06/10/14 0722 06/10/14 1123 06/10/14 1537 06/10/14 1954 06/10/14 2342 06/11/14 0345  GLUCAP 201* 159* 202* 176* 179* 174*    Imaging Dg Chest Port 1 View  06/10/2014   CLINICAL DATA:  Acute respiratory failure and hypoxia.  EXAM: PORTABLE CHEST - 1 VIEW  COMPARISON:  06/09/2014  FINDINGS: The endotracheal tube tip is above the carina. There is a left IJ catheter with tip in the SVC. Feeding tube tip is in the stomach. Normal heart size. There is mild diffuse edema, increased from previous exam. Patchy airspace opacities in the left lung are similar to previous exam.  IMPRESSION: 1. New patchy airspace opacities in the left lung. 2. Increased interstitial edema.   Electronically Signed   By: Signa Kell M.D.   On: 06/10/2014 07:58     ASSESSMENT / PLAN:  PULMONARY OETT 1/18>>> A:  VDRF - due to AMS, hematemesis Aspiration pneumonia ?COPD- heavy smoking history P:   Daily SBT / WUA  > extubate 1/25 Continue Brovana, Pulmicort, Duonebs F/u CXR   CARDIOVASCULAR L IJ TLC 1/18 >>1/24 PICC 1/24>> A:  Sinus Tachycardia -  with occasional wide complex tachy - likely 2/2 to blood loss, sepsis, shock and NSTEMI with peak Tp 1.5 Septic shock - improved, off pressors CAD Troponin elevation - likely 2/2 to demand/shock > improved Severe systolic CHF EF 25% on ECHO 06/06/14. ?Takotsubo appearance P:  Continue plavix  Continue coreg 12.5 mg BID, vasotec 2.5 mg BID per cardiology Consider repeat ECHO when patient improves from current episode. May need to have ICD if EF remains this low. Continue Metoprolol 2.5 mg IV PRN HR >125  RENAL A:   Acute on Chronic Renal Failure -  baseline sr cr ~ 1.4,  Acute rise with acute blood loss vs septic shock. - Resolved Hypomagnesemia- improving  Hypokalemia  Hypophosphatemia - improving  Hypernatremia P:   Trend BMP Replace electrolytes as indicated  Free water added for hyperNa   GASTROINTESTINAL A:   LA class D esophagitis - identified source of the bleeding per EGD report.  P:   Protonix BID, needs daily indefinitely  Continue plavix, monitor closely for new bleeding  Continue TFs  HEMATOLOGIC A:  Acute on Chronic Anemia - baseline hgb ~ 12-13, likely from esophagitis seen on EGD. No more drop, stable now. Chronic Thrombocytopenia    P:  Hbg 7.7> transfused 1 unit F/u post-transfusion Hbg CBC daily  Transfuse goal hgb <8 since has Cardiac disease. DVT:  SCD's   INFECTIOUS A:  Fever - Improving. Unknown etiology- possibly central line, taken out 1/24. Afebrile o/ 24 hrs. WBC count trending down.  Aspiration pneumonia - CXR with infiltrates P:   BCx2 1/18 >> negative UC 1/18 >> negative Sputum 1/18 >> negative  CSF 1/21 >> negative L IJ tip culture 1/24>>  Cefepime 1/18 >>1/20 Flagyl 1/18 >> 1/23 Ceftriaxone 1/20 >> 1/24   PCT down to 1 from 27 Afebrile for 24 hours. WBC trending down.   ENDOCRINE A:   DM.   AI ruled out P:   SSI    NEUROLOGIC A:   Acute encephalopathy - in setting of shock, blood loss. Continues to improve.  Agitation - resolved.  Likely opiate dependance since he is on large dose at home. P:   RASS goal: 0 Precedex + PRN Fentanyl  Ativan PRN since was on benzo at home. Would restart home xanax when takes PO.  Mobilize / PT as able    FAMILY  - Updates: no family available at bedside 1/23.      Rich Number, MD Internal Medicine Resident, PGY-1  Attending:  I have seen and examined the patient with nurse practitioner/resident and agree with the note above.   Overall condition much improved.  Still weak, but doing great on SBT, will move towards  extubation today. Lungs clear on exam, HR improved.  Plan extubation today Cardiac care per cardiology Avoid sedating medications PT consult  My cc time 35 minutes  Heber Almond, MD [place] [facility] Pager: [phone] Cell: [phone] If no response, call [phone]

## 2014-06-11 NOTE — Progress Notes (Signed)
ANTICOAGULATION CONSULT NOTE - Initial Consult  Pharmacy Consult for Heparin Indication: DVT  Allergies  Allergen Reactions  . Darvocet [Propoxyphene N-Acetaminophen] Nausea And Vomiting  . Penicillins Nausea And Vomiting    Patient Measurements: Weight: 172 lb 13.5 oz (78.4 kg) Heparin Dosing Weight: 78 kg  Vital Signs: Temp: 99.5 F (37.5 C) (01/25 1500) Temp Source: Core (Comment) (01/25 1200) BP: 146/69 mmHg (01/25 1617) Pulse Rate: 106 (01/25 1617)  Labs:  Recent Labs  06/09/14 0430 06/09/14 1745 06/10/14 0400 06/11/14 0420  HGB 8.3*  --  8.1* 7.7*  HCT 23.7*  --  23.2* 22.8*  PLT 137*  --  135* 134*  CREATININE 1.33 1.24 1.18 1.06    Estimated Creatinine Clearance: 56.5 mL/min (by C-G formula based on Cr of 1.06).   Medical History: Past Medical History  Diagnosis Date  . Arthritis   . GERD (gastroesophageal reflux disease)   . Hyperlipidemia   . Hypertension   . Coronary artery disease   . Carotid artery disease   . Peripheral arterial disease     50% ostial right common iliac artery stenosis without claudication  . Emphysema   . Shortness of breath   . Diabetes mellitus without complication     type 2  . Anxiety     Medications:  Prescriptions prior to admission  Medication Sig Dispense Refill Last Dose  . ALPRAZolam (XANAX) 1 MG tablet Take 1 mg by mouth 2 (two) times daily.     06/03/2014 at Unknown time  . aspirin EC 81 MG tablet Take 81 mg by mouth every morning.     06/03/2014 at Unknown time  . cetirizine (ZYRTEC) 10 MG tablet Take 5 mg by mouth daily.   06/03/2014 at Unknown time  . cholecalciferol (VITAMIN D) 400 UNITS TABS tablet Take 400 Units by mouth 2 (two) times daily.    06/03/2014 at Unknown time  . clopidogrel (PLAVIX) 75 MG tablet Take 1 tablet (75 mg total) by mouth daily. 30 tablet 11 06/03/2014 at Unknown time  . dexlansoprazole (DEXILANT) 60 MG capsule Take 60 mg by mouth daily before breakfast.    06/03/2014 at Unknown time  .  docusate sodium (COLACE) 100 MG capsule Take 200 mg by mouth daily.    06/03/2014 at Unknown time  . ezetimibe-simvastatin (VYTORIN) 10-40 MG per tablet Take 1 tablet by mouth 3 (three) times a week. Mondays, Thursdays and Saturdays   Past Week at Unknown time  . Ferrous Sulfate (IRON) 28 MG TABS Take 28 mg by mouth once a week. Wednesdays   Past Week at Unknown time  . isosorbide mononitrate (IMDUR) 30 MG 24 hr tablet Take 15 mg by mouth at bedtime.     06/03/2014 at Unknown time  . metFORMIN (GLUCOPHAGE) 1000 MG tablet Take 1 tablet (1,000 mg total) by mouth 2 (two) times daily with a meal. HOLD for 2 days, restart on 02/25/2014   06/03/2014 at Unknown time  . metoCLOPramide (REGLAN) 10 MG tablet Take 5 mg by mouth daily before breakfast.   06/03/2014 at Unknown time  . metoprolol (TOPROL-XL) 50 MG 24 hr tablet Take 25 mg by mouth every morning.    06/03/2014 at 0800  . morphine (MSIR) 15 MG tablet Take 15 mg by mouth 3 (three) times daily as needed for severe pain.   06/04/2014 at 0600  . Multiple Vitamins-Minerals (MULTIVITAMINS THER. W/MINERALS) TABS Take 1 tablet by mouth daily.     06/03/2014 at Unknown time  . niacin (  NIASPAN) 750 MG CR tablet Take 750-1,500 mg by mouth at bedtime. Alternates every 2 days - 1500mg  x 2 days, 750mg  x 2 days, 1500mg  x 2 days.....   06/03/2014 at Unknown time  . PARoxetine (PAXIL) 20 MG tablet Take 20 mg by mouth every evening.     06/03/2014 at Unknown time  . pregabalin (LYRICA) 75 MG capsule Take 1 capsule (75 mg total) by mouth 2 (two) times daily. 180 capsule 3 06/03/2014 at Unknown time  . Probiotic Product (ULTRAFLORA IMMUNE HEALTH PO) Take 1 tablet by mouth daily before breakfast.   06/03/2014 at Unknown time  . tamsulosin (FLOMAX) 0.4 MG CAPS Take 0.4 mg by mouth daily after supper.    06/03/2014 at Unknown time   Scheduled:  . sodium chloride   Intravenous Once  . arformoterol  15 mcg Nebulization BID  . budesonide (PULMICORT) nebulizer solution  0.5 mg  Nebulization BID  . carvedilol  25 mg Oral BID WC  . clopidogrel  75 mg Oral Daily  . docusate  100 mg Per Tube Daily  . enalapril  5 mg Oral BID  . free water  200 mL Per Tube 3 times per day  . insulin aspart  2-6 Units Subcutaneous 6 times per day  . pantoprazole (PROTONIX) IV  40 mg Intravenous Q12H  . potassium chloride      . sodium chloride  10-40 mL Intracatheter Q12H   Infusions:  . sodium chloride 20 mL/hr at 06/11/14 1522  . dexmedetomidine 0.8 mcg/kg/hr (06/11/14 0030)  . feeding supplement (VITAL AF 1.2 CAL) 1,000 mL (06/11/14 1506)    Assessment: 81yo male with history of CAD was admitted for GI bleed and found to have DVT involving L axillary and brachial veins. Pharmacy is consulted to dose heparin for VTE treatment. Hgb 7.7, Plt 134, sCr 1.06. Noted that pt is currently on PPI for GIB and plavix.  Goal of Therapy:  Heparin level 0.3-0.7 units/ml Monitor platelets by anticoagulation protocol: Yes   Plan:  Hold bolus due to GIB Start heparin infusion at 1300 units/hr Check anti-Xa level in 8 hours and daily while on heparin Continue to monitor H&H and platelets  Arlean Hoppingorey M. Newman PiesBall, PharmD Clinical Pharmacist Pager [phone] 06/11/2014,5:04 PM

## 2014-06-11 NOTE — Progress Notes (Signed)
PCCM progress note  71M presented with hemetemesis on 1/18. Underwent upper endoscopy 1/19 and was found to have LA Class D esophagitis. He was continued on Protonix. Hemoglobin has been stable around 8-9 since 1/18. Hgb 7.7 this morning and receiving 1u pRBC. No hemetemesis since admission.  Noted LUE swelling today. Preliminary results from LUE venous duplex positive for deep vein thrombosis involving the left axillary and brachial veins.  Plan: -Heparin gtt without bolus -Follow CBC closely and watch for signs of bleeding -Will keep PICC for now and reassess tomorrow need for continuation of PICC  Discussed with attending, Dr. Wardell HeathByrum  Inza Mikrut, MD

## 2014-06-11 NOTE — Progress Notes (Signed)
06/11/2014- Respiratory care note- Pt suctioned and extubated at 1125 as per MD order.  JR 97, sats 100% on 4lpm cannula with resp rate of 28 post extubation.  RN at bedside for procedure.Pt tolerated well with pt vocalizing post extubation. Ambu bag and vent on standby at bedside.  Will monitor progress and wean as tolerated.

## 2014-06-11 NOTE — Progress Notes (Signed)
Notified for Heparin gtt from pharmacy. Gtt arrived and given to oncoming RN, Christian. Will start gtt asap.   Arlyss Weathersby R

## 2014-06-12 ENCOUNTER — Inpatient Hospital Stay (HOSPITAL_COMMUNITY): Payer: Medicare Other

## 2014-06-12 DIAGNOSIS — I82419 Acute embolism and thrombosis of unspecified femoral vein: Secondary | ICD-10-CM

## 2014-06-12 LAB — GLUCOSE, CAPILLARY
GLUCOSE-CAPILLARY: 182 mg/dL — AB (ref 70–99)
Glucose-Capillary: 132 mg/dL — ABNORMAL HIGH (ref 70–99)
Glucose-Capillary: 136 mg/dL — ABNORMAL HIGH (ref 70–99)
Glucose-Capillary: 163 mg/dL — ABNORMAL HIGH (ref 70–99)
Glucose-Capillary: 171 mg/dL — ABNORMAL HIGH (ref 70–99)
Glucose-Capillary: 193 mg/dL — ABNORMAL HIGH (ref 70–99)

## 2014-06-12 LAB — POCT I-STAT 3, ART BLOOD GAS (G3+)
ACID-BASE DEFICIT: 2 mmol/L (ref 0.0–2.0)
Bicarbonate: 20.6 mEq/L (ref 20.0–24.0)
O2 SAT: 75 %
TCO2: 21 mmol/L (ref 0–100)
pCO2 arterial: 26.6 mmHg — ABNORMAL LOW (ref 35.0–45.0)
pH, Arterial: 7.498 — ABNORMAL HIGH (ref 7.350–7.450)
pO2, Arterial: 35 mmHg — CL (ref 80.0–100.0)

## 2014-06-12 LAB — CBC
HCT: 30 % — ABNORMAL LOW (ref 39.0–52.0)
Hemoglobin: 10.1 g/dL — ABNORMAL LOW (ref 13.0–17.0)
MCH: 30.5 pg (ref 26.0–34.0)
MCHC: 33.7 g/dL (ref 30.0–36.0)
MCV: 90.6 fL (ref 78.0–100.0)
Platelets: 180 10*3/uL (ref 150–400)
RBC: 3.31 MIL/uL — ABNORMAL LOW (ref 4.22–5.81)
RDW: 16.3 % — ABNORMAL HIGH (ref 11.5–15.5)
WBC: 21.9 10*3/uL — ABNORMAL HIGH (ref 4.0–10.5)

## 2014-06-12 LAB — TYPE AND SCREEN
ABO/RH(D): O POS
Antibody Screen: NEGATIVE
UNIT DIVISION: 0

## 2014-06-12 LAB — BASIC METABOLIC PANEL
Anion gap: 8 (ref 5–15)
BUN: 31 mg/dL — AB (ref 6–23)
CALCIUM: 8.4 mg/dL (ref 8.4–10.5)
CHLORIDE: 115 mmol/L — AB (ref 96–112)
CO2: 25 mmol/L (ref 19–32)
Creatinine, Ser: 1.04 mg/dL (ref 0.50–1.35)
GFR calc Af Amer: 77 mL/min — ABNORMAL LOW (ref >90)
GFR, EST NON AFRICAN AMERICAN: 66 mL/min — AB (ref >90)
Glucose, Bld: 204 mg/dL — ABNORMAL HIGH (ref 70–99)
Potassium: 3.4 mmol/L — ABNORMAL LOW (ref 3.5–5.1)
SODIUM: 148 mmol/L — AB (ref 135–145)

## 2014-06-12 LAB — HEPARIN LEVEL (UNFRACTIONATED)
Heparin Unfractionated: 0.76 IU/mL — ABNORMAL HIGH (ref 0.30–0.70)
Heparin Unfractionated: 0.68 IU/mL (ref 0.30–0.70)

## 2014-06-12 MED ORDER — CHLORHEXIDINE GLUCONATE 0.12 % MT SOLN
15.0000 mL | Freq: Two times a day (BID) | OROMUCOSAL | Status: DC
  Administered 2014-06-12 – 2014-06-17 (×11): 15 mL via OROMUCOSAL
  Filled 2014-06-12 (×14): qty 15

## 2014-06-12 MED ORDER — POTASSIUM CHLORIDE 10 MEQ/50ML IV SOLN
10.0000 meq | INTRAVENOUS | Status: AC
  Administered 2014-06-12 (×2): 10 meq via INTRAVENOUS
  Filled 2014-06-12: qty 50

## 2014-06-12 MED ORDER — HEPARIN (PORCINE) IN NACL 100-0.45 UNIT/ML-% IJ SOLN
1150.0000 [IU]/h | INTRAMUSCULAR | Status: DC
  Administered 2014-06-12 – 2014-06-13 (×2): 1150 [IU]/h via INTRAVENOUS
  Filled 2014-06-12 (×2): qty 250

## 2014-06-12 MED ORDER — ENOXAPARIN SODIUM 80 MG/0.8ML ~~LOC~~ SOLN
80.0000 mg | Freq: Once | SUBCUTANEOUS | Status: DC
  Filled 2014-06-12: qty 0.8

## 2014-06-12 MED ORDER — ENOXAPARIN SODIUM 80 MG/0.8ML ~~LOC~~ SOLN
80.0000 mg | Freq: Two times a day (BID) | SUBCUTANEOUS | Status: DC
  Filled 2014-06-12: qty 0.8

## 2014-06-12 MED ORDER — CETYLPYRIDINIUM CHLORIDE 0.05 % MT LIQD
7.0000 mL | Freq: Two times a day (BID) | OROMUCOSAL | Status: DC
  Administered 2014-06-12 – 2014-06-17 (×9): 7 mL via OROMUCOSAL

## 2014-06-12 MED ORDER — ENALAPRIL MALEATE 10 MG PO TABS
10.0000 mg | ORAL_TABLET | Freq: Two times a day (BID) | ORAL | Status: DC
  Administered 2014-06-12 – 2014-06-14 (×4): 10 mg via ORAL
  Filled 2014-06-12 (×13): qty 1

## 2014-06-12 MED ORDER — POTASSIUM CHLORIDE 10 MEQ/100ML IV SOLN: 10.0000 meq | INTRAVENOUS | Status: AC

## 2014-06-12 NOTE — Progress Notes (Signed)
ANTICOAGULATION CONSULT NOTE - Follow Up Consult  Pharmacy Consult for Heparin  Indication: DVT, LUE  Allergies  Allergen Reactions  . Darvocet [Propoxyphene N-Acetaminophen] Nausea And Vomiting  . Penicillins Nausea And Vomiting    Patient Measurements: Weight: 172 lb 13.5 oz (78.4 kg)  Vital Signs: Temp: 98.7 F (37.1 C) (01/26 0356) Temp Source: Oral (01/26 0356) BP: 158/62 mmHg (01/26 0000) Pulse Rate: 90 (01/26 0000)  Labs:  Recent Labs  06/10/14 0400 06/11/14 0420 06/11/14 1647 06/11/14 1740 06/12/14 0344  HGB 8.1* 7.7* 10.1*  --  10.1*  HCT 23.2* 22.8* 29.5*  --  30.0*  PLT 135* 134* 142*  --  180  APTT  --   --   --  30  --   LABPROT  --   --   --  15.7*  --   INR  --   --   --  1.23  --   HEPARINUNFRC  --   --   --   --  0.76*  CREATININE 1.18 1.06  --   --  1.04    Estimated Creatinine Clearance: 57.6 mL/min (by C-G formula based on Cr of 1.04).   Assessment: Supra-therapeutic heparin level x 1, Hgb stable  Goal of Therapy:  Heparin level 0.3-0.7 units/ml Monitor platelets by anticoagulation protocol: Yes   Plan:  -Decrease heparin drip to 1150 units/hr -1200 HL -Daily CBC/HL -Trend Hgb  Johne Buckle 06/12/2014,4:50 AM

## 2014-06-12 NOTE — Progress Notes (Signed)
Patients family notified their eye doctor of the patients difference in pupil sizes. They are requesting an eye doctor come to see him if there is not a neurologic cause the his pupil change. They also added that the patient uses Refresh Optic Advance eye drops at home.   Marvin Leon R

## 2014-06-12 NOTE — Progress Notes (Signed)
PULMONARY / CRITICAL CARE MEDICINE   Name: Marvin Leon MRN: [id number] DOB: 14-Mar-1933    ADMISSION DATE:  06/04/2014 CONSULTATION DATE:  06/04/2014  REFERRING MD :  EDP  CHIEF COMPLAINT:  VDRF and upper GI bleed  INITIAL PRESENTATION: 81 year old male hx of arthritis, GERD, HLD, HTN, CAD s/p 3 stents, emphysema, PAD , DMII on daily ASA and plavix since 02/22/14 since DES  iliac stent placement 02/2014, but no NSAID use who presents to the hospital with hematemesis and AMS.  In the ED the patient was noted to not be protecting his airway and was intubated for airway protection.  OGT was placed with 1500 ml of coffee ground black material was noted.  PCCM was called to admit to the ICU.  No recent etoh history. Normal cscope and endoscope 2009.  STUDIES:  1/18  NGT lavaged with only coffee grounds, no active bleeding. 1/18  CT - mild atrophy.  1/18  KUB - normal. 1/19  EGD. LA class D esophagitis. this is the source of the bleeding. 1/20 Echo- EF 25%. Diffuse hypokinesis. No classic Takotsubo's, but function worse towards apex. No thrombi.  1/22 MRI Brain- small 5mm acute infarct in left occipital lobe- likely not cause for mental status changes 1/22 EEG- slow with triphasic waves, consistent with patient's condition. No seizures.   SIGNIFICANT EVENTS: 1/18  UGI bleed. hgb dropped. 1/19  EGD, weaned off pressor.  1/20  Failed extubation. Off pressors 1/22  Agitation overnight, on precedex / fentanyl, low grade fevers 1/25 Hbg 7.7, transfused 1 unit PRBCs 1/25 LUE Doppler +DVT, started on heparin gtt  SUBJECTIVE:  LUE doppler + DVT, started on heparin gtt yesterday. Overnight, patient became tachypenic to 30s with use of accessory muscles. Placed on BiPAP and improved. Patient comfortable on BiPAP this morning. Had BM last night.   VITAL SIGNS: Temp:  [97.6 F (36.4 C)-99.5 F (37.5 C)] 98.7 F (37.1 C) (01/26 0356) Pulse Rate:  [38-115] 99 (01/26 0700) Resp:  [18-33] 25 (01/26  0700) BP: (123-165)/(55-101) 150/72 mmHg (01/26 0700) SpO2:  [75 %-100 %] 100 % (01/26 0700) FiO2 (%):  [40 %] 40 % (01/26 0600)   HEMODYNAMICS:     VENTILATOR SETTINGS: Vent Mode:  [-] BIPAP FiO2 (%):  [40 %] 40 % Set Rate:  [16 bmp] 16 bmp Vt Set:  [600 mL] 600 mL PEEP:  [5 cmH20-6 cmH20] 6 cmH20 Pressure Support:  [6 cmH20-8 cmH20] 6 cmH20 Plateau Pressure:  [23 cmH20] 23 cmH20   INTAKE / OUTPUT:  Intake/Output Summary (Last 24 hours) at 06/12/14 0759 Last data filed at 06/12/14 0700  Gross per 24 hour  Intake 2696.4 ml  Output   3760 ml  Net -1063.6 ml    PHYSICAL EXAMINATION: General: alert, awake, NAD Neuro: alert and oriented x 3, follows commands HEENT:  [place]/AT, EOMI Cardiovascular: RRR, no m/g/r Lungs: CTA bilaterally, breaths non-labored on BiPAP Abdomen: BS+, soft, non-distended, non-tender Musculoskeletal: no acute deformities Skin:  Warm, dry, no edema   LABS:  CBC  Recent Labs Lab 06/11/14 0420 06/11/14 1647 06/12/14 0344  WBC 16.4* 20.5* 21.9*  HGB 7.7* 10.1* 10.1*  HCT 22.8* 29.5* 30.0*  PLT 134* 142* 180   Coag's  Recent Labs Lab 06/11/14 1740  APTT 30  INR 1.23   BMET  Recent Labs Lab 06/10/14 0400 06/11/14 0420 06/12/14 0344  NA 148* 149* 148*  K 3.4* 3.5 3.4*  CL 118* 118* 115*  CO2 22 23 25  BUN 37* 38* 31*  CREATININE 1.18 1.06 1.04  GLUCOSE 241* 214* 204*   Electrolytes  Recent Labs Lab 06/07/14 0450  06/10/14 0400 06/11/14 0420 06/12/14 0344  CALCIUM 8.7  < > 8.2* 8.1* 8.4  MG 1.6  --  1.7 1.9  --   PHOS 2.7  --  1.7* 2.8  --   < > = values in this interval not displayed.   Sepsis Markers  Recent Labs Lab 06/08/14 0800 06/09/14 0430 06/10/14 0400  PROCALCITON 2.70 2.08 1.24     ABG  Recent Labs Lab 06/12/14 0640  PHART 7.498*  PCO2ART 26.6*  PO2ART 35.0*   Liver Enzymes   Recent Labs Lab 06/06/14 0420  AST 26  ALT 14  ALKPHOS 38*  BILITOT 0.6  ALBUMIN 2.1*   Cardiac  Enzymes  Recent Labs Lab 06/06/14 1140 06/07/14 0008 06/07/14 0450  TROPONINI 1.51* 1.14* 1.00*   Glucose  Recent Labs Lab 06/11/14 0737 06/11/14 1150 06/11/14 1536 06/11/14 1921 06/12/14 0015 06/12/14 0355  GLUCAP 159* 108* 151* 145* 171* 163*    Imaging Dg Chest Port 1 View  06/11/2014   CLINICAL DATA:  Shortness of breath  EXAM: PORTABLE CHEST - 1 VIEW  COMPARISON:  06/10/2014  FINDINGS: Endotracheal tube with the tip 4 cm above the carina. Left subclavian central venous catheter with the tip projecting over the SVC. Feeding to coursing below the diaphragm with the tip excluded from the field of view. Bilateral mild interstitial thickening. Possible trace left pleural effusion. No pneumothorax. Stable cardiomediastinal silhouette. No acute osseous abnormality.  IMPRESSION: 1. Endotracheal tube with the tip 4 cm above the carina. 2. Bilateral interstitial and alveolar airspace opacities concerning for mild interstitial edema versus infection. No significant interval change compared with the prior exam.   Electronically Signed   By: Elige KoHetal  Patel   On: 06/11/2014 08:01     ASSESSMENT / PLAN:  PULMONARY OETT 1/18>>>1/25 A:  VDRF - due to AMS, hematemesis- Resolved. Extubated 1/25.  Aspiration pneumonia- Resolving  ?COPD- heavy smoking history P:   Doing well on nasal cannula Continue Brovana, Pulmicort, Duonebs   CARDIOVASCULAR L IJ TLC 1/18 >>1/24 PICC 1/24>> 1/25 A:  Sinus Tachycardia -  with occasional wide complex tachy - likely 2/2 to blood loss, sepsis, shock and NSTEMI with peak Tp 1.5- resolving  Septic shock - improved, off pressors CAD Troponin elevation - likely 2/2 to demand/shock > improved Severe systolic CHF EF 25% on ECHO 06/06/14. ?Takotsubo appearance DVT in LUE P:  Continue heparin gtt Remove PICC line due to DVT, ensure peripheral access Continue plavix  Continue coreg 12.5 mg BID, vasotec 2.5 mg BID per cardiology Consider repeat ECHO when  patient improves from current episode. May need to have ICD if EF remains this low. Continue Metoprolol 2.5 mg IV PRN HR >125  RENAL A:   Acute on Chronic Renal Failure - baseline sr cr ~ 1.4,  Acute rise with acute blood loss vs septic shock. - Resolved Hypomagnesemia- improving  Hypokalemia  Hypophosphatemia - improving  Hypernatremia P:   Trend BMP Replace electrolytes as indicated  Free water added for hyperNa   GASTROINTESTINAL A:   LA class D esophagitis - identified source of the bleeding per EGD report.  P:   Protonix BID, needs daily indefinitely  Continue plavix, monitor closely for new bleeding  Continue TFs  HEMATOLOGIC A:   Acute on Chronic Anemia - baseline hgb ~ 12-13, likely from esophagitis seen on EGD. No  more drop, stable now. Chronic Thrombocytopenia    DVT in LUE P:  Continue heparin gtt CBC daily  Transfuse goal hgb <8 since has Cardiac disease. DVT ppx:  Heparin gtt, SCD's   INFECTIOUS A:  Fever - Improving. Unknown etiology- possibly central line, taken out 1/24. Afebrile o/ 48 hrs. However, WBC count trending up- likely due to DVT Aspiration pneumonia - improving per CXR P:   BCx2 1/18 >> negative UC 1/18 >> negative Sputum 1/18 >> negative  CSF 1/21 >> negative L IJ tip culture 1/24>>  Cefepime 1/18 >>1/20 Flagyl 1/18 >> 1/23 Ceftriaxone 1/20 >> 1/24   PCT down to 1 from 27 Afebrile for 48 hours, but WBC trending up likely due to DVT. Continue to monitor.   ENDOCRINE A:   DM.   AI ruled out P:   SSI    NEUROLOGIC A:   Acute encephalopathy - in setting of shock, blood loss. Continues to improve.  Agitation - resolved.  Likely opiate dependance since he is on large dose at home. P:   Ativan PRN since was on benzo at home. Would restart home xanax when takes PO.  Mobilize / PT as able    FAMILY  - Updates: no family available at bedside 1/23.    Transfer to SDU today.     Rich Number, MD Internal Medicine Resident,  PGY-1   Attending:  I have seen and examined the patient with nurse practitioner/resident and agree with the note above.   On exam Mr Haque is stronger this morning, lungs clear CV: RRR, no mgr  He needed BIPAP briefly last night for tachypnea, but lungs clear on exam this mornign, breathing comforably  Transfer to SDU today, PCCM service  Heber [place], MD [place] [facility] Pager: [phone] Cell: [phone] If no response, call [phone]

## 2014-06-12 NOTE — Progress Notes (Signed)
PROGRESS NOTE  Subjective:   Marvin Leon is a 81 yo with hx of CAD, carotid artery diesease admitted with GI bleed   Developed coffee ground emesis,  Developed sinus tach . Mild elevation of Troponin   Echo shows  Severe LV dysfunction with EF of 25%. Normal right sided function  Objective:    Vital Signs:   Temp:  [98.7 F (37.1 C)-99.5 F (37.5 C)] 98.7 F (37.1 C) (01/26 0356) Pulse Rate:  [86-112] 86 (01/26 1030) Resp:  [18-32] 28 (01/26 1130) BP: (132-165)/(54-97) 132/57 mmHg (01/26 1130) SpO2:  [75 %-100 %] 96 % (01/26 1030) FiO2 (%):  [40 %] 40 % (01/26 0910)  Last BM Date: 06/12/14   24-hour weight change: Weight change:   Weight trends: Filed Weights   06/09/14 0400 06/10/14 0414 06/11/14 0500  Weight: 170 lb 6.7 oz (77.3 kg) 171 lb 1.2 oz (77.6 kg) 172 lb 13.5 oz (78.4 kg)    Intake/Output:  01/25 0701 - 01/26 0700 In: 2696.4 [I.V.:671.4; Blood:335; NG/GT:1690] Out: 3760 [Urine:3760] Total I/O In: 141.5 [I.V.:51.5; NG/GT:90] Out: 575 [Urine:575]   Physical Exam: BP 132/57 mmHg  Pulse 86  Temp(Src) 98.7 F (37.1 C) (Oral)  Resp 28  Wt 172 lb 13.5 oz (78.4 kg)  SpO2 96%  Wt Readings from Last 3 Encounters:  06/11/14 172 lb 13.5 oz (78.4 kg)  03/20/14 162 lb 8 oz (73.71 kg)  02/23/14 165 lb 9.1 oz (75.1 kg)    General: Vital signs reviewed and noted. Chronically ill appearing   Head: Normocephalic, atraumatic.  Eyes: conjunctivae/corneas clear.  EOM's intact.   Throat: normal  Neck:  normal   Lungs:    clear ant.   Heart:  Rr   Abdomen:  Soft, non-tender, non-distended    Extremities: Trace edema   Neurologic: A&O X3, CN II - XII are grossly intact. , slow to respond   Psych: Normal     Labs: BMET:  Recent Labs  06/10/14 0400 06/11/14 0420 06/12/14 0344  NA 148* 149* 148*  K 3.4* 3.5 3.4*  CL 118* 118* 115*  CO2 GLUCOSE 241* 214* 204*  BUN 37* 38* 31*  CREATININE 1.18 1.06 1.04  CALCIUM 8.2* 8.1* 8.4  MG 1.7  1.9  --   PHOS 1.7* 2.8  --     Liver function tests: No results for input(s): AST, ALT, ALKPHOS, BILITOT, PROT, ALBUMIN in the last 72 hours. No results for input(s): LIPASE, AMYLASE in the last 72 hours.  CBC:  Recent Labs  06/11/14 1647 06/12/14 0344  WBC 20.5* 21.9*  HGB 10.1* 10.1*  HCT 29.5* 30.0*  MCV 91.9 90.6  PLT 142* 180    Cardiac Enzymes: No results for input(s): CKTOTAL, CKMB, TROPONINI in the last 72 hours.  Coagulation Studies:  Recent Labs  06/11/14 1740  LABPROT 15.7*  INR 1.23    Other: Invalid input(s): POCBNP No results for input(s): DDIMER in the last 72 hours.  Recent Labs  06/10/14 0400  HGBA1C 6.4*    Recent Labs  06/10/14 0400  CHOL 63  HDL 6*  LDLCALC 16  TRIG 203*  CHOLHDL 10.5   No results for input(s): TSH, T4TOTAL, T3FREE, THYROIDAB in the last 72 hours.  Invalid input(s): FREET3 No results for input(s): VITAMINB12, FOLATE, FERRITIN, TIBC, IRON, RETICCTPCT in the last 72 hours.   Other results:  EKG :  Sinus tach, ant. MI.   Medications:    Infusions: .  sodium chloride 20 mL/hr at 06/12/14 0800  . dexmedetomidine Stopped (06/11/14 1000)  . feeding supplement (VITAL AF 1.2 CAL) 1,000 mL (06/12/14 0800)  . heparin 1,150 Units/hr (06/12/14 0800)    Scheduled Medications: . antiseptic oral rinse  7 mL Mouth Rinse q12n4p  . arformoterol  15 mcg Nebulization BID  . budesonide (PULMICORT) nebulizer solution  0.5 mg Nebulization BID  . carvedilol  25 mg Oral BID WC  . chlorhexidine  15 mL Mouth Rinse BID  . clopidogrel  75 mg Oral Daily  . docusate  100 mg Per Tube Daily  . enalapril  5 mg Oral BID  . free water  200 mL Per Tube 3 times per day  . insulin aspart  2-6 Units Subcutaneous 6 times per day  . pantoprazole (PROTONIX) IV  40 mg Intravenous Q12H  . potassium chloride  10 mEq Intravenous Q1 Hr x 2  . sodium chloride  10-40 mL Intracatheter Q12H    Assessment/ Plan:      1. Troponin elevation.  I  suspect this was due to demand ischemia.   He will need to improve significantly before we start any cardiology work up. Doubt this was an acute coronary event.  2. Acute systolic CHF:  Continue supportive care.  He may have had a "takotsubo " like event.  Will continue to titrate up his coreg.  Will change Vasotec from 5 BID to 10 mg BID .   He continues to make slow and steady progress.     Disposition:  Length of Stay: 8  Vesta MixerPhilip J. Raffi Milstein, Montez HagemanJr., MD, [facility] SystemFACC 06/12/2014, 12:45 PM Office [phone] Pager [phone]

## 2014-06-12 NOTE — Progress Notes (Signed)
IV team unable to place peripheral IV. Three different IV nurses have attempted with and without ultrasound. Patient has been stuck 4 times for a peripheral IV without success. Verbal order from MD to continue use of existing PICC for now for the patient to continue heparin drip. Will reevaluate and continue to monitor.   Rhylynn Perdomo R

## 2014-06-12 NOTE — Progress Notes (Signed)
ANTICOAGULATION CONSULT NOTE - FOLLOW-UP  Pharmacy Consult for Heparin  Indication: DVT, LUE  Allergies  Allergen Reactions  . Darvocet [Propoxyphene N-Acetaminophen] Nausea And Vomiting  . Penicillins Nausea And Vomiting    Patient Measurements: Weight: 172 lb 13.5 oz (78.4 kg)  Vital Signs: Temp: 98.7 F (37.1 C) (01/26 0356) Temp Source: Oral (01/26 0356) BP: 132/57 mmHg (01/26 1130) Pulse Rate: 86 (01/26 1030)  Labs:  Recent Labs  06/10/14 0400 06/11/14 0420 06/11/14 1647 06/11/14 1740 06/12/14 0344 06/12/14 1200  HGB 8.1* 7.7* 10.1*  --  10.1*  --   HCT 23.2* 22.8* 29.5*  --  30.0*  --   PLT 135* 134* 142*  --  180  --   APTT  --   --   --  30  --   --   LABPROT  --   --   --  15.7*  --   --   INR  --   --   --  1.23  --   --   HEPARINUNFRC  --   --   --   --  0.76* 0.68  CREATININE 1.18 1.06  --   --  1.04  --     Estimated Creatinine Clearance: 57.6 mL/min (by C-G formula based on Cr of 1.04).  Assessment: [age] YOM presented on 1/18 with VDRF possibly due to shock/NSTEMI, found to have an upper GIB, & was tachycardiac. 1/25 dopplers show DVT of the left brachial and axillary veins. Pharmacy consulted to dose heparin. The heparin level today is therapeutic at 0.68 IU/mL. H/H low but increased from presentation & PLT WNL. No signs or symptoms of bleeding noted.   Goal of Therapy:  Heparin level 0.3-0.7 units/ml Monitor platelets by anticoagulation protocol: Yes   Plan:  -Continue heparin drip 1150 units/hr -Daily CBC and heparin level -Monitor H/H, PLT, and signs and symptoms of bleeding  Glendell Dockerooke, Tommi J 06/12/2014,12:56 PM   I agree with above assessment and plan.  Christoper Fabianaron Govind Furey, PharmD, BCPS Clinical pharmacist, pager [phone] 06/12/2014 1:10 PM

## 2014-06-12 NOTE — Care Management Note (Signed)
    Page 1 of 2   06/12/2014     1:47:07 PM CARE MANAGEMENT NOTE 06/12/2014  Patient:  Marvin Leon,Marvin Leon   Account Number:  [id number]  Date Initiated:  06/05/2014  Documentation initiated by:  [facility],Marvin Leon  Subjective/Objective Assessment:   Admitted with GIB - AMS - intubated for airway protection.     Action/Plan:   Anticipated DC Date:  06/12/2014   Anticipated DC Plan:  HOME W HOME HEALTH SERVICES      DC Planning Services  CM consult      Choice offered to / List presented to:             Status of service:  In process, will continue to follow Medicare Important Message given?  YES (If response is "NO", the following Medicare IM given date fields will be blank) Date Medicare IM given:  06/12/2014 Medicare IM given by:  [facility] Date Additional Medicare IM given:   Additional Medicare IM given by:    Discharge Disposition:    Per UR Regulation:  Reviewed for med. necessity/level of care/duration of stay  If discussed at Long Length of Stay Meetings, dates discussed:    Comments:  Contact:  Leon,Marvin Daughter [phone]     [facility]Whicker,Marvin Daughter [phone]  06-12-14 9:40am Marvin ArenasSarah Almin Leon, RNBSN - [phone] Off vent - sitting up in chair.  PT worked with today.  No notes yet.  Nurse states very weak.  Prior to admission patient lived at home with wife who has 8 hour per day care [place]  Family lives close.  Confirmed with daughter Marvin Leon.  Discussed need for rehab prior to dc home.  patient agrees will need this as does daughter. Would like to see him go to CIR if qualifies.  As back up plan discussed [facility] SNF for rehab - not sure they want this - definitely not [facility].  CM will continue to follow. Left message for Marvin MessierKathy - other daughter - to call me back.  Benefits check for lovenox. Called [facility] at [phone]. Talked to CSR Bari EdwardBen Leon. LOVENOX is covered, bur a PRIOR AUTHORIZATION is Required. Patient's co-payment/co-insurance will be 25% of the  medication's total cost at retail pharmacies per [place]. The retail [facility]'s [facility]  06-11-14 10:26am Marvin ArenasSarah Christofer Leon, RNBSN [phone]- [phone] Remains on vent.   NSTEMI and small occipital infarct.

## 2014-06-12 NOTE — Progress Notes (Signed)
ABG that is in for 06:40 is venous. CCM resident made aware & they are okay with venous gas. No new orders at this time to redraw for arterial blood.

## 2014-06-12 NOTE — Evaluation (Signed)
Physical Therapy Evaluation Patient Details Name: Marvin Leon MRN: [id number] DOB: 1932-10-04 Today's Date: 06/12/2014   History of Present Illness  81 year old male hx of arthritis, GERD, HLD, HTN, CAD s/p 3 stents, emphysema, PAD , DMII  who presents to the hospital with hematemesis and AMS.  In the ED the patient was noted to not be protecting his airway and was intubated for airway protection. ETT 1/18-1/25  Clinical Impression  Pt pleasant but easily distracted and reports he and wife are not in the best health and wife cannot physically assist at home. Pt with ability to walk short distance in room prior to fatigue and will benefit from acute therapy to address deficits listed below, maximize mobility and gait to decrease burden of care.      Follow Up Recommendations SNF;Supervision for mobility/OOB    Equipment Recommendations  3in1 (PT)    Recommendations for Other Services OT consult     Precautions / Restrictions Precautions Precautions: Fall      Mobility  Bed Mobility Overal bed mobility: Needs Assistance Bed Mobility: Supine to Sit     Supine to sit: Min assist;HOB elevated     General bed mobility comments: cues for sequence with assist to pivot legs to EOB and elevate trunk from surface  Transfers Overall transfer level: Needs assistance   Transfers: Sit to/from Stand Sit to Stand: Mod assist         General transfer comment: cues for hand placement, sequence and safety  Ambulation/Gait Ambulation/Gait assistance: Min assist;+2 safety/equipment Ambulation Distance (Feet): 10 Feet Assistive device: Rolling walker (2 wheeled) Gait Pattern/deviations: Shuffle;Trunk flexed   Gait velocity interpretation: Below normal speed for age/gender General Gait Details: chair followed for safety and fatigue with cues throughout for posture, looking up, breathing technique and position in Rw  Stairs            Wheelchair Mobility    Modified Rankin  (Stroke Patients Only)       Balance Overall balance assessment: Needs assistance   Sitting balance-Leahy Scale: Fair Sitting balance - Comments: initial posterior lean with min assist to maintain and then able to maintain minguard Postural control: Posterior lean   Standing balance-Leahy Scale: Poor                               Pertinent Vitals/Pain Pain Assessment: No/denies pain    Home Living Family/patient expects to be discharged to:: Private residence Living Arrangements: Spouse/significant other Available Help at Discharge: Family;Available PRN/intermittently Type of Home: House Home Access: Stairs to enter   Entrance Stairs-Number of Steps: 1 Home Layout: One level Home Equipment: Walker - 2 wheels;Cane - single point;Grab bars - tub/shower;Wheelchair - manual      Prior Function Level of Independence: Independent         Comments: pt states he was caring for himself but at other times states he and wife use WC. Unclear true PLOF and family not present to confirm     Hand Dominance        Extremity/Trunk Assessment   Upper Extremity Assessment: Generalized weakness           Lower Extremity Assessment: Generalized weakness      Cervical / Trunk Assessment: Normal  Communication   Communication: No difficulties  Cognition Arousal/Alertness: Awake/alert Behavior During Therapy: Flat affect Overall Cognitive Status: Impaired/Different from baseline Area of Impairment: Memory;Safety/judgement;Following commands     Memory:  Decreased short-term memory Following Commands: Follows one step commands consistently;Follows one step commands with increased time Safety/Judgement: Decreased awareness of safety;Decreased awareness of deficits          General Comments      Exercises        Assessment/Plan    PT Assessment Patient needs continued PT services  PT Diagnosis Generalized weakness;Difficulty walking   PT Problem  List Decreased strength;Decreased activity tolerance;Decreased mobility;Decreased safety awareness;Cardiopulmonary status limiting activity;Decreased balance  PT Treatment Interventions Gait training;Therapeutic exercise;Therapeutic activities;DME instruction;Functional mobility training;Balance training;Patient/family education   PT Goals (Current goals can be found in the Care Plan section) Acute Rehab PT Goals Patient Stated Goal: return to fishing PT Goal Formulation: With patient Time For Goal Achievement: 06/26/14 Potential to Achieve Goals: Fair    Frequency Min 3X/week   Barriers to discharge Decreased caregiver support      Co-evaluation               End of Session Equipment Utilized During Treatment: Gait belt;Oxygen Activity Tolerance: Patient tolerated treatment well Patient left: in chair;with call bell/phone within reach Nurse Communication: Mobility status         Time: 1610-9604 PT Time Calculation (min) (ACUTE ONLY): 23 min   Charges:   PT Evaluation $Initial PT Evaluation Tier I: 1 Procedure PT Treatments $Gait Training: 8-22 mins   PT G Codes:        Delorse Lek 06/12/2014, 10:29 AM Delaney Meigs, PT [phone]

## 2014-06-12 NOTE — Progress Notes (Signed)
At 14:40 patients pupils noted to be unequal. MD notified immediately and is at bedside now. Vital signs stable, patient in no distress at this time. Will continue to monitor. Portable CT ordered now.   Annalisse Minkoff R

## 2014-06-12 NOTE — Consult Note (Signed)
  Ophthalmology Consult  This is a 81 yo gentleman admitted for GI bleed and respiratory issues who was noticed to have anisocoria.  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  PMHx  Respiratory distress GI bleed  Allergies to Darvocet and PCN  Problem list PVD (peripheral vascular disease) CAD (coronary artery disease) Acute respiratory failure UGIB (upper gastrointestinal bleed) Tachycardia Demand ischemia Acute renal failure syndrome Acute upper GI bleeding Encephalopathy acute Acute systolic HF (heart failure) Dvt femoral (deep venous thrombosis) Non-Hospital GERD (gastroesophageal reflux disease) Constipation Abdominal pain Nausea and vomiting in adult patient DM (diabetes mellitus) type II uncontrolled, periph vascular disorder HTN (hypertension) Hyperlipemia BPH (benign prostatic hyperplasia) Gastroparesis due to DM Diabetic gastroparesis Gallbladder disease Dizziness and giddiness Claudication Acute respiratory failure with hypoxia   On exam pt was 20/80 with near card in both eyes but poor cooperation.  Pupils were abnormal with right eye being 8 in dark with reaction to 5 and left eye being 6-7 in dark with reaction to 3.  No APD seen.  Extraocular motility intact.  IOP was 14 OD and 15 OS.  On slit lamp exam pt had dermatochalasis in both eyes, conjunctiva and sclera wnl ou.  Cornea clear ou and iris reactive and round ou.  Pt with 2-3+Nuclear sclerotic cataracts in both eyes.  On undilated exam retina was flat and intact with no optic nerve edema in either eye.    A/P 1. Anisocoria with slight increased difference in light versus dark.  However no APD seen.  This could be physiologic versus Adies pupil.  The patient has not had dilating drops so would not be pharmacologic plus pt does have good reactivity in both eyes.  A CN III palsy could present in this fashion however doubt would have this good of a light response and no other signs with extraocular  motility intact and no ptosis etc.  Also pt had CT scan which was normal.  This does not appear to be a horners based on anisicoria is not worse in dark.  However if pt has any complaints of neck pain etc or if anisocoria worsens then could evaluate pathway with CT neck and chest.  If is stable during hospital stay then patient can follow up with outpatient eye doctor and a diluted pilocarpine test could be done at that time to evaluate Adies pupil if needed.  Please reconsult if any changes in ocular condition occurs.  Thank you for allowing me to participate in the care of this patient.  Please feel free to contact me if you have any concerns.  Mia Creekimothy Afsana Liera, M.D. (cell) [phone] (office) [phone]

## 2014-06-12 NOTE — Progress Notes (Deleted)
ANTICOAGULATION CONSULT NOTE - FOLLOW-UP  Pharmacy Consult for Heparin >> Lovenox Indication: DVT, LUE  Allergies  Allergen Reactions  . Darvocet [Propoxyphene N-Acetaminophen] Nausea And Vomiting  . Penicillins Nausea And Vomiting    Patient Measurements: Weight: 172 lb 13.5 oz (78.4 kg)  Vital Signs: Temp: 98.7 F (37.1 C) (01/26 0356) Temp Source: Oral (01/26 0356) BP: 132/57 mmHg (01/26 1130) Pulse Rate: 86 (01/26 1030)  Labs:  Recent Labs  06/10/14 0400 06/11/14 0420 06/11/14 1647 06/11/14 1740 06/12/14 0344 06/12/14 1200  HGB 8.1* 7.7* 10.1*  --  10.1*  --   HCT 23.2* 22.8* 29.5*  --  30.0*  --   PLT 135* 134* 142*  --  180  --   APTT  --   --   --  30  --   --   LABPROT  --   --   --  15.7*  --   --   INR  --   --   --  1.23  --   --   HEPARINUNFRC  --   --   --   --  0.76* 0.68  CREATININE 1.18 1.06  --   --  1.04  --     Estimated Creatinine Clearance: 57.6 mL/min (by C-G formula based on Cr of 1.04).  Assessment: [age] YOM presented on 1/18 with VDRF possibly due to shock/NSTEMI, found to have an upper GIB, & was tachycardiac. 1/25 dopplers show DVT of the left brachial and axillary veins. Pharmacy consulted to dose heparin. The heparin level today is therapeutic at 0.68 IU/mL. However, line access is questionable. H/H low but increased from presentation & PLT WNL. No signs or symptoms of bleeding noted.   Goal of Therapy:  Heparin level 0.6-1.0 unit/mL   Plan:  -Discontinue heparin drip -Start Lovenox 80mg  SQ q12h (wait 1 hour after stopping heparin drip) -Daily CBC  -Heparin level if needed -Monitor H/H, PLT, and signs and symptoms of bleeding  Thomasene RippleCooke, Pier Bosher J 06/12/2014,2:02 PM

## 2014-06-13 ENCOUNTER — Inpatient Hospital Stay (HOSPITAL_COMMUNITY): Payer: Medicare Other

## 2014-06-13 LAB — URINALYSIS, ROUTINE W REFLEX MICROSCOPIC
Bilirubin Urine: NEGATIVE
Glucose, UA: NEGATIVE mg/dL
Hgb urine dipstick: NEGATIVE
KETONES UR: NEGATIVE mg/dL
LEUKOCYTES UA: NEGATIVE
NITRITE: NEGATIVE
PH: 5.5 (ref 5.0–8.0)
PROTEIN: NEGATIVE mg/dL
SPECIFIC GRAVITY, URINE: 1.009 (ref 1.005–1.030)
Urobilinogen, UA: 1 mg/dL (ref 0.0–1.0)

## 2014-06-13 LAB — BASIC METABOLIC PANEL
ANION GAP: 7 (ref 5–15)
BUN: 30 mg/dL — AB (ref 6–23)
CALCIUM: 8.3 mg/dL — AB (ref 8.4–10.5)
CO2: 24 mmol/L (ref 19–32)
CREATININE: 0.98 mg/dL (ref 0.50–1.35)
Chloride: 115 mmol/L — ABNORMAL HIGH (ref 96–112)
GFR calc Af Amer: 88 mL/min — ABNORMAL LOW (ref >90)
GFR calc non Af Amer: 76 mL/min — ABNORMAL LOW (ref >90)
Glucose, Bld: 240 mg/dL — ABNORMAL HIGH (ref 70–99)
Potassium: 4 mmol/L (ref 3.5–5.1)
SODIUM: 146 mmol/L — AB (ref 135–145)

## 2014-06-13 LAB — GLUCOSE, CAPILLARY
GLUCOSE-CAPILLARY: 192 mg/dL — AB (ref 70–99)
GLUCOSE-CAPILLARY: 156 mg/dL — AB (ref 70–99)
GLUCOSE-CAPILLARY: 163 mg/dL — AB (ref 70–99)
Glucose-Capillary: 145 mg/dL — ABNORMAL HIGH (ref 70–99)
Glucose-Capillary: 200 mg/dL — ABNORMAL HIGH (ref 70–99)
Glucose-Capillary: 177 mg/dL — ABNORMAL HIGH (ref 70–99)

## 2014-06-13 LAB — CBC
HCT: 30.5 % — ABNORMAL LOW (ref 39.0–52.0)
Hemoglobin: 10 g/dL — ABNORMAL LOW (ref 13.0–17.0)
MCH: 30.4 pg (ref 26.0–34.0)
MCHC: 32.8 g/dL (ref 30.0–36.0)
MCV: 92.7 fL (ref 78.0–100.0)
Platelets: 232 10*3/uL (ref 150–400)
RBC: 3.29 MIL/uL — ABNORMAL LOW (ref 4.22–5.81)
RDW: 16.2 % — ABNORMAL HIGH (ref 11.5–15.5)
WBC: 21.3 10*3/uL — ABNORMAL HIGH (ref 4.0–10.5)

## 2014-06-13 LAB — CATH TIP CULTURE: Culture: NO GROWTH

## 2014-06-13 LAB — PROCALCITONIN: Procalcitonin: 0.29 ng/mL

## 2014-06-13 LAB — HEPARIN LEVEL (UNFRACTIONATED): HEPARIN UNFRACTIONATED: 0.61 [IU]/mL (ref 0.30–0.70)

## 2014-06-13 MED ORDER — HEPARIN (PORCINE) IN NACL 100-0.45 UNIT/ML-% IJ SOLN
1100.0000 [IU]/h | INTRAMUSCULAR | Status: DC
  Administered 2014-06-16 – 2014-06-18 (×3): 1100 [IU]/h via INTRAVENOUS
  Filled 2014-06-13 (×5): qty 250

## 2014-06-13 MED ORDER — FUROSEMIDE 10 MG/ML IJ SOLN: 40.0000 mg | Freq: Four times a day (QID) | INTRAMUSCULAR | Status: DC

## 2014-06-13 MED ORDER — LORAZEPAM 2 MG/ML IJ SOLN
0.5000 mg | Freq: Four times a day (QID) | INTRAMUSCULAR | Status: DC | PRN
  Administered 2014-06-13 – 2014-06-18 (×6): 0.5 mg via INTRAVENOUS
  Filled 2014-06-13 (×6): qty 1

## 2014-06-13 MED ORDER — PIPERACILLIN-TAZOBACTAM 3.375 G IVPB
3.3750 g | Freq: Three times a day (TID) | INTRAVENOUS | Status: AC
  Administered 2014-06-13 – 2014-06-17 (×14): 3.375 g via INTRAVENOUS
  Filled 2014-06-13 (×16): qty 50

## 2014-06-13 MED ORDER — VANCOMYCIN HCL 10 G IV SOLR
1250.0000 mg | Freq: Once | INTRAVENOUS | Status: AC
  Administered 2014-06-13: 1250 mg via INTRAVENOUS
  Filled 2014-06-13 (×2): qty 1250

## 2014-06-13 MED ORDER — FUROSEMIDE 10 MG/ML IJ SOLN
40.0000 mg | Freq: Four times a day (QID) | INTRAMUSCULAR | Status: AC
  Administered 2014-06-13 (×2): 40 mg via INTRAVENOUS
  Filled 2014-06-13 (×2): qty 4

## 2014-06-13 MED ORDER — VANCOMYCIN HCL IN DEXTROSE 1-5 GM/200ML-% IV SOLN
1000.0000 mg | Freq: Two times a day (BID) | INTRAVENOUS | Status: DC
  Administered 2014-06-14 – 2014-06-15 (×4): 1000 mg via INTRAVENOUS
  Filled 2014-06-13 (×5): qty 200

## 2014-06-13 NOTE — Progress Notes (Signed)
Pt is off BiPAP at this time. Pt is not SOB, WOB is normal. BBS are clear/diminished. Pt is on 6L [place], Sats 100%.

## 2014-06-13 NOTE — Progress Notes (Signed)
Accidentally wasted dose of Ativan (0.5mg )  in sharps box and had to retreive another dose from [employer]. Wasted with Lamount CrankerKelli Hunt RN

## 2014-06-13 NOTE — Progress Notes (Signed)
PROGRESS NOTE  Subjective:   [name] is a 81 yo with hx of CAD, carotid artery diesease admitted with GI bleed   Developed coffee ground emesis,  Developed sinus tach . Mild elevation of Troponin   Echo shows  Severe LV dysfunction with EF of 25%. Normal right sided function We have been gradually increasing his CHF meds   Objective:    Vital Signs:   Temp:  [97.7 F (36.5 C)-98.8 F (37.1 C)] 97.7 F (36.5 C) (01/27 1300) Pulse Rate:  [38-110] 92 (01/27 1100) Resp:  [21-39] 36 (01/27 1200) BP: (122-170)/(53-103) 154/103 mmHg (01/27 1200) SpO2:  [94 %-100 %] 100 % (01/27 1100) FiO2 (%):  [40 %] 40 % (01/27 1250) Weight:  [169 lb 8.5 oz (76.9 kg)] 169 lb 8.5 oz (76.9 kg) (01/27 0500)  Last BM Date: 06/12/14   24-hour weight change: Weight change: 169 lb 8.4 oz (76.897 kg)  Weight trends: Filed Weights   06/11/14 0500 06/12/14 0500 06/13/14 0500  Weight: 172 lb 13.5 oz (78.4 kg) 0.1 oz (0.003 kg) 169 lb 8.5 oz (76.9 kg)    Intake/Output:  01/26 0701 - 01/27 0700 In: 2840.3 [I.V.:755.3; NG/GT:2085] Out: 1180 [Urine:1180] Total I/O In: 486 [I.V.:126; NG/GT:360] Out: -    Physical Exam: BP 154/103 mmHg  Pulse 92  Temp(Src) 97.7 F (36.5 C) (Oral)  Resp 36  Wt 169 lb 8.5 oz (76.9 kg)  SpO2 100%  Wt Readings from Last 3 Encounters:  06/13/14 169 lb 8.5 oz (76.9 kg)  03/20/14 162 lb 8 oz (73.71 kg)  02/23/14 165 lb 9.1 oz (75.1 kg)    General: Vital signs reviewed and noted. Chronically ill appearing   Head: Normocephalic, atraumatic.  Eyes: conjunctivae/corneas clear.  EOM's intact.   Throat: normal  Neck:  normal   Lungs:    clear ant.   Heart:  Rr   Abdomen:  Soft, non-tender, non-distended    Extremities: Trace edema   Neurologic: A&O X3, CN II - XII are grossly intact. , slow to respond   Psych: Normal     Labs: BMET:  Recent Labs  06/11/14 0420 06/12/14 0344 06/13/14 1116  NA 149* 148* 146*  K 3.5 3.4* 4.0  CL 118* 115* 115*    CO2 23 25 24   GLUCOSE 214* 204* 240*  BUN 38* 31* 30*  CREATININE 1.06 1.04 0.98  CALCIUM 8.1* 8.4 8.3*  MG 1.9  --   --   PHOS 2.8  --   --     Liver function tests: No results for input(s): AST, ALT, ALKPHOS, BILITOT, PROT, ALBUMIN in the last 72 hours. No results for input(s): LIPASE, AMYLASE in the last 72 hours.  CBC:  Recent Labs  06/12/14 0344 06/13/14 0350  WBC 21.9* 21.3*  HGB 10.1* 10.0*  HCT 30.0* 30.5*  MCV 90.6 92.7  PLT 180 232    Cardiac Enzymes: No results for input(s): CKTOTAL, CKMB, TROPONINI in the last 72 hours.  Coagulation Studies:  Recent Labs  06/11/14 1740  LABPROT 15.7*  INR 1.23    Other: Invalid input(s): POCBNP No results for input(s): DDIMER in the last 72 hours. No results for input(s): HGBA1C in the last 72 hours. No results for input(s): CHOL, HDL, LDLCALC, TRIG, CHOLHDL in the last 72 hours. No results for input(s): TSH, T4TOTAL, T3FREE, THYROIDAB in the last 72 hours.  Invalid input(s): FREET3 No results for input(s): VITAMINB12, FOLATE, FERRITIN, TIBC, IRON, RETICCTPCT in the last  72 hours.   Other results:  EKG :  Sinus tach, ant. MI.   Medications:    Infusions: . sodium chloride 20 mL/hr at 06/12/14 2348  . dexmedetomidine Stopped (06/11/14 1000)  . feeding supplement (VITAL AF 1.2 CAL) 1,000 mL (06/12/14 1339)  . heparin 1,150 Units/hr (06/12/14 1618)    Scheduled Medications: . antiseptic oral rinse  7 mL Mouth Rinse q12n4p  . arformoterol  15 mcg Nebulization BID  . budesonide (PULMICORT) nebulizer solution  0.5 mg Nebulization BID  . carvedilol  25 mg Oral BID WC  . chlorhexidine  15 mL Mouth Rinse BID  . clopidogrel  75 mg Oral Daily  . docusate  100 mg Per Tube Daily  . enalapril  10 mg Oral BID  . free water  200 mL Per Tube 3 times per day  . furosemide  40 mg Intravenous Q6H  . insulin aspart  2-6 Units Subcutaneous 6 times per day  . pantoprazole (PROTONIX) IV  40 mg Intravenous Q12H  .  sodium chloride  10-40 mL Intracatheter Q12H    Assessment/ Plan:      1. Troponin elevation.  I suspect this was due to demand ischemia.   He will need to improve significantly before we start any cardiology work up. Doubt this was an acute coronary event.  2. Acute systolic CHF:  Continue supportive care.  He may have had a "takotsubo " like event.  Will continue  Coreg 25 bid .   On  Vasotec   10 mg BID .   No new additional  recs.      Disposition:  Length of Stay: 9  Vesta Mixer, Montez Hageman., MD, [facility] 06/13/2014, 1:22 PM Office [phone] Pager [phone]

## 2014-06-13 NOTE — Clinical Social Work Psychosocial (Signed)
Clinical Social Work Department BRIEF PSYCHOSOCIAL ASSESSMENT 06/13/2014  Patient:  Burnett ShengMABE,Leobardo G     Account Number:  [id number]     Admit date:  06/04/2014  Clinical Social Worker:  Derenda FennelNIXON,Raneshia Derick, CLINICAL SOCIAL WORKER  Date/Time:  06/13/2014 10:27 AM  Referred by:  Physician  Date Referred:  06/13/2014 Referred for  SNF Placement   Other Referral:   Interview type:  Other - See comment Other interview type:   CSW spoke with pt's dtr, Bella KennedyKathy Doughtie via telephone.    PSYCHOSOCIAL DATA Living Status:  WIFE Admitted from facility:   Level of care:   Primary support name:  Bella KennedyKathy Yokum [phone] Primary support relationship to patient:  CHILD, ADULT Degree of support available:   Strong    CURRENT CONCERNS Current Concerns  Post-Acute Placement   Other Concerns:    SOCIAL WORK ASSESSMENT / PLAN Clinical Social Worker spoke with pt's dtr, Olegario MessierKathy in reference to post-acute placement for SNF. CSW explained CSW role and SNF process. Pt's dtr stated "I don't think dad is going to like this". Pt's dtr reported pt wishes to return home with his wife however he is aware he will need assistance at home. Pt's dtr further stated she related to pt that she would move in with him if she needed to. CSW explained pt will need 24 hour assistance and rehab. Pt's dtr stated she is agreeable to SNF search however [facility]. CSW will continue to follow pt and pt's family for continued support and to facilitate pt's discharge needs once medically stable.   Assessment/plan status:  Psychosocial Support/Ongoing Assessment of Needs Other assessment/ plan:   FL-2   Information/referral to community resources:   SNF information provided.    PATIENT'S/FAMILY'S RESPONSE TO PLAN OF CARE: Pt lying in bed oriented to person only. Pt's dtr a little reluctant in regards to SNF placement however agreeable to SNF search. Pt's dtr pleasant and appreciated social work  intervention.       Derenda FennelBashira Jilberto Vanderwall, MSW, LCSWA [phone] 06/13/2014 10:45 AM

## 2014-06-13 NOTE — Clinical Social Work Placement (Signed)
Clinical Social Work Department CLINICAL SOCIAL WORK PLACEMENT NOTE 06/13/2014  Patient:  Marvin Leon,Marvin Leon  Account Number:  [id number] Admit date:  06/04/2014  Clinical Social Worker:  Derenda FennelBASHIRA Lolamae Voisin, CLINICAL SOCIAL WORKER  Date/time:  06/13/2014 10:46 AM  Clinical Social Work is seeking post-discharge placement for this patient at the following level of care:   SKILLED NURSING   (*CSW will update this form in Epic as items are completed)   06/13/2014  Patient/family provided with [facility] Department of Clinical Social Work's list of facilities offering this level of care within the geographic area requested by the patient (or if unable, by the patient's family).  06/13/2014  Patient/family informed of their freedom to choose among providers that offer the needed level of care, that participate in Medicare, Medicaid or managed care program needed by the patient, have an available bed and are willing to accept the patient.  06/13/2014  Patient/family informed of [facility]' ownership interest in [facility], as well as of the fact that they are under no obligation to receive care at this facility.  PASARR submitted to EDS on 06/13/2014 PASARR number received on 06/13/2014  FL2 transmitted to all facilities in geographic area requested by pt/family on  06/13/2014 FL2 transmitted to all facilities within larger geographic area on 06/13/2014  Patient informed that his/her managed care company has contracts with or will negotiate with  certain facilities, including the following:   YES     Patient/family informed of bed offers received:   Patient chooses bed at  Physician recommends and patient chooses bed at    Patient to be transferred to  on   Patient to be transferred to facility by  Patient and family notified of transfer on  Name of family member notified:    The following physician request were entered in Epic:   Additional Comments:   Derenda FennelBashira Pattie Flaharty, MSW,  LCSWA [phone] 06/13/2014 10:47 AM

## 2014-06-13 NOTE — Progress Notes (Signed)
eLink Physician-Brief Progress Note Patient Name: Marvin ShengDonald G Rho DOB: 1932/09/02 MRN: [id number]   Date of Service  06/13/2014  HPI/Events of Note  Notified by Leotis ShamesLauren, RN that she was able to expand on discussions regarding goals of care for Pt with his wife. His wife is clear that she would not want CPR or ACLS, but would want MV in the event of recurrent respiratory failure. Based on this his is wife is comfortable with limited code orders.   eICU Interventions       Intervention Category Major Interventions: End of life / care limitation discussion  Forrestine Lecrone S. 06/13/2014, 7:15 PM

## 2014-06-13 NOTE — Progress Notes (Signed)
PULMONARY / CRITICAL CARE MEDICINE   Name: NAOD SWEETLAND MRN: [id number] DOB: Mar 24, 1933    ADMISSION DATE:  06/04/2014 CONSULTATION DATE:  06/04/2014  REFERRING MD :  EDP  CHIEF COMPLAINT:  VDRF and upper GI bleed  INITIAL PRESENTATION: 81 year old male hx of arthritis, GERD, HLD, HTN, CAD s/p 3 stents, emphysema, PAD , DMII on daily ASA and plavix since 02/22/14 since DES  iliac stent placement 02/2014, but no NSAID use who presents to the hospital with hematemesis and AMS.  In the ED the patient was noted to not be protecting his airway and was intubated for airway protection.  OGT was placed with 1500 ml of coffee ground black material was noted.  PCCM was called to admit to the ICU.  No recent etoh history. Normal cscope and endoscope 2009.  STUDIES:  1/18  NGT lavaged with only coffee grounds, no active bleeding. 1/18  CT - mild atrophy.  1/18  KUB - normal. 1/19  EGD. LA class D esophagitis. this is the source of the bleeding. 1/20 Echo- EF 25%. Diffuse hypokinesis. No classic Takotsubo's, but function worse towards apex. No thrombi.  1/22 MRI Brain- small 5mm acute infarct in left occipital lobe- likely not cause for mental status changes 1/22 EEG- slow with triphasic waves, consistent with patient's condition. No seizures. 1/26 CT Head, no acute infarct  SIGNIFICANT EVENTS: 1/18  UGI bleed. hgb dropped. 1/19  EGD, weaned off pressor.  1/20  Failed extubation. Off pressors 1/22  Agitation overnight, on precedex / fentanyl, low grade fevers 1/25 Hbg 7.7, transfused 1 unit PRBCs 1/25 LUE Doppler +DVT, started on heparin gtt 1/26 Acute anisocoria noted, CT head neg. Ophtho eval- Adies pupil vs. physiologic   SUBJECTIVE:  O2 sat dropped to 82% this AM. Patient having distress and wheezing on exam, placed on BiPAP. Breathing much improved, minimal wheezes.   VITAL SIGNS: Temp:  [97.8 F (36.6 C)-98.8 F (37.1 C)] 97.8 F (36.6 C) (01/27 0429) Pulse Rate:  [38-110] 99 (01/27  0700) Resp:  [21-39] 36 (01/27 0700) BP: (122-170)/(53-93) 155/79 mmHg (01/27 0745) SpO2:  [88 %-100 %] 99 % (01/27 0700) FiO2 (%):  [40 %] 40 % (01/27 0400) Weight:  [76.9 kg (169 lb 8.5 oz)] 76.9 kg (169 lb 8.5 oz) (01/27 0500)   HEMODYNAMICS:     VENTILATOR SETTINGS: Vent Mode:  [-] BIPAP FiO2 (%):  [40 %] 40 % PEEP:  [6 cmH20] 6 cmH20 Pressure Support:  [6 cmH20] 6 cmH20   INTAKE / OUTPUT:  Intake/Output Summary (Last 24 hours) at 06/13/14 1191 Last data filed at 06/13/14 0700  Gross per 24 hour  Intake 2718.8 ml  Output    980 ml  Net 1738.8 ml    PHYSICAL EXAMINATION: General: alert, awake, NAD Neuro: alert and oriented x 3. Pupils equal (3mm), round, reactive to light. Follows commands HEENT:  [place]/AT, EOMI Cardiovascular: RRR, no m/g/r Lungs: minimal end expiratory wheezes noted, breaths non-labored on BiPAP Abdomen: BS+, soft, non-distended, non-tender Musculoskeletal: no acute deformities Skin:  Warm, dry, no edema   LABS:  CBC  Recent Labs Lab 06/11/14 1647 06/12/14 0344 06/13/14 0350  WBC 20.5* 21.9* 21.3*  HGB 10.1* 10.1* 10.0*  HCT 29.5* 30.0* 30.5*  PLT 142* 180 232   Coag's  Recent Labs Lab 06/11/14 1740  APTT 30  INR 1.23   BMET  Recent Labs Lab 06/10/14 0400 06/11/14 0420 06/12/14 0344  NA 148* 149* 148*  K 3.4* 3.5 3.4*  CL 118* 118* 115*  CO2 BUN 37* 38* 31*  CREATININE 1.18 1.06 1.04  GLUCOSE 241* 214* 204*   Electrolytes  Recent Labs Lab 06/07/14 0450  06/10/14 0400 06/11/14 0420 06/12/14 0344  CALCIUM 8.7  < > 8.2* 8.1* 8.4  MG 1.6  --  1.7 1.9  --   PHOS 2.7  --  1.7* 2.8  --   < > = values in this interval not displayed.   Sepsis Markers  Recent Labs Lab 06/08/14 0800 06/09/14 0430 06/10/14 0400  PROCALCITON 2.70 2.08 1.24     ABG  Recent Labs Lab 06/12/14 0640  PHART 7.498*  PCO2ART 26.6*  PO2ART 35.0*   Liver Enzymes  No results for input(s): AST, ALT, ALKPHOS, BILITOT, ALBUMIN  in the last 168 hours. Cardiac Enzymes  Recent Labs Lab 06/06/14 1140 06/07/14 0008 06/07/14 0450  TROPONINI 1.51* 1.14* 1.00*   Glucose  Recent Labs Lab 06/12/14 0749 06/12/14 1241 06/12/14 1754 06/12/14 2004 06/12/14 2353 06/13/14 0427  GLUCAP 182* 193* 136* 132* 145* 200*    Imaging Ct Portable Head W/o Cm  06/12/2014   CLINICAL DATA:  Asymmetric pupils, right larger than left  EXAM: CT HEAD WITHOUT CONTRAST  TECHNIQUE: Contiguous axial images were obtained from the base of the skull through the vertex without intravenous contrast.  COMPARISON:  Head CT June 04, 2014 and brain MRI June 08, 2014  FINDINGS: Mild diffuse atrophy is stable. There is no intracranial mass, hemorrhage, extra-axial fluid collection, or midline shift. There is mild patchy small vessel disease in the centra semiovale bilaterally. Elsewhere, gray-white compartments appear normal. No acute infarct is appreciable on this study. The bony calvarium appears intact. The mastoid air cells are clear.  IMPRESSION: Mild diffuse atrophy with patchy periventricular small vessel disease. No intracranial mass, hemorrhage, extra-axial fluid collection, or acute appearing infarct.   Electronically Signed   By: Bretta Bang M.D.   On: 06/12/2014 19:13     ASSESSMENT / PLAN:  PULMONARY OETT 1/18>>>1/25 A:  VDRF - due to AMS, hematemesis- Resolved. Extubated 1/25.  Aspiration pneumonia- Resolving  ?COPD- heavy smoking history P:   Continue BiPAP, transition to nasal cannula.  Continue Brovana, Pulmicort, Duonebs  CARDIOVASCULAR L IJ TLC 1/18 >>1/24 PICC 1/24>>  A:  Sinus Tachycardia -  with occasional wide complex tachy - likely 2/2 to blood loss, sepsis, shock and NSTEMI with peak Tp 1.5- resolving  Septic shock - improved, off pressors CAD Troponin elevation - likely 2/2 to demand/shock > improved Severe systolic CHF EF 25% on ECHO 06/06/14. ?Takotsubo appearance DVT in LUE P:  Continue heparin  gtt Remove PICC line due to DVT when able, ensure peripheral access Continue plavix  Continue coreg 12.5 mg BID, vasotec 2.5 mg BID per cardiology Consider repeat ECHO when patient improves from current episode. May need to have ICD if EF remains this low. Continue Metoprolol 2.5 mg IV PRN HR >125  RENAL A:   Acute on Chronic Renal Failure - baseline sr cr ~ 1.4,  Acute rise with acute blood loss vs septic shock. - Resolved Hypomagnesemia- improving  Hypokalemia  Hypophosphatemia - improving  Hypernatremia P:   Trend BMP Replace electrolytes as indicated  Free water added for hyperNa   GASTROINTESTINAL A:   LA class D esophagitis - identified source of the bleeding per EGD report.  P:   Protonix BID, needs daily indefinitely  Continue plavix, monitor closely for new bleeding  Continue TFs  HEMATOLOGIC A:   Acute on Chronic Anemia - baseline hgb ~ 12-13, likely from esophagitis seen on EGD. No more drop, stable now. Chronic Thrombocytopenia    DVT in LUE P:  Continue heparin gtt CBC daily  Transfuse goal hgb <8 since has Cardiac disease. DVT ppx:  Heparin gtt, SCD's   INFECTIOUS A:  Fever - Improving. Unknown etiology- possibly central line, taken out 1/24. Afebrile o/ 48 hrs. However, WBC count trending up- likely due to DVT Aspiration pneumonia - improving per CXR P:   BCx2 1/18 >> negative UC 1/18 >> negative Sputum 1/18 >> negative  CSF 1/21 >> negative L IJ tip culture 1/24>> negative   Blood cx 1/27 >>  Cefepime 1/18 >>1/20 Flagyl 1/18 >> 1/23 Ceftriaxone 1/20 >> 1/24   1/27 Vanc >. 1/27 zosyn >   PCT down to 1 from 27 Afebrile, but WBC up likely due to DVT. Continue to monitor.  Check procalcitonin  ENDOCRINE A:   DM.   AI ruled out P:   SSI    NEUROLOGIC A:   Acute encephalopathy - in setting of shock, blood loss. Continues to improve.  Agitation - resolved.  Likely opiate dependance since he is on large dose at home. Anisocoria-  noted on exam yesterday, now pupils symmetric. Eval by ophtho- Adies pupil vs. physiologic P:   Ativan PRN since was on benzo at home. Would restart home xanax when takes PO.  Mobilize / PT as able  Outpatient ophthalmology appointment    FAMILY  - Updates: no family available at bedside 1/23.      Rich Numberarly Rivet, MD Internal Medicine Resident, PGY-1   Attending:  I have seen and examined the patient with nurse practitioner/resident and agree with the note above.   He is back on BIPAP this morning, CXR with increased infiltrates, lung exam unchanged but less oriented today  I'm worried he may have developed pneumonia given leukocytosis, but hopefully this is just pulmonary edema  Check procalcitonin Add diuresis and antibiotics for HCAP  Discussed with daughters Lura Ematsy and Olegario MessierKathy.  He is very weak and at baseline is basically home bound requiring a home health aid.  He has recovered remarkably well from the GI bleed and cardiomyopathy, but unfortunately he is quite weak and high risk for re-intubation.  I have advised DNR/DNI status given his poor overall prognosis.  They are considering it and will get back with us.  For now remains Full code.  Heber CarolinaBrent McQuaid, MD [place] [facility] Pager: [phone] Cell: [phone] If no response, call [phone]

## 2014-06-13 NOTE — Progress Notes (Signed)
ANTICOAGULATION CONSULT NOTE - FOLLOW-UP  Pharmacy Consult:  Heparin + Vancomycin / Zosyn Indication: DVT, LUE + HCAP  Allergies  Allergen Reactions  . Darvocet [Propoxyphene N-Acetaminophen] Nausea And Vomiting  . Penicillins Nausea And Vomiting    Patient Measurements: Weight: 169 lb 8.5 oz (76.9 kg)  Vital Signs: Temp: 98.6 F (37 C) (01/27 0856) Temp Source: Oral (01/27 0856) BP: 155/78 mmHg (01/27 0818) Pulse Rate: 94 (01/27 0818)  Labs:  Recent Labs  06/11/14 0420 06/11/14 1647 06/11/14 1740 06/12/14 0344 06/12/14 1200 06/13/14 0350  HGB 7.7* 10.1*  --  10.1*  --  10.0*  HCT 22.8* 29.5*  --  30.0*  --  30.5*  PLT 134* 142*  --  180  --  232  APTT  --   --  30  --   --   --   LABPROT  --   --  15.7*  --   --   --   INR  --   --  1.23  --   --   --   HEPARINUNFRC  --   --   --  0.76* 0.68 0.61  CREATININE 1.06  --   --  1.04  --   --     Estimated Creatinine Clearance: 57.6 mL/min (by C-G formula based on Cr of 1.04).   Assessment: 89 YOM presented on 1/18 with VDRF possibly due to shock/NSTEMI, found to have an upper GIB, & was tachycardiac. 1/25 dopplers show DVT of the left brachial and axillary veins. Pharmacy consulted to dose heparin. The heparin level today is therapeutic at 0.61 IU/mL. H/H continues to be low but increased from presentation & PLT WNL (increased from 180 on 1/26 to 232 on 1/27). No signs or symptoms of bleeding noted.   Patient was previously on antibiotics of aspiration PNA.  Now to resume and broaden therapy to vancomycin and Zosyn for HCAP.  His renal function continues to improve and appears to be at baseline.   Goal of Therapy:  Heparin level 0.3-0.7 units/ml Monitor platelets by anticoagulation protocol: Yes  Vanc trough: 15-20 mcg/mL    Plan:  - Decrease heparin gtt slightly 1100 units/hr - Daily CBC and heparin level - Monitor H/H, PLT, and signs and symptoms of bleeding - Vanc 1250mg  IV x 1, then 1gm IV Q12H - Zosyn  3.375gm IV Q8H, 4 hr infusion - Monitor renal fxn, clinical progress, vanc trough as indicated  Glendell DockerCooke, Tommi J 06/13/2014,9:45 AM   Sheila Gervasi D. Laney Potashang, PharmD, BCPS Pager:  [phone] 06/13/2014, 2:27 PM

## 2014-06-13 NOTE — Progress Notes (Signed)
Attempted patient off of BiPAP at this time patient was on 5L [place]. Had to place patient back on BiPAP due to decreased SpO2, increased RR/WOB.

## 2014-06-14 DIAGNOSIS — I5021 Acute systolic (congestive) heart failure: Secondary | ICD-10-CM | POA: Diagnosis present

## 2014-06-14 DIAGNOSIS — R5381 Other malaise: Secondary | ICD-10-CM | POA: Diagnosis present

## 2014-06-14 LAB — GLUCOSE, CAPILLARY
GLUCOSE-CAPILLARY: 137 mg/dL — AB (ref 70–99)
GLUCOSE-CAPILLARY: 149 mg/dL — AB (ref 70–99)
Glucose-Capillary: 135 mg/dL — ABNORMAL HIGH (ref 70–99)
Glucose-Capillary: 112 mg/dL — ABNORMAL HIGH (ref 70–99)
Glucose-Capillary: 148 mg/dL — ABNORMAL HIGH (ref 70–99)
Glucose-Capillary: 105 mg/dL — ABNORMAL HIGH (ref 70–99)

## 2014-06-14 LAB — CBC
HCT: 27.9 % — ABNORMAL LOW (ref 39.0–52.0)
HCT: 27.8 % — ABNORMAL LOW (ref 39.0–52.0)
HEMOGLOBIN: 9 g/dL — AB (ref 13.0–17.0)
Hemoglobin: 9.4 g/dL — ABNORMAL LOW (ref 13.0–17.0)
MCH: 29.3 pg (ref 26.0–34.0)
MCH: 30.9 pg (ref 26.0–34.0)
MCHC: 32.3 g/dL (ref 30.0–36.0)
MCHC: 33.8 g/dL (ref 30.0–36.0)
MCV: 90.9 fL (ref 78.0–100.0)
MCV: 91.4 fL (ref 78.0–100.0)
PLATELETS: 81 10*3/uL — AB (ref 150–400)
PLATELETS: 260 10*3/uL (ref 150–400)
RBC: 3.07 MIL/uL — ABNORMAL LOW (ref 4.22–5.81)
RBC: 3.04 MIL/uL — ABNORMAL LOW (ref 4.22–5.81)
RDW: 13.2 % (ref 11.5–15.5)
RDW: 15.6 % — AB (ref 11.5–15.5)
WBC: 6 10*3/uL (ref 4.0–10.5)
WBC: 14.8 10*3/uL — AB (ref 4.0–10.5)

## 2014-06-14 LAB — URINE CULTURE
COLONY COUNT: NO GROWTH
Culture: NO GROWTH
Special Requests: NORMAL

## 2014-06-14 LAB — BASIC METABOLIC PANEL
Anion gap: 4 — ABNORMAL LOW (ref 5–15)
Anion gap: 11 (ref 5–15)
BUN: 13 mg/dL (ref 6–23)
BUN: 27 mg/dL — ABNORMAL HIGH (ref 6–23)
CALCIUM: 8 mg/dL — AB (ref 8.4–10.5)
CO2: 22 mmol/L (ref 19–32)
CO2: 28 mmol/L (ref 19–32)
Calcium: 8.4 mg/dL (ref 8.4–10.5)
Chloride: 112 mmol/L (ref 96–112)
Chloride: 106 mmol/L (ref 96–112)
Creatinine, Ser: 0.59 mg/dL (ref 0.50–1.35)
Creatinine, Ser: 1.08 mg/dL (ref 0.50–1.35)
GFR calc Af Amer: 73 mL/min — ABNORMAL LOW (ref >90)
GFR calc non Af Amer: 63 mL/min — ABNORMAL LOW (ref >90)
Glucose, Bld: 97 mg/dL (ref 70–99)
Glucose, Bld: 162 mg/dL — ABNORMAL HIGH (ref 70–99)
Potassium: 3.6 mmol/L (ref 3.5–5.1)
Potassium: 3.3 mmol/L — ABNORMAL LOW (ref 3.5–5.1)
Sodium: 138 mmol/L (ref 135–145)
Sodium: 145 mmol/L (ref 135–145)

## 2014-06-14 LAB — PROCALCITONIN: Procalcitonin: 25.09 ng/mL

## 2014-06-14 LAB — HEPARIN LEVEL (UNFRACTIONATED)
HEPARIN UNFRACTIONATED: 0.21 [IU]/mL — AB (ref 0.30–0.70)
HEPARIN UNFRACTIONATED: 0.37 [IU]/mL (ref 0.30–0.70)

## 2014-06-14 MED ORDER — STARCH (THICKENING) PO POWD: ORAL | Status: DC | PRN

## 2014-06-14 MED ORDER — POTASSIUM CHLORIDE 20 MEQ/15ML (10%) PO SOLN
20.0000 meq | ORAL | Status: AC
  Administered 2014-06-14: 20 meq
  Filled 2014-06-14: qty 15

## 2014-06-14 MED ORDER — RESOURCE THICKENUP CLEAR PO POWD
ORAL | Status: DC | PRN
  Filled 2014-06-14: qty 125

## 2014-06-14 MED ORDER — POTASSIUM CHLORIDE 10 MEQ/100ML IV SOLN
10.0000 meq | INTRAVENOUS | Status: AC
  Administered 2014-06-14 (×4): 10 meq via INTRAVENOUS
  Filled 2014-06-14 (×4): qty 100

## 2014-06-14 NOTE — Progress Notes (Signed)
OT performed swallow eval and wanted to do barium swallow test. I spoke to Dr. Kendrick FriesMcQuaid about changing PO meds, he stated I should go ahead and give PO meds. Will give meds by mouth and monitor closely.

## 2014-06-14 NOTE — Consult Note (Signed)
Physical Medicine and Rehabilitation Consult   Reason for Consult: Encephalopathy due to sepsis, GIB, aspiration PNA, as well as balance deficits due to cerebellar stroke. Referring Physician: Dr. Kendrick Fries.    HPI: Marvin Leon is a 81 y.o. male hx of DDD, DM type 2 with peripheral neuropathy and chronic dizziness, GERD, CAD s/p 3 stents, emphysema, PAD , on daily ASA and plavix since 02/22/14 since DES iliac stent placement 02/2014, but no NSAID use who presented to the hospital with hematemesis and AMS. In the ED the patient was noted to not be protecting his airway and was intubated for airway protection. He was started on IV  antibiotics for septic shock and aspiration PNA.  OGT was placed with 1500 ml of coffee ground black material was noted. EGD by Dr. Elnoria Howard revealed esophagitis and PPI recommended for treatment. Cardiology consulted for input on tachycardia as well as positive cardiac enzymes.  Elevated enzymes felt to be due to demand ischemia but patient not a cath candidate due tot GIB. Cardiac echo done revealing diffuse hypokinesis with EF 25% not classic Takotsubo appearance but worse towards the apex.  He continued to be encephalopathic with agitation therefore neurology was consulted for input.  MRI brain done revealing small area of acute infarct in left occipital lobe but not felt to be cause of MS changes.  EEG with triphasic waves consistent with patient's sepsis with fever and high white count.  To continue plavix and no further neurologic work up indicated.   Acute on chronic CHF treated with diuresis. He continued to have fevers and was found to have  LUE DVT involving left axillary and brachial vein. He was started on IV heparin and central line d/c 01/24. Patient's mentation has improved and he was slow to wean off vent to BIPAP. Dr. Vonna Kotyk was consulted due to note of acute anisocoria on 01/26. CT  Head negative .Swallow evaluation done today and NPO recommended due to  weak cough and concerns of decreased airway protection. Patient reported to have increase in confusion over past few days question due to opiate and/or benzo withdrawal. He has had increase in WBC as well as procalcitonin with new infiltrates on CXR therefore started on Vancomycin on 01/27.  Blood cultures pending. Therapy ongoing and patient with balance deficits with cognitive impairments.  CIR recommended by MD and rehab team.   Patient is able to answer simple questions  Review of Systems  Eyes: Negative for blurred vision.  Respiratory: Negative for shortness of breath.   Cardiovascular: Negative for chest pain and palpitations.  Gastrointestinal: Negative for heartburn and abdominal pain.  Musculoskeletal: Negative for myalgias, back pain, joint pain and neck pain.  Neurological: Positive for dizziness.      Past Medical History  Diagnosis Date  . Arthritis   . GERD (gastroesophageal reflux disease)   . Hyperlipidemia   . Hypertension   . Coronary artery disease   . Carotid artery disease   . Peripheral arterial disease     50% ostial right common iliac artery stenosis without claudication  . Emphysema   . Shortness of breath   . Diabetes mellitus without complication     type 2  . Anxiety     Past Surgical History  Procedure Laterality Date  . Colonoscopy    . Cardiac catheterization      3 stents placed  . Cholecystectomy N/A 04/07/2013    Procedure: LAPAROSCOPIC CHOLECYSTECTOMY WITH INTRAOPERATIVE CHOLANGIOGRAM;  Surgeon: Ollen Gross  Vernell Morgansoth III, MD;  Location: [facility] OR;  Service: General;  Laterality: N/A;  . Iliac artery stent Right 02/22/2014    Dr. Allyson SabalBerry 7 mm x 22 mm  ICast covered stent  . Coronary angioplasty      all 3 heart vessels   . Esophagogastroduodenoscopy Left 06/05/2014    Procedure: ESOPHAGOGASTRODUODENOSCOPY (EGD);  Surgeon: Theda BelfastPatrick D Hung, MD;  [facility];  Service: Endoscopy;  Laterality: Left;    Family History  Problem Relation Age of  Onset  . Heart disease Mother   . Heart disease Father     Social History: Married. Wife has an aide 8 hrs/day. Sedentary with limited mobility PTA.   reports that he quit smoking about 37 years ago. He has never used smokeless tobacco. He reports that he does not drink alcohol or use illicit drugs.    Allergies  Allergen Reactions  . Darvocet [Propoxyphene N-Acetaminophen] Nausea And Vomiting  . Penicillins Nausea And Vomiting    Medications Prior to Admission  Medication Sig Dispense Refill  . ALPRAZolam (XANAX) 1 MG tablet Take 1 mg by mouth 2 (two) times daily.      Marland Kitchen. aspirin EC 81 MG tablet Take 81 mg by mouth every morning.      . cetirizine (ZYRTEC) 10 MG tablet Take 5 mg by mouth daily.    . cholecalciferol (VITAMIN D) 400 UNITS TABS tablet Take 400 Units by mouth 2 (two) times daily.     . clopidogrel (PLAVIX) 75 MG tablet Take 1 tablet (75 mg total) by mouth daily. 30 tablet 11  . dexlansoprazole (DEXILANT) 60 MG capsule Take 60 mg by mouth daily before breakfast.     . docusate sodium (COLACE) 100 MG capsule Take 200 mg by mouth daily.     Marland Kitchen. ezetimibe-simvastatin (VYTORIN) 10-40 MG per tablet Take 1 tablet by mouth 3 (three) times a week. Mondays, Thursdays and Saturdays    . Ferrous Sulfate (IRON) 28 MG TABS Take 28 mg by mouth once a week. Wednesdays    . isosorbide mononitrate (IMDUR) 30 MG 24 hr tablet Take 15 mg by mouth at bedtime.      . metFORMIN (GLUCOPHAGE) 1000 MG tablet Take 1 tablet (1,000 mg total) by mouth 2 (two) times daily with a meal. HOLD for 2 days, restart on 02/25/2014    . metoCLOPramide (REGLAN) 10 MG tablet Take 5 mg by mouth daily before breakfast.    . metoprolol (TOPROL-XL) 50 MG 24 hr tablet Take 25 mg by mouth every morning.     Marland Kitchen. morphine (MSIR) 15 MG tablet Take 15 mg by mouth 3 (three) times daily as needed for severe pain.    . Multiple Vitamins-Minerals (MULTIVITAMINS THER. W/MINERALS) TABS Take 1 tablet by mouth daily.      . niacin  (NIASPAN) 750 MG CR tablet Take 750-1,500 mg by mouth at bedtime. Alternates every 2 days - 1500mg  x 2 days, 750mg  x 2 days, 1500mg  x 2 days.....    Marland Kitchen. PARoxetine (PAXIL) 20 MG tablet Take 20 mg by mouth every evening.      . pregabalin (LYRICA) 75 MG capsule Take 1 capsule (75 mg total) by mouth 2 (two) times daily. 180 capsule 3  . Probiotic Product (ULTRAFLORA IMMUNE HEALTH PO) Take 1 tablet by mouth daily before breakfast.    . tamsulosin (FLOMAX) 0.4 MG CAPS Take 0.4 mg by mouth daily after supper.       Home: Home Living Family/patient expects to be discharged  to:: Private residence Living Arrangements: Spouse/significant other Available Help at Discharge: Family, Available PRN/intermittently Type of Home: House Home Access: Stairs to enter [employer] of Steps: 1 Home Layout: One level Home Equipment: Environmental consultant - 2 wheels, [place] - single point, Grab bars - tub/shower, Wheelchair - manual  Functional History: Prior Function Level of Independence: Independent Comments: pt states he was caring for himself but at other times states he and wife use WC. Unclear true PLOF and family not present to confirm Functional Status:  Mobility: Bed Mobility Overal bed mobility: Needs Assistance Bed Mobility: Supine to Sit Supine to sit: Mod assist, HOB elevated General bed mobility comments: Verbal cues for technique and assist to elevate trunk. Transfers Overall transfer level: Needs assistance Equipment used: Rolling walker (2 wheeled) Transfers: Sit to/from Stand Sit to Stand: Mod assist General transfer comment: Pt with posterior lean. Cues for hand placement. Ambulation/Gait Ambulation/Gait assistance: Min assist, Mod assist, +2 safety/equipment Ambulation Distance (Feet): 25 Feet (x 2) Assistive device: Rolling walker (2 wheeled) Gait Pattern/deviations: Step-through pattern, Decreased step length - right, Decreased step length - left, Shuffle, Leaning posteriorly, Trunk  flexed Gait velocity interpretation: Below normal speed for age/gender General Gait Details: Followed with chair for safety. Pt with posterior lean which incr as distance incr resulting in more assistance needed. Pt veering left and assist to steer walker.    ADL:    Cognition: Cognition Overall Cognitive Status: Impaired/Different from baseline Orientation Level: Oriented to person Cognition Arousal/Alertness: Awake/alert Behavior During Therapy: Flat affect Overall Cognitive Status: Impaired/Different from baseline Area of Impairment: Problem solving Memory: Decreased short-term memory Following Commands: Follows one step commands consistently, Follows one step commands with increased time Safety/Judgement: Decreased awareness of safety, Decreased awareness of deficits Problem Solving: Requires verbal cues, Requires tactile cues  Blood pressure 129/63, pulse 80, temperature 98.2 F (36.8 C), temperature source Oral, resp. rate 31, weight 73 kg (160 lb 15 oz), SpO2 100 %. Physical Exam  Nursing note and vitals reviewed. Constitutional: He appears well-developed. He has a sickly appearance. Nasal cannula in place.  Restless and disoriented.   HENT:  Head: Normocephalic and atraumatic.  Eyes: Conjunctivae and EOM are normal.  Neck: Normal range of motion. Neck supple.  Cardiovascular: Normal rate and regular rhythm.   Respiratory: Effort normal. No respiratory distress. He has decreased breath sounds. He has no wheezes.  GI: Soft. Bowel sounds are normal. He exhibits no distension. There is no tenderness.  Musculoskeletal: He exhibits edema (right hand).  Neurological: He is alert.  Oriented to self and place. Confused and disoriented focused on "going home tonight". Has poor insight and lacks awareness. Able to follow simple one step commands. Moves all four.   Skin: Skin is warm and dry.  Motor strength is 3 minus left deltoid secondary to decreased range of motion for right  deltoid for bilateral biceps triceps and grip 4 minus bilateral hip flexors knee extensors ankle dorsiflexor and plantar flexor Sensory difficult to assess secondary to poor attention and concentration.  Results for orders placed or performed during the hospital encounter of 06/04/14 (from the past 24 hour(s))  Culture, blood (routine x 2)     Status: None (Preliminary result)   Collection Time: 06/13/14  3:20 PM  Result Value Ref Range   Specimen Description BLOOD RIGHT ARM    Special Requests BOTTLES DRAWN AEROBIC ONLY 1 CC    Culture             BLOOD CULTURE RECEIVED NO  GROWTH TO DATE CULTURE WILL BE HELD FOR 5 DAYS BEFORE ISSUING A FINAL NEGATIVE REPORT Performed at [facility]    Report Status PENDING   Procalcitonin - Baseline     Status: None   Collection Time: 06/13/14  3:25 PM  Result Value Ref Range   Procalcitonin 0.29 ng/mL  Culture, blood (routine x 2)     Status: None (Preliminary result)   Collection Time: 06/13/14  3:43 PM  Result Value Ref Range   Specimen Description BLOOD RIGHT FOREARM    Special Requests BOTTLES DRAWN AEROBIC ONLY 1.5 CC    Culture             BLOOD CULTURE RECEIVED NO GROWTH TO DATE CULTURE WILL BE HELD FOR 5 DAYS BEFORE ISSUING A FINAL NEGATIVE REPORT Performed at [facility]    Report Status PENDING   Glucose, capillary     Status: Abnormal   Collection Time: 06/13/14  3:52 PM  Result Value Ref Range   Glucose-Capillary 156 (H) 70 - 99 mg/dL  Glucose, capillary     Status: Abnormal   Collection Time: 06/13/14  7:33 PM  Result Value Ref Range   Glucose-Capillary 163 (H) 70 - 99 mg/dL  Urinalysis, Routine w reflex microscopic     Status: None   Collection Time: 06/13/14  7:52 PM  Result Value Ref Range   Color, Urine YELLOW YELLOW   APPearance CLEAR CLEAR   Specific Gravity, Urine 1.009 1.005 - 1.030   pH 5.5 5.0 - 8.0   Glucose, UA NEGATIVE NEGATIVE mg/dL   Hgb urine dipstick NEGATIVE NEGATIVE   Bilirubin Urine  NEGATIVE NEGATIVE   Ketones, ur NEGATIVE NEGATIVE mg/dL   Protein, ur NEGATIVE NEGATIVE mg/dL   Urobilinogen, UA 1.0 0.0 - 1.0 mg/dL   Nitrite NEGATIVE NEGATIVE   Leukocytes, UA NEGATIVE NEGATIVE  Glucose, capillary     Status: Abnormal   Collection Time: 06/14/14 12:23 AM  Result Value Ref Range   Glucose-Capillary 137 (H) 70 - 99 mg/dL   Comment 1 Documented in Chart    Comment 2 Notify RN   Glucose, capillary     Status: Abnormal   Collection Time: 06/14/14  3:58 AM  Result Value Ref Range   Glucose-Capillary 149 (H) 70 - 99 mg/dL  CBC     Status: Abnormal   Collection Time: 06/14/14  5:00 AM  Result Value Ref Range   WBC 6.0 4.0 - 10.5 K/uL   RBC 3.07 (L) 4.22 - 5.81 MIL/uL   Hemoglobin 9.0 (L) 13.0 - 17.0 g/dL   HCT 81.1 (L) 91.4 - 78.2 %   MCV 90.9 78.0 - 100.0 fL   MCH 29.3 26.0 - 34.0 pg   MCHC 32.3 30.0 - 36.0 g/dL   RDW 95.6 21.3 - 08.6 %   Platelets 81 (L) 150 - 400 K/uL  Heparin level (unfractionated)     Status: Abnormal   Collection Time: 06/14/14  5:00 AM  Result Value Ref Range   Heparin Unfractionated 0.21 (L) 0.30 - 0.70 IU/mL  Procalcitonin     Status: None   Collection Time: 06/14/14  5:00 AM  Result Value Ref Range   Procalcitonin 25.09 ng/mL  Basic metabolic panel     Status: Abnormal   Collection Time: 06/14/14  5:00 AM  Result Value Ref Range   Sodium 138 135 - 145 mmol/L   Potassium 3.6 3.5 - 5.1 mmol/L   Chloride 112 96 - 112 mmol/L   CO2  22 19 - 32 mmol/L   Glucose, Bld 97 70 - 99 mg/dL   BUN 13 6 - 23 mg/dL   Creatinine, Ser 3.24 0.50 - 1.35 mg/dL   Calcium 8.0 (L) 8.4 - 10.5 mg/dL   GFR calc non Af Amer >90 >90 mL/min   GFR calc Af Amer >90 >90 mL/min   Anion gap 4 (L) 5 - 15  Glucose, capillary     Status: Abnormal   Collection Time: 06/14/14  7:43 AM  Result Value Ref Range   Glucose-Capillary 135 (H) 70 - 99 mg/dL  CBC     Status: Abnormal   Collection Time: 06/14/14 11:00 AM  Result Value Ref Range   WBC 14.8 (H) 4.0 - 10.5  K/uL   RBC 3.04 (L) 4.22 - 5.81 MIL/uL   Hemoglobin 9.4 (L) 13.0 - 17.0 g/dL   HCT 40.1 (L) 02.7 - 25.3 %   MCV 91.4 78.0 - 100.0 fL   MCH 30.9 26.0 - 34.0 pg   MCHC 33.8 30.0 - 36.0 g/dL   RDW 66.4 (H) 40.3 - 47.4 %   Platelets 260 150 - 400 K/uL  Basic metabolic panel     Status: Abnormal   Collection Time: 06/14/14 11:00 AM  Result Value Ref Range   Sodium 145 135 - 145 mmol/L   Potassium 3.3 (L) 3.5 - 5.1 mmol/L   Chloride 106 96 - 112 mmol/L   CO2 28 19 - 32 mmol/L   Glucose, Bld 162 (H) 70 - 99 mg/dL   BUN 27 (H) 6 - 23 mg/dL   Creatinine, Ser 2.59 0.50 - 1.35 mg/dL   Calcium 8.4 8.4 - 56.3 mg/dL   GFR calc non Af Amer 63 (L) >90 mL/min   GFR calc Af Amer 73 (L) >90 mL/min   Anion gap 11 5 - 15  Heparin level (unfractionated)     Status: None   Collection Time: 06/14/14 11:14 AM  Result Value Ref Range   Heparin Unfractionated 0.37 0.30 - 0.70 IU/mL  Glucose, capillary     Status: Abnormal   Collection Time: 06/14/14 11:43 AM  Result Value Ref Range   Glucose-Capillary 112 (H) 70 - 99 mg/dL   Dg Chest Port 1 View  06/13/2014   CLINICAL DATA:  Respiratory failure.  EXAM: PORTABLE CHEST - 1 VIEW  COMPARISON:  June 11, 2014.  FINDINGS: Stable cardiomediastinal silhouette. Endotracheal tube has been removed. Feeding tube is unchanged in position. Left-sided PICC line is also unchanged with distal tip overlying expected position of cavoatrial junction. No pneumothorax is noted. Increased bilateral perihilar and basilar opacities are noted concerning for edema or possibly pneumonia. Increased mild bilateral pleural effusions are noted as well.  IMPRESSION: Increased bilateral perihilar and basilar opacities are noted concerning for worsening edema or possibly pneumonia. Increased mild bilateral pleural effusions are noted as well.   Electronically Signed   By: Roque Lias M.D.   On: 06/13/2014 11:58   Ct Portable Head W/o Cm  06/12/2014   CLINICAL DATA:  Asymmetric pupils,  right larger than left  EXAM: CT HEAD WITHOUT CONTRAST  TECHNIQUE: Contiguous axial images were obtained from the base of the skull through the vertex without intravenous contrast.  COMPARISON:  Head CT June 04, 2014 and brain MRI June 08, 2014  FINDINGS: Mild diffuse atrophy is stable. There is no intracranial mass, hemorrhage, extra-axial fluid collection, or midline shift. There is mild patchy small vessel disease in the centra semiovale bilaterally. Elsewhere, gray-white  compartments appear normal. No acute infarct is appreciable on this study. The bony calvarium appears intact. The mastoid air cells are clear.  IMPRESSION: Mild diffuse atrophy with patchy periventricular small vessel disease. No intracranial mass, hemorrhage, extra-axial fluid collection, or acute appearing infarct.   Electronically Signed   By: Bretta Bang M.D.   On: 06/12/2014 19:13    Assessment/Plan: Diagnosis: Deconditioning secondary to respiratory failure secondary to aspiration pneumonia and upper GI bleed 1. Does the need for close, 24 hr/day medical supervision in concert with the patient's rehab needs make it unreasonable for this patient to be served in a less intensive setting? Yes 2. Co-Morbidities requiring supervision/potential complications: Recent sepsis, cardiomyopathy, cognitive deficits 3. Due to bladder management, bowel management, safety, skin/wound care, disease management, medication administration, pain management and patient education, does the patient require 24 hr/day rehab nursing? Yes 4. Does the patient require coordinated care of a physician, rehab nurse, PT (1-2 hrs/day, 5 days/week), OT (1-2 hrs/day, 5 days/week) and SLP (0.5-1 hrs/day, 5 days/week) to address physical and functional deficits in the context of the above medical diagnosis(es)? Yes Addressing deficits in the following areas: balance, endurance, locomotion, strength, transferring, bowel/bladder control, bathing, dressing,  feeding, grooming, toileting, cognition and swallowing 5. Can the patient actively participate in an intensive therapy program of at least 3 hrs of therapy per day at least 5 days per week? Yes 6. The potential for patient to make measurable gains while on inpatient rehab is good 7. Anticipated functional outcomes upon discharge from inpatient rehab are supervision  with PT, sup with OT, supervision with SLP. 8. Estimated rehab length of stay to reach the above functional goals is: 12-16days 9. Does the patient have adequate social supports and living environment to accommodate these discharge functional goals? Potentially 10. Anticipated D/C setting: Home 11. Anticipated post D/C treatments: HH therapy 12. Overall Rehab/Functional Prognosis: fair  RECOMMENDATIONS: This patient's condition is appropriate for continued rehabilitative care in the following setting: CIR  once medically stabilized Patient has agreed to participate in recommended program. Potentially Note that insurance prior authorization may be required for reimbursement for recommended care.  Comment: Elevated white count, edema versus infiltrate on chest x-ray, this needs to be addressed prior to any rehabilitation admission    06/14/2014

## 2014-06-14 NOTE — Progress Notes (Addendum)
NUTRITION FOLLOW UP  DOCUMENTATION CODES Per approved criteria  -Non-severe (moderate) malnutrition in the context of chronic illness   Pt meets criteria for moderate MALNUTRITION in the context of chronic illness as evidenced by mild-moderate depletion of muscle and subcutaneous fat mass.  Intervention:    Diet advancement as able per SLP.  If unable to start PO diet, recommend replace NGT and initiate TF with Jevity 1.2 at 25 ml/h and Prostat 30 ml once daily on day 1; on day 2, increase to goal rate of 65 ml/h (1560 ml per day) to provide 1972 kcals, 102 gm protein, 1264 ml free water daily.   Nutrition Dx:   Inadequate oral intake related to inability to eat as evidenced by NPO status, ongoing.  Goal:   Intake to meet >90% of estimated nutrition needs.  Monitor:   Diet advancement, PO intake, labs, weight trend.  Assessment:   Patient presented to the hospital on 1/18 with hematemesis and AMS. Intubated in the ED. EGD on 1/19 showed esophagitis.  Patient was extubated on 1/25. TF off since 1/27. Plans for FEES today with SLP to determine appropriate diet for patient. Plans for CIR consult. Remains NPO at this time.  Height: Ht Readings from Last 1 Encounters:  03/20/14  (1.753 m)    Weight Status:   Wt Readings from Last 1 Encounters:  06/14/14 160 lb 15 oz (73 kg)    Re-estimated needs:  Kcal: 1800-2000 Protein: 88-102 gm Fluid: 1.8-2 L  Skin: no wounds  Diet Order:  NPO   Intake/Output Summary (Last 24 hours) at 06/14/14 1453 Last data filed at 06/14/14 1406  Gross per 24 hour  Intake 1722.35 ml  Output   2825 ml  Net -1102.65 ml    Last BM: 1/27   Labs:   Recent Labs Lab 06/10/14 0400 06/11/14 0420  06/13/14 1116 06/14/14 0500 06/14/14 1100  NA 148* 149*  < > 146* 138 145  K 3.4* 3.5  < > 4.0 3.6 3.3*  CL 118* 118*  < > 115* 112 106  CO2 22 23  < > BUN 37* 38*  < > 30* 13 27*  CREATININE 1.18 1.06  < > 0.98 0.59 1.08   CALCIUM 8.2* 8.1*  < > 8.3* 8.0* 8.4  MG 1.7 1.9  --   --   --   --   PHOS 1.7* 2.8  --   --   --   --   GLUCOSE 241* 214*  < > 240* 97 162*  < > = values in this interval not displayed.  CBG (last 3)   Recent Labs  06/14/14 0358 06/14/14 0743 06/14/14 1143  GLUCAP 149* 135* 112*    Scheduled Meds: . antiseptic oral rinse  7 mL Mouth Rinse q12n4p  . arformoterol  15 mcg Nebulization BID  . budesonide (PULMICORT) nebulizer solution  0.5 mg Nebulization BID  . carvedilol  25 mg Oral BID WC  . chlorhexidine  15 mL Mouth Rinse BID  . clopidogrel  75 mg Oral Daily  . docusate  100 mg Per Tube Daily  . enalapril  10 mg Oral BID  . free water  200 mL Per Tube 3 times per day  . insulin aspart  2-6 Units Subcutaneous 6 times per day  . pantoprazole (PROTONIX) IV  40 mg Intravenous Q12H  . piperacillin-tazobactam (ZOSYN)  IV  3.375 g Intravenous Q8H  . potassium chloride  10 mEq Intravenous Q1  Hr x 4  . sodium chloride  10-40 mL Intracatheter Q12H  . vancomycin  1,000 mg Intravenous Q12H    Continuous Infusions: . sodium chloride 20 mL/hr at 06/12/14 2348  . dexmedetomidine Stopped (06/11/14 1000)  . feeding supplement (VITAL AF 1.2 CAL) Stopped (06/13/14 2331)  . heparin 1,100 Units/hr (06/13/14 1442)    Joaquin CourtsKimberly Jahmiyah Dullea, RD, LDN, CNSC Pager [phone] After Hours Pager [phone]

## 2014-06-14 NOTE — Progress Notes (Signed)
PULMONARY / CRITICAL CARE MEDICINE   Name: Marvin Leon MRN: [id number] DOB: 01-12-1933    ADMISSION DATE:  06/04/2014 CONSULTATION DATE:  06/04/2014  REFERRING MD :  EDP  CHIEF COMPLAINT:  VDRF and upper GI bleed  INITIAL PRESENTATION: 81 year old male hx of arthritis, GERD, HLD, HTN, CAD s/p 3 stents, emphysema, PAD , DMII on daily ASA and plavix since 02/22/14 since DES  iliac stent placement 02/2014, but no NSAID use who presents to the hospital with hematemesis and AMS.  In the ED the patient was noted to not be protecting his airway and was intubated for airway protection.  OGT was placed with 1500 ml of coffee ground black material was noted.  PCCM was called to admit to the ICU.  No recent etoh history. Normal cscope and endoscope 2009.  STUDIES:  1/18  NGT lavaged with only coffee grounds, no active bleeding. 1/18  CT - mild atrophy.  1/18  KUB - normal. 1/19  EGD. LA class D esophagitis. this is the source of the bleeding. 1/20 Echo- EF 25%. Diffuse hypokinesis. No classic Takotsubo's, but function worse towards apex. No thrombi.  1/22 MRI Brain- small 5mm acute infarct in left occipital lobe- likely not cause for mental status changes 1/22 EEG- slow with triphasic waves, consistent with patient's condition. No seizures. 1/26 CT Head, no acute infarct 1/27 CXR- Increased bilateral opacities, concern for PNA. Increased bilateral pleural effusions.   SIGNIFICANT EVENTS: 1/18  UGI bleed. hgb dropped. 1/19  EGD, weaned off pressor.  1/20  Failed extubation. Off pressors 1/22  Agitation overnight, on precedex / fentanyl, low grade fevers 1/25 Hbg 7.7, transfused 1 unit PRBCs 1/25 LUE Doppler +DVT, started on heparin gtt 1/26 Acute anisocoria noted, CT head neg. Ophtho eval- Adies pupil vs. physiologic 1/27 Hypoxia in 80s, placed back on BiPAP  SUBJECTIVE:  Resting in bed, comfortable on nasal cannula. Still seems confused, alert and oriented to person only.   VITAL  SIGNS: Temp:  [97.7 F (36.5 C)-99.6 F (37.6 C)] 98.5 F (36.9 C) (01/28 0744) Pulse Rate:  [38-104] 88 (01/28 0700) Resp:  [22-39] 28 (01/28 0700) BP: (123-156)/(55-103) 137/63 mmHg (01/28 0700) SpO2:  [85 %-100 %] 97 % (01/28 0700) FiO2 (%):  [40 %] 40 % (01/27 1602) Weight:  [73 kg (160 lb 15 oz)] 73 kg (160 lb 15 oz) (01/28 0409)   HEMODYNAMICS:     VENTILATOR SETTINGS: Vent Mode:  [-] BIPAP FiO2 (%):  [40 %] 40 % Set Rate:  [8 bmp] 8 bmp PEEP:  [6 cmH20] 6 cmH20 Plateau Pressure:  [10 cmH20] 10 cmH20   INTAKE / OUTPUT:  Intake/Output Summary (Last 24 hours) at 06/14/14 0801 Last data filed at 06/14/14 0700  Gross per 24 hour  Intake 2236.35 ml  Output   3425 ml  Net -1188.65 ml    PHYSICAL EXAMINATION: General: resting in bed, NAD Neuro: alert and oriented x 1. PERRL. Follows simple commands HEENT:  [place]/AT, EOMI, PERRL Cardiovascular: RRR, no m/g/r Lungs: CTA bilaterally, breaths non-labored on [place] Abdomen: BS+, soft, non-distended, non-tender Musculoskeletal: no acute deformities Skin:  Warm, dry, no edema   LABS:  CBC  Recent Labs Lab 06/12/14 0344 06/13/14 0350 06/14/14 0500  WBC 21.9* 21.3* 6.0  HGB 10.1* 10.0* 9.0*  HCT 30.0* 30.5* 27.9*  PLT 180 232 81*   Coag's  Recent Labs Lab 06/11/14 1740  APTT 30  INR 1.23   BMET  Recent Labs Lab 06/12/14 0344 06/13/14  1116 06/14/14 0500  NA 148* 146* 138  K 3.4* 4.0 3.6  CL 115* 115* 112  CO2 BUN 31* 30* 13  CREATININE 1.04 0.98 0.59  GLUCOSE 204* 240* 97   Electrolytes  Recent Labs Lab 06/10/14 0400 06/11/14 0420 06/12/14 0344 06/13/14 1116 06/14/14 0500  CALCIUM 8.2* 8.1* 8.4 8.3* 8.0*  MG 1.7 1.9  --   --   --   PHOS 1.7* 2.8  --   --   --      Sepsis Markers  Recent Labs Lab 06/10/14 0400 06/13/14 1525 06/14/14 0500  PROCALCITON 1.24 0.29 25.09     ABG  Recent Labs Lab 06/12/14 0640  PHART 7.498*  PCO2ART 26.6*  PO2ART 35.0*   Liver Enzymes   No results for input(s): AST, ALT, ALKPHOS, BILITOT, ALBUMIN in the last 168 hours. Cardiac Enzymes No results for input(s): TROPONINI, PROBNP in the last 168 hours. Glucose  Recent Labs Lab 06/13/14 0427 06/13/14 0822 06/13/14 1246 06/13/14 1552 06/13/14 1933 06/14/14 0023  GLUCAP 200* 192* 177* 156* 163* 137*    Imaging Dg Chest Port 1 View  06/13/2014   CLINICAL DATA:  Respiratory failure.  EXAM: PORTABLE CHEST - 1 VIEW  COMPARISON:  June 11, 2014.  FINDINGS: Stable cardiomediastinal silhouette. Endotracheal tube has been removed. Feeding tube is unchanged in position. Left-sided PICC line is also unchanged with distal tip overlying expected position of cavoatrial junction. No pneumothorax is noted. Increased bilateral perihilar and basilar opacities are noted concerning for edema or possibly pneumonia. Increased mild bilateral pleural effusions are noted as well.  IMPRESSION: Increased bilateral perihilar and basilar opacities are noted concerning for worsening edema or possibly pneumonia. Increased mild bilateral pleural effusions are noted as well.   Electronically Signed   By: Roque Lias M.D.   On: 06/13/2014 11:58     ASSESSMENT / PLAN:  PULMONARY OETT 1/18>>>1/25 A:  VDRF - due to AMS, hematemesis- Resolved. Extubated 1/25.  Aspiration pneumonia- doing better, but then new infiltrates on CXR 1/27 ?COPD- heavy smoking history P:   Continue on nasal cannula BiPAP as needed Incentive spirometry Continue Vanc and Zosyn  Continue Brovana, Pulmicort, Duonebs  CARDIOVASCULAR L IJ TLC 1/18 >>1/24 PICC 1/24>>  A:  Sinus Tachycardia -  with occasional wide complex tachy - likely 2/2 to blood loss, sepsis, shock and NSTEMI with peak Tp 1.5- resolving  Septic shock - improved, off pressors CAD Troponin elevation - likely 2/2 to demand/shock > improved Severe systolic CHF EF 25% on ECHO 06/06/14. ?Takotsubo appearance DVT in LUE P:  Continue heparin gtt Remove PICC  line due to DVT when able, ensure peripheral access Continue plavix  Continue coreg 25 mg BID, vasotec 10 mg BID per cardiology Consider repeat ECHO when patient improves from current episode. May need to have ICD if EF remains this low. Continue Metoprolol 2.5 mg IV PRN HR >125  RENAL A:   Acute on Chronic Renal Failure - baseline sr cr ~ 1.4,  Acute rise with acute blood loss vs septic shock. - Resolved Hypomagnesemia- improving  Hypokalemia  Hypophosphatemia - improving  Hypernatremia P:   Trend BMP Replace electrolytes as indicated  Free water added for hyperNa   GASTROINTESTINAL A:   LA class D esophagitis - identified source of the bleeding per EGD report.  P:   Protonix BID, needs daily indefinitely  Continue plavix, monitor closely for new bleeding  Continue TFs  HEMATOLOGIC A:   Acute on  Chronic Anemia - baseline hgb ~ 12-13, likely from esophagitis seen on EGD. No more drop, stable now. Chronic Thrombocytopenia    DVT in LUE P:  Continue heparin gtt CBC daily  Transfuse goal hgb <8 since has Cardiac disease. DVT ppx:  Heparin gtt, SCD's   INFECTIOUS A:  Fever - Improved. Has remained afebrile Leukocytosis- continues to trend up. New PNA vs. DVT Aspiration pneumonia - improving, but then new infiltrates on CXR 1/27 P:   BCx2 1/18 >> negative UC 1/18 >> negative Sputum 1/18 >> negative  CSF 1/21 >> negative L IJ tip culture 1/24>> negative   Blood cx 1/27 >>  Cefepime 1/18 >>1/20 Flagyl 1/18 >> 1/23 Ceftriaxone 1/20 >> 1/24   Vanc 1/27 >> Zosyn 1/27>>  Remains afebrile. WBC has been trending up. Repeating CBC since dramatic change in values compared to yesterday. Procalcitonin elevated at 25>> more concern for PNA Will continue antibiotics for 5 days total (end date 06/17/14)   ENDOCRINE A:   DM.   AI ruled out P:   SSI    NEUROLOGIC A:   Acute encephalopathy - in setting of shock, blood loss. Patient was improving, but seems more  confused over last few days. Unclear etiology- ?Infection/PNA Agitation - resolved.  Likely opiate dependance since he is on large dose at home. Anisocoria- noted on exam yesterday, now pupils symmetric. Eval by ophtho- Adies pupil vs. physiologic P:   Ativan PRN since was on benzo at home. Would restart home xanax when takes PO.  Mobilize / PT as able  Outpatient ophthalmology appointment    FAMILY  - Updates: family updated 1/27. Code status discussed. Patient remains full code for now, but family to discuss further.      Rich Numberarly Rivet, MD Internal Medicine Resident, PGY-1   Attending:  I have seen and examined the patient with nurse practitioner/resident and agree with the note above.   He looks stronger today, lungs clear; he actually walked in ICU!  Acute respiratory failure > due to pulm edema, possibly HCAP, continue diuresis, Abx 5 days CHF > per cardiology Deconditioning> PT consult  Code status Partial no CPR, Vent OK;  Need ongoing discussions with family about this as yesterday I advised that should he go back on life support he would likely not leave the hospital  To SDU, TRH service  Heber CarolinaBrent Idaly Verret, MD [facility] Pager: [phone] Cell: [phone] If no response, call [phone]

## 2014-06-14 NOTE — Progress Notes (Signed)
[facility] FOR [facility] LAB REPLACEMENT ONLY  The patient does [facility] based on the criteria listed below:   1. Is GFR >/= 40 ml/min? Yes.    Patient's GFR today is 83 2. Is urine output >/= 0.5 ml/kg/hr for the last 6 hours? Yes.   Patient's UOP is 1.5 ml/kg/hr 3. Is BUN < 60 mg/dL? Yes.    Patient's BUN today is 13 4. Abnormal electrolyte(s): K3.6 5. Ordered repletion with: K/tube 6. If a panic level lab has been reported, has the CCM MD in charge been notified? Yes.  .   Physician:  Thea GistV Sood,MD  Joniqua Sidle William 06/14/2014 6:56 AM

## 2014-06-14 NOTE — Progress Notes (Signed)
PROGRESS NOTE  Subjective:   Marvin Leon is a 81 yo with hx of CAD, carotid artery diesease admitted with GI bleed   Developed coffee ground emesis,  Developed sinus tach . Mild elevation of Troponin   Echo shows  Severe LV dysfunction with EF of 25%. Normal right sided function We have been gradually increasing his CHF meds   Objective:    Vital Signs:   Temp:  [98.2 F (36.8 C)-99.6 F (37.6 C)] 98.2 F (36.8 C) (01/28 1209) Pulse Rate:  [80-104] 80 (01/28 1300) Resp:  [22-39] 31 (01/28 1300) BP: (123-156)/(55-84) 129/63 mmHg (01/28 1300) SpO2:  [94 %-100 %] 100 % (01/28 1300) FiO2 (%):  [40 %] 40 % (01/27 1602) Weight:  [160 lb 15 oz (73 kg)] 160 lb 15 oz (73 kg) (01/28 0409)  Last BM Date: 06/13/14   24-hour weight change: Weight change: -8 lb 9.6 oz (-3.9 kg)  Weight trends: Filed Weights   06/12/14 0500 06/13/14 0500 06/14/14 0409  Weight: 0.1 oz (0.003 kg) 169 lb 8.5 oz (76.9 kg) 160 lb 15 oz (73 kg)    Intake/Output:  01/27 0701 - 01/28 0700 In: 2327.9 [I.V.:747.9; NG/GT:1280; IV Piggyback:300] Out: 3425 [Urine:3425] Total I/O In: 155 [I.V.:155] Out: 400 [Urine:400]   Physical Exam: BP 129/63 mmHg  Pulse 80  Temp(Src) 98.2 F (36.8 C) (Oral)  Resp 31  Wt 160 lb 15 oz (73 kg)  SpO2 100%  Wt Readings from Last 3 Encounters:  06/14/14 160 lb 15 oz (73 kg)  03/20/14 162 lb 8 oz (73.71 kg)  02/23/14 165 lb 9.1 oz (75.1 kg)    General: Vital signs reviewed and noted. Chronically ill appearing   Head: Normocephalic, atraumatic.  Eyes: conjunctivae/corneas clear.  EOM's intact.   Throat: normal  Neck:  normal   Lungs:    clear ant.   Heart:  Rr   Abdomen:  Soft, non-tender, non-distended    Extremities: Trace edema   Neurologic: A&O X3, CN II - XII are grossly intact. , slow to respond   Psych: Normal     Labs: BMET:  Recent Labs  06/14/14 0500 06/14/14 1100  NA 138 145  K 3.6 3.3*  CL 112 106  CO2 22 28  GLUCOSE 97 162*  BUN  13 27*  CREATININE 0.59 1.08  CALCIUM 8.0* 8.4    Liver function tests: No results for input(s): AST, ALT, ALKPHOS, BILITOT, PROT, ALBUMIN in the last 72 hours. No results for input(s): LIPASE, AMYLASE in the last 72 hours.  CBC:  Recent Labs  06/14/14 0500 06/14/14 1100  WBC 6.0 14.8*  HGB 9.0* 9.4*  HCT 27.9* 27.8*  MCV 90.9 91.4  PLT 81* 260    Cardiac Enzymes: No results for input(s): CKTOTAL, CKMB, TROPONINI in the last 72 hours.  Coagulation Studies:  Recent Labs  06/11/14 1740  LABPROT 15.7*  INR 1.23    Other: Invalid input(s): POCBNP No results for input(s): DDIMER in the last 72 hours. No results for input(s): HGBA1C in the last 72 hours. No results for input(s): CHOL, HDL, LDLCALC, TRIG, CHOLHDL in the last 72 hours. No results for input(s): TSH, T4TOTAL, T3FREE, THYROIDAB in the last 72 hours.  Invalid input(s): FREET3 No results for input(s): VITAMINB12, FOLATE, FERRITIN, TIBC, IRON, RETICCTPCT in the last 72 hours.   Other results:  EKG :  Sinus tach, ant. MI.   Medications:    Infusions: . sodium chloride 20 mL/hr at 06/12/14  2348  . dexmedetomidine Stopped (06/11/14 1000)  . feeding supplement (VITAL AF 1.2 CAL) Stopped (06/13/14 2331)  . heparin 1,100 Units/hr (06/13/14 1442)    Scheduled Medications: . antiseptic oral rinse  7 mL Mouth Rinse q12n4p  . arformoterol  15 mcg Nebulization BID  . budesonide (PULMICORT) nebulizer solution  0.5 mg Nebulization BID  . carvedilol  25 mg Oral BID WC  . chlorhexidine  15 mL Mouth Rinse BID  . clopidogrel  75 mg Oral Daily  . docusate  100 mg Per Tube Daily  . enalapril  10 mg Oral BID  . free water  200 mL Per Tube 3 times per day  . insulin aspart  2-6 Units Subcutaneous 6 times per day  . pantoprazole (PROTONIX) IV  40 mg Intravenous Q12H  . piperacillin-tazobactam (ZOSYN)  IV  3.375 g Intravenous Q8H  . potassium chloride  20 mEq Per Tube Q4H  . sodium chloride  10-40 mL Intracatheter  Q12H  . vancomycin  1,000 mg Intravenous Q12H    Assessment/ Plan:      1. Troponin elevation.  I suspect this was due to demand ischemia.   He will need to improve significantly before we start any cardiology work up. Doubt this was an acute coronary event.  2. Acute systolic CHF:  Continue supportive care.  He may have had a "takotsubo " like event.  Will continue  Coreg 25 bid .   On  Vasotec   10 mg BID .   No new additional  recs.   HR and BP are well controlled. Will sign off.  He may follow up with Dr. Allyson SabalBerry.    Disposition:  Length of Stay: 10  Vesta MixerPhilip J. Josiel Gahm, Montez HagemanJr., MD, [facility] 06/14/2014, 1:27 PM Office [phone] Pager [phone]

## 2014-06-14 NOTE — Progress Notes (Signed)
ANTICOAGULATION CONSULT NOTE - FOLLOW-UP  Pharmacy Consult:  Heparin Indication:  LUE DVT  Allergies  Allergen Reactions  . Darvocet [Propoxyphene N-Acetaminophen] Nausea And Vomiting  . Penicillins Nausea And Vomiting    Patient Measurements: Weight: 160 lb 15 oz (73 kg)  Vital Signs: Temp: 98.2 F (36.8 C) (01/28 1209) Temp Source: Oral (01/28 1209) BP: 130/67 mmHg (01/28 1000) Pulse Rate: 80 (01/28 1200)  Labs:  Recent Labs  06/11/14 1740  06/13/14 0350 06/13/14 1116 06/14/14 0500 06/14/14 1100 06/14/14 1114  HGB  --   < > 10.0*  --  9.0* 9.4*  --   HCT  --   < > 30.5*  --  27.9* 27.8*  --   PLT  --   < > 232  --  81* 260  --   APTT 30  --   --   --   --   --   --   LABPROT 15.7*  --   --   --   --   --   --   INR 1.23  --   --   --   --   --   --   HEPARINUNFRC  --   < > 0.61  --  0.21*  --  0.37  CREATININE  --   < >  --  0.98 0.59 1.08  --   < > = values in this interval not displayed.  Estimated Creatinine Clearance: 55.5 mL/min (by C-G formula based on Cr of 1.08).   Assessment: [age] YOM presented on 06/04/14 with VDRF possibly due to shock/NSTEMI, found to have an upper GIB and was tachycardiac. 06/11/14 Doppler showed DVT of the left brachial and axillary veins and patient was started on IV heparin.  Repeat heparin level therapeutic as expected.  No bleeding reported.   Goal of Therapy:  Heparin level 0.3-0.7 units/ml Monitor platelets by anticoagulation protocol: Yes  Vanc trough: 15-20 mcg/mL    Plan:  - Continue heparin gtt at 1100 units/hr - Daily CBC / HL - F/U PO anticoagulation - Vanc 1gm IV Q12H - Zosyn 3.375gm IV Q8H, 4 hr infusion - Monitor renal fxn, clinical progress, vanc trough as indicated    Claritza July D. Laney Potashang, PharmD, BCPS Pager:  [phone] 06/14/2014, 12:53 PM

## 2014-06-14 NOTE — Progress Notes (Signed)
Physical Therapy Treatment Patient Details Name: GEOVONNI MEYERHOFF MRN: [id number] DOB: 05-Feb-1933 Today's Date: 06/14/2014    History of Present Illness 81 year old male hx of arthritis, GERD, HLD, HTN, CAD s/p 3 stents, emphysema, PAD , DMII  who presents to the hospital with hematemesis and AMS.  In the ED the patient was noted to not be protecting his airway and was intubated for airway protection. ETT 1/18-1/25. Pt also with MRI showing small acute left occipital infarct on 1/22.    PT Comments    Pt making steady progress.  Follow Up Recommendations  CIR     Equipment Recommendations  Other (comment) (to be determined)    Recommendations for Other Services OT consult     Precautions / Restrictions Precautions Precautions: Fall Restrictions Weight Bearing Restrictions: No    Mobility  Bed Mobility Overal bed mobility: Needs Assistance Bed Mobility: Supine to Sit     Supine to sit: Mod assist;HOB elevated     General bed mobility comments: Verbal cues for technique and assist to elevate trunk.  Transfers Overall transfer level: Needs assistance Equipment used: Rolling walker (2 wheeled) Transfers: Sit to/from Stand Sit to Stand: Mod assist         General transfer comment: Pt with posterior lean. Cues for hand placement.  Ambulation/Gait Ambulation/Gait assistance: Min assist;Mod assist;+2 safety/equipment Ambulation Distance (Feet): 25 Feet (x 2) Assistive device: Rolling walker (2 wheeled) Gait Pattern/deviations: Step-through pattern;Decreased step length - right;Decreased step length - left;Shuffle;Leaning posteriorly;Trunk flexed   Gait velocity interpretation: Below normal speed for age/gender General Gait Details: Followed with chair for safety. Pt with posterior lean which incr as distance incr resulting in more assistance needed. Pt veering left and assist to steer walker.   Stairs            Wheelchair Mobility    Modified Rankin (Stroke  Patients Only)       Balance Overall balance assessment: Needs assistance Sitting-balance support: No upper extremity supported Sitting balance-Leahy Scale: Fair       Standing balance-Leahy Scale: Poor Standing balance comment: Walker and min to mod A for static standing.                    Cognition Arousal/Alertness: Awake/alert Behavior During Therapy: Flat affect Overall Cognitive Status: Impaired/Different from baseline Area of Impairment: Problem solving     Memory: Decreased short-term memory Following Commands: Follows one step commands consistently;Follows one step commands with increased time Safety/Judgement: Decreased awareness of safety;Decreased awareness of deficits   Problem Solving: Requires verbal cues;Requires tactile cues      Exercises      General Comments        Pertinent Vitals/Pain Pain Assessment: No/denies pain    Home Living                      Prior Function            PT Goals (current goals can now be found in the care plan section) Progress towards PT goals: Progressing toward goals    Frequency  Min 3X/week    PT Plan Discharge plan needs to be updated    Co-evaluation             End of Session Equipment Utilized During Treatment: Gait belt;Oxygen Activity Tolerance: Patient tolerated treatment well Patient left: in chair;with call bell/phone within reach;with family/visitor present     Time: 1191-4782 PT Time Calculation (min) (ACUTE ONLY):  23 min  Charges:  $Gait Training: 23-37 mins                    G Codes:      Regis Hinton 06/14/2014, 1:31 PM  [facility] Zylan Almquist PT [phone]

## 2014-06-14 NOTE — Progress Notes (Signed)
UR Completed.  [phone]  

## 2014-06-14 NOTE — Plan of Care (Signed)
Problem: SLP Dysphagia Goals Goal: Patient will demonstrate readiness for PO's Patient will demonstrate readiness for PO's and/or instrumental swallow study as evidenced by: As evidenced by 80% intelligibility of phonatory output and strong cough with min cues

## 2014-06-14 NOTE — Procedures (Signed)
Objective Swallowing Evaluation: Fiberoptic Endoscopic Evaluation of Swallowing  Patient Details  Name: Marvin Leon MRN: [id number] Date of Birth: 1932/12/12  Today's Date: 06/14/2014 Time: SLP Start Time (ACUTE ONLY): 1332-SLP Stop Time (ACUTE ONLY): 1355 SLP Time Calculation (min) (ACUTE ONLY): 23 min  Past Medical History:  Past Medical History  Diagnosis Date  . Arthritis   . GERD (gastroesophageal reflux disease)   . Hyperlipidemia   . Hypertension   . Coronary artery disease   . Carotid artery disease   . Peripheral arterial disease     50% ostial right common iliac artery stenosis without claudication  . Emphysema   . Shortness of breath   . Diabetes mellitus without complication     type 2  . Anxiety    Past Surgical History:  Past Surgical History  Procedure Laterality Date  . Colonoscopy    . Cardiac catheterization      3 stents placed  . Cholecystectomy N/A 04/07/2013    Procedure: LAPAROSCOPIC CHOLECYSTECTOMY WITH INTRAOPERATIVE CHOLANGIOGRAM;  Surgeon: Robyne Askew, MD;  Location: [facility] OR;  Service: General;  Laterality: N/A;  . Iliac artery stent Right 02/22/2014    Dr. Allyson Sabal 7 mm x 22 mm  ICast covered stent  . Coronary angioplasty      all 3 heart vessels   . Esophagogastroduodenoscopy Left 06/05/2014    Procedure: ESOPHAGOGASTRODUODENOSCOPY (EGD);  Surgeon: Theda Belfast, MD;  Location: [facility] ENDOSCOPY;  Service: Endoscopy;  Laterality: Left;   HPI:  HPI: 81 year old male hx of arthritis, GERD, HLD, HTN, CAD s/p 3 stents, emphysema, PAD, admitted with UGI bleed. NGT lavaged. Noted to be agitated overnight 1/22. 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 Intubated 1/18-1/25 for respiratory failure. Also with esophagitis and Aspiration pneumonia - improving, but then new infiltrates on CXR 1/27.   No Data Recorded  Assessment / Plan / Recommendation CHL IP CLINICAL IMPRESSIONS 06/14/2014   Dysphagia Diagnosis Severe pharyngeal phase dysphagia  Clinical impression Patient presents with a severe pharyngeal phase dysphagia characterized by weakness of laryngeal and pharyngeal musculature resulting in decreased airway protection during the swallow, and moderate residuals (increased with increased viscosity) resulting in penetration/aspiration post swallow. Episodes of decreased airway protection silent in nature with sensation late in study as aspiration events increased. Use of chin tuck, change in bolus size ineffective to prevent. Suspect that dysphagia acute, and reversible in nature given prolonged intubation and generalized weakness. Prognosis for recovery good with time off vent and improved strength. Consider temporary non-oral means of nutrition with close SLP f/u to monitor for improvements and readiness for repeat instrumental testing.       CHL IP TREATMENT RECOMMENDATION 06/14/2014  Treatment Plan Recommendations Therapy as outlined in treatment plan below     CHL IP DIET RECOMMENDATION 06/14/2014  Diet Recommendations NPO;Alternative means - temporary;Ice chips PRN after oral care  Liquid Administration via (None)  Medication Administration Via alternative means  Compensations (None)  Postural Changes and/or Swallow Maneuvers (None)     CHL IP OTHER RECOMMENDATIONS 06/14/2014  Recommended Consults (None)  Oral Care Recommendations Oral care prior to ice chips  Other Recommendations (None)     CHL IP FOLLOW UP RECOMMENDATIONS 06/14/2014  Follow up Recommendations Inpatient Rehab     CHL IP FREQUENCY AND DURATION 06/14/2014  Speech Therapy Frequency (ACUTE ONLY) min 3x week  Treatment Duration 2 weeks  SLP Swallow Goals No flowsheet data found.  No flowsheet data found.    CHL IP REASON FOR REFERRAL 06/14/2014  Reason for Referral Objectively evaluate swallowing function     CHL IP ORAL PHASE 06/14/2014  Lips (None)  Tongue (None)  Mucous membranes  (None)  Nutritional status (None)  Other (None)  Oxygen therapy (None)  Oral Phase WFL  Oral - Pudding Teaspoon (None)  Oral - Pudding Cup (None)  Oral - Honey Teaspoon (None)  Oral - Honey Cup (None)  Oral - Honey Syringe (None)  Oral - Nectar Teaspoon (None)  Oral - Nectar Cup (None)  Oral - Nectar Straw (None)  Oral - Nectar Syringe (None)  Oral - Ice Chips (None)  Oral - Thin Teaspoon (None)  Oral - Thin Cup (None)  Oral - Thin Straw (None)  Oral - Thin Syringe (None)  Oral - Puree (None)  Oral - Mechanical Soft (None)  Oral - Regular (None)  Oral - Multi-consistency (None)  Oral - Pill (None)  Oral Phase - Comment (None)      CHL IP PHARYNGEAL PHASE 06/14/2014  Pharyngeal Phase Impaired  Pharyngeal - Pudding Teaspoon (None)  Penetration/Aspiration details (pudding teaspoon) (None)  Pharyngeal - Pudding Cup (None)  Penetration/Aspiration details (pudding cup) (None)  Pharyngeal - Honey Teaspoon (None)  Penetration/Aspiration details (honey teaspoon) (None)  Pharyngeal - Honey Cup (None)  Penetration/Aspiration details (honey cup) (None)  Pharyngeal - Honey Syringe (None)  Penetration/Aspiration details (honey syringe) (None)  Pharyngeal - Nectar Teaspoon Delayed swallow initiation;Premature spillage to valleculae;Reduced anterior laryngeal mobility;Reduced laryngeal elevation;Reduced airway/laryngeal closure;Penetration/Aspiration during swallow;Penetration/Aspiration after swallow;Penetration/Aspiration before swallow;Pharyngeal residue - valleculae;Pharyngeal residue - pyriform sinuses  Penetration/Aspiration details (nectar teaspoon) Material enters airway, passes BELOW cords without attempt by patient to eject out (silent aspiration)  Pharyngeal - Nectar Cup (None)  Penetration/Aspiration details (nectar cup) (None)  Pharyngeal - Nectar Straw (None)  Penetration/Aspiration details (nectar straw) (None)  Pharyngeal - Nectar Syringe (None)  Penetration/Aspiration  details (nectar syringe) (None)  Pharyngeal - Ice Chips Delayed swallow initiation;Reduced anterior laryngeal mobility;Reduced laryngeal elevation;Reduced airway/laryngeal closure;Penetration/Aspiration during swallow;Penetration/Aspiration after swallow;Penetration/Aspiration before swallow;Pharyngeal residue - valleculae;Pharyngeal residue - pyriform sinuses;Premature spillage to pyriform sinuses  Penetration/Aspiration details (ice chips) Material enters airway, passes BELOW cords without attempt by patient to eject out (silent aspiration)  Pharyngeal - Thin Teaspoon Delayed swallow initiation;Reduced anterior laryngeal mobility;Reduced laryngeal elevation;Reduced airway/laryngeal closure;Penetration/Aspiration during swallow;Penetration/Aspiration after swallow;Penetration/Aspiration before swallow;Pharyngeal residue - valleculae;Pharyngeal residue - pyriform sinuses;Premature spillage to pyriform sinuses  Penetration/Aspiration details (thin teaspoon) Material enters airway, passes BELOW cords without attempt by patient to eject out (silent aspiration)  Pharyngeal - Thin Cup (None)  Penetration/Aspiration details (thin cup) (None)  Pharyngeal - Thin Straw (None)  Penetration/Aspiration details (thin straw) (None)  Pharyngeal - Thin Syringe (None)  Penetration/Aspiration details (thin syringe') (None)  Pharyngeal - Puree Delayed swallow initiation;Premature spillage to valleculae;Reduced anterior laryngeal mobility;Reduced laryngeal elevation;Reduced airway/laryngeal closure;Penetration/Aspiration after swallow;Pharyngeal residue - valleculae;Pharyngeal residue - pyriform sinuses;Penetration/Aspiration during swallow;Lateral channel residue  Penetration/Aspiration details (puree) Material enters airway, CONTACTS cords and not ejected out  Pharyngeal - Mechanical Soft (None)  Penetration/Aspiration details (mechanical soft) (None)  Pharyngeal - Regular (None)  Penetration/Aspiration details  (regular) (None)  Pharyngeal - Multi-consistency (None)  Penetration/Aspiration details (multi-consistency) (None)  Pharyngeal - Pill (None)  Penetration/Aspiration details (pill) (None)  Pharyngeal Comment (None)     No flowsheet data found.  No flowsheet data found.  Ferdinand Lango MA, CCC-SLP [phone]        Ferdinand Lango  Meryl 06/14/2014, 4:26 PM

## 2014-06-14 NOTE — Evaluation (Signed)
Clinical/Bedside Swallow Evaluation Patient Details  Name: Marvin Leon Stanke MRN: [id number] Date of Birth: 1932-09-22  Today's Date: 06/14/2014 Time: SLP Start Time (ACUTE ONLY): 0855 SLP Stop Time (ACUTE ONLY): 0914 SLP Time Calculation (min) (ACUTE ONLY): 19 min  Past Medical History:  Past Medical History  Diagnosis Date  . Arthritis   . GERD (gastroesophageal reflux disease)   . Hyperlipidemia   . Hypertension   . Coronary artery disease   . Carotid artery disease   . Peripheral arterial disease     50% ostial right common iliac artery stenosis without claudication  . Emphysema   . Shortness of breath   . Diabetes mellitus without complication     type 2  . Anxiety    Past Surgical History:  Past Surgical History  Procedure Laterality Date  . Colonoscopy    . Cardiac catheterization      3 stents placed  . Cholecystectomy N/A 04/07/2013    Procedure: LAPAROSCOPIC CHOLECYSTECTOMY WITH INTRAOPERATIVE CHOLANGIOGRAM;  Surgeon: Robyne AskewPaul S Toth III, MD;  Location: [facility] OR;  Service: General;  Laterality: N/A;  . Iliac artery stent Right 02/22/2014    Dr. Allyson SabalBerry 7 mm x 22 mm  ICast covered stent  . Coronary angioplasty      all 3 heart vessels   . Esophagogastroduodenoscopy Left 06/05/2014    Procedure: ESOPHAGOGASTRODUODENOSCOPY (EGD);  Surgeon: Theda BelfastPatrick D Hung, MD;  [facility];  Service: Endoscopy;  Laterality: Left;   HPI:  81 year old male hx of arthritis, GERD, HLD, HTN, CAD s/p 3 stents, emphysema, PAD, admitted with UGI bleed. NGT lavaged. Noted to be agitated overnight 1/22. 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 Intubated 1/18-1/25 for respiratory failure. Also with esophagitis and Aspiration pneumonia - improving, but then new infiltrates on CXR 1/27.    Assessment / Plan / Recommendation Clinical Impression  Patient presents with an acute reversible dysphagia s/p prolonged intubation  characterized by hoarse vocal quality, weak cough, and positive s/s of decreased airway protection with pos trialed. Recommend instrumental testing to determine least restrictive diet. Plan for FEES today.     Aspiration Risk  Moderate    Diet Recommendation NPO   Medication Administration: Via alternative means    Other  Recommendations Recommended Consults: FEES   Follow Up Recommendations       Frequency and Duration        Pertinent Vitals/Pain n/a        Swallow Study    General HPI: 81 year old male hx of arthritis, GERD, HLD, HTN, CAD s/p 3 stents, emphysema, PAD, admitted with UGI bleed. NGT lavaged. Noted to be agitated overnight 1/22. 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 Intubated 1/18-1/25 for respiratory failure. Also with esophagitis and Aspiration pneumonia - improving, but then new infiltrates on CXR 1/27.  Type of Study: Bedside swallow evaluation Previous Swallow Assessment: none noted Diet Prior to this Study: NPO Temperature Spikes Noted: No Respiratory Status: Nasal cannula History of Recent Intubation: Yes Length of Intubations (days): 7 days Date extubated: 06/11/14 Behavior/Cognition: Alert;Cooperative;Pleasant mood;Confused;Decreased sustained attention Oral Cavity - Dentition: Edentulous (unclear if has dentures) Self-Feeding Abilities: Needs assist;Able to feed self Patient Positioning: Upright in bed Baseline Vocal Quality: Low vocal intensity;Hoarse Volitional Cough: Weak Volitional Swallow: Able to elicit    Oral/Motor/Sensory Function Overall Oral Motor/Sensory Function: Impaired (generalized  weakness)   Ice Chips Ice chips: Impaired Presentation: Spoon Pharyngeal Phase Impairments: Decreased hyoid-laryngeal movement   Thin Liquid Thin Liquid: Impaired Presentation: Cup;Self Fed (with HOH assist) Oral Phase Impairments: Reduced labial seal Oral Phase Functional  Implications: Right anterior spillage;Left anterior spillage Pharyngeal  Phase Impairments: Suspected delayed Swallow;Decreased hyoid-laryngeal movement;Multiple swallows;Throat Clearing - Immediate;Throat Clearing - Delayed;Cough - Delayed    Nectar Thick Nectar Thick Liquid: Not tested   Honey Thick Honey Thick Liquid: Not tested   Puree Puree: Impaired Presentation: Spoon Pharyngeal Phase Impairments: Suspected delayed Swallow;Decreased hyoid-laryngeal movement;Multiple swallows;Throat Clearing - Delayed;Cough - Delayed   Solid   GO   Mesha Schamberger MA, CCC-SLP [phone]  Solid: Not tested       Jalan Bodi Meryl 06/14/2014,9:28 AM

## 2014-06-14 NOTE — Progress Notes (Signed)
Rehab Admissions Coordinator Note:  Patient was screened by Clois DupesBoyette, Marice Guidone Godwin for appropriateness for an Inpatient Acute Rehab Consult per PT recommendaiton. At this time, we are recommending Inpatient Rehab consult. I will ask RN CM to assist with obtaining rehab consult order   Clois DupesBoyette, Kervens Roper Godwin 06/14/2014, 2:15 PM  I can be reached at [phone].

## 2014-06-15 DIAGNOSIS — R000 Tachycardia, unspecified: Secondary | ICD-10-CM | POA: Diagnosis present

## 2014-06-15 DIAGNOSIS — I5022 Chronic systolic (congestive) heart failure: Secondary | ICD-10-CM

## 2014-06-15 DIAGNOSIS — D62 Acute posthemorrhagic anemia: Secondary | ICD-10-CM | POA: Diagnosis present

## 2014-06-15 DIAGNOSIS — I82629 Acute embolism and thrombosis of deep veins of unspecified upper extremity: Secondary | ICD-10-CM | POA: Diagnosis present

## 2014-06-15 DIAGNOSIS — I82622 Acute embolism and thrombosis of deep veins of left upper extremity: Secondary | ICD-10-CM

## 2014-06-15 DIAGNOSIS — I471 Supraventricular tachycardia: Secondary | ICD-10-CM

## 2014-06-15 DIAGNOSIS — J441 Chronic obstructive pulmonary disease with (acute) exacerbation: Secondary | ICD-10-CM

## 2014-06-15 DIAGNOSIS — E876 Hypokalemia: Secondary | ICD-10-CM

## 2014-06-15 DIAGNOSIS — N179 Acute kidney failure, unspecified: Secondary | ICD-10-CM | POA: Diagnosis present

## 2014-06-15 DIAGNOSIS — E870 Hyperosmolality and hypernatremia: Secondary | ICD-10-CM

## 2014-06-15 DIAGNOSIS — J690 Pneumonitis due to inhalation of food and vomit: Secondary | ICD-10-CM | POA: Diagnosis present

## 2014-06-15 DIAGNOSIS — R7989 Other specified abnormal findings of blood chemistry: Secondary | ICD-10-CM | POA: Diagnosis present

## 2014-06-15 DIAGNOSIS — R778 Other specified abnormalities of plasma proteins: Secondary | ICD-10-CM | POA: Diagnosis present

## 2014-06-15 DIAGNOSIS — N189 Chronic kidney disease, unspecified: Secondary | ICD-10-CM

## 2014-06-15 LAB — CBC
HCT: 28.7 % — ABNORMAL LOW (ref 39.0–52.0)
Hemoglobin: 9.4 g/dL — ABNORMAL LOW (ref 13.0–17.0)
MCH: 30.9 pg (ref 26.0–34.0)
MCHC: 32.8 g/dL (ref 30.0–36.0)
MCV: 94.4 fL (ref 78.0–100.0)
Platelets: 299 10*3/uL (ref 150–400)
RBC: 3.04 MIL/uL — AB (ref 4.22–5.81)
RDW: 15.9 % — ABNORMAL HIGH (ref 11.5–15.5)
WBC: 14 10*3/uL — AB (ref 4.0–10.5)

## 2014-06-15 LAB — BASIC METABOLIC PANEL
Anion gap: 8 (ref 5–15)
BUN: 25 mg/dL — ABNORMAL HIGH (ref 6–23)
CALCIUM: 8.9 mg/dL (ref 8.4–10.5)
CHLORIDE: 108 mmol/L (ref 96–112)
CO2: 30 mmol/L (ref 19–32)
Creatinine, Ser: 1.21 mg/dL (ref 0.50–1.35)
GFR calc Af Amer: 64 mL/min — ABNORMAL LOW (ref >90)
GFR calc non Af Amer: 55 mL/min — ABNORMAL LOW (ref >90)
Glucose, Bld: 129 mg/dL — ABNORMAL HIGH (ref 70–99)
POTASSIUM: 3.7 mmol/L (ref 3.5–5.1)
Sodium: 146 mmol/L — ABNORMAL HIGH (ref 135–145)

## 2014-06-15 LAB — GLUCOSE, CAPILLARY
GLUCOSE-CAPILLARY: 113 mg/dL — AB (ref 70–99)
GLUCOSE-CAPILLARY: 115 mg/dL — AB (ref 70–99)
GLUCOSE-CAPILLARY: 121 mg/dL — AB (ref 70–99)
GLUCOSE-CAPILLARY: 140 mg/dL — AB (ref 70–99)
GLUCOSE-CAPILLARY: 142 mg/dL — AB (ref 70–99)
Glucose-Capillary: 125 mg/dL — ABNORMAL HIGH (ref 70–99)

## 2014-06-15 LAB — PROCALCITONIN: Procalcitonin: 0.17 ng/mL

## 2014-06-15 LAB — CBC WITH DIFFERENTIAL/PLATELET
BASOS PCT: 0 % (ref 0–1)
Basophils Absolute: 0 10*3/uL (ref 0.0–0.1)
EOS PCT: 0 % (ref 0–5)
Eosinophils Absolute: 0.1 10*3/uL (ref 0.0–0.7)
HCT: 28.4 % — ABNORMAL LOW (ref 39.0–52.0)
HEMOGLOBIN: 9.4 g/dL — AB (ref 13.0–17.0)
LYMPHS PCT: 9 % — AB (ref 12–46)
Lymphs Abs: 1.2 10*3/uL (ref 0.7–4.0)
MCH: 31.8 pg (ref 26.0–34.0)
MCHC: 33.1 g/dL (ref 30.0–36.0)
MCV: 95.9 fL (ref 78.0–100.0)
Monocytes Absolute: 0.8 10*3/uL (ref 0.1–1.0)
Monocytes Relative: 6 % (ref 3–12)
NEUTROS ABS: 11.5 10*3/uL — AB (ref 1.7–7.7)
Neutrophils Relative %: 85 % — ABNORMAL HIGH (ref 43–77)
PLATELETS: 335 10*3/uL (ref 150–400)
RBC: 2.96 MIL/uL — AB (ref 4.22–5.81)
RDW: 15.6 % — ABNORMAL HIGH (ref 11.5–15.5)
WBC: 13.6 10*3/uL — ABNORMAL HIGH (ref 4.0–10.5)

## 2014-06-15 LAB — HEPARIN LEVEL (UNFRACTIONATED): Heparin Unfractionated: 0.51 IU/mL (ref 0.30–0.70)

## 2014-06-15 NOTE — Progress Notes (Signed)
[facility] TEAM 1 - Stepdown/ICU TEAM Progress Note  Marvin Leon:[id number] DOB: May 01, 1933 DOA: 06/04/2014 PCP: [name], MD  Admit HPI / Brief Narrative: 81 year old WM PMHx arthritis, GERD, HLD, HTN, CAD s/p 3 stents, emphysema, PAD , DMType 2 on daily ASA and plavix since 02/22/14 since DES iliac stent placement 02/2014, but no NSAID use who presents to the hospital with hematemesis and AMS. In the ED the patient was noted to not be protecting his airway and was intubated for airway protection. OGT was placed with 1500 ml of coffee ground black material was noted. PCCM was called to admit to the ICU. No recent etoh history. Normal cscope and endoscope 2009.   HPI/Subjective: 1/29 A/O 1 (unsure of where, when, why). Does know that he is in the hospital. Negative CP, negative SOB, complains of left lower extremity anterior pain (non-reproducible). States negative fall prior to admission. States uses home O2 1-2 L when active.  Assessment/Plan: GI bleed/Acute on Chronic Anemia - baseline hgb ~ 12-13, -LA class D esophagitis - identified source of the bleeding per EGD report.  -Continue Protonix 40 mg BID, needs daily indefinitely  -Continue plavix 75 mg daily, monitor closely for new bleeding   Aspiration pneumonia/COPD- heavy smoking history -Continue on nasal cannula; maintain SPO2 89% to 93% -BiPAP as needed -Flutter valve  -Continue Vanc and Zosyn  -Continue Brovana, Pulmicort, Duonebs  Sinus Tachycardia - with occasional wide complex tachy -Most likely multifactorial to include blood loss, sepsis, shock and NSTEMI with peak Tp 1.5- resolving   Severe systolic CHF EF 25% on ECHO 06/06/14. ?Takotsubo appearanceSeptic shock  - Continue Coreg 25 mg BID -Continue enalapril 10 mg BID -Continue Metoprolol 2.5 mg IV PRN HR >125  Troponin elevation  -Most likely likely 2/2 to demand/shock > improved  DVT in LUE -Continue heparin gtt -Remove PICC line due to  DVT when able, ensure peripheral access  Acute on Chronic Renal Failure  - baseline sr cr ~ 1.4,  -Improved  Hypomagnesemia -Check in a.m -Magnesium goal>2    Hypokalemia  -Potassium goal >4  Hypophosphatemia  -Check in a.m.  Hypernatremia -Improving       Code Status: FULL Family Communication: no family present at time of exam Disposition Plan: CIR    Consultants: Dr. Jeani Hawking [facility] GI)    Procedure/Significant Events: 1/18 NGT placed lavaged with 1500 ml coffee grounds, no active bleeding. 1/18 CT - mild atrophy.  1/18 KUB - normal. 1/18 UGI bleed. hgb dropped. 1/19 EGD, weaned off pressor. 1/19 EGD; LA class D esophagitis. this is the source of the bleeding. 1/20 echocardiogram; EF 25%. Diffuse hypokinesis. No classic Takotsubo's, but function worse towards apex. No thrombi.  1/20 Failed extubation. Off pressors 1/22 Agitation overnight, on precedex / fentanyl, low grade fevers 1/22 MRI Brain- small 5mm acute infarct in left occipital lobe- likely not cause for mental status changes 1/22 EEG- slow with triphasic waves, consistent with patient's condition. No seizures. 1/25 Hbg 7.7, transfused 1 unit PRBCs 1/25 LUE Doppler +DVT, started on heparin gtt 1/26 Acute anisocoria noted, CT head neg. Ophtho eval- Adies pupil vs. physiologic 1/26 CT Head, no acute infarct 1/27 CXR- Increased bilateral opacities, concern for PNA. Increased bilateral pleural effusions.  1/27 Hypoxia in 80s, placed back on BiPAP  Culture BCx2 1/18 >> negative UC 1/18 >> negative Sputum 1/18 >> negative  CSF 1/21 >> negative L IJ tip culture 1/24>> negative  Blood cx 1/27 >>  Antibiotics: Cefepime  1/18 >>1/20 Flagyl 1/18 >> 1/23 Ceftriaxone 1/20 >> 1/24  Vanc 1/27 >> Zosyn 1/27>>   DVT prophylaxis: Heparin per pharmacy   Devices NA   LINES / TUBES:  L IJ TLC 1/18 >>1/24 PICC 1/24>>      Continuous Infusions: . sodium chloride 20 mL/hr at  06/12/14 2348  . dexmedetomidine Stopped (06/11/14 1000)  . heparin 1,100 Units/hr (06/14/14 2000)    Objective: VITAL SIGNS: Temp: 98.4 F (36.9 C) (01/29 1947) Temp Source: Oral (01/29 1947) BP: 142/82 mmHg (01/29 1900) Pulse Rate: 95 (01/29 1900) SPO2; FIO2:   Intake/Output Summary (Last 24 hours) at 06/15/14 2033 Last data filed at 06/15/14 1900  Gross per 24 hour  Intake   1287 ml  Output   1965 ml  Net   -678 ml     Exam: General: A/O 1 (unsure of where, when, why). Does know that he is in the hospital, No acute respiratory distress Lungs: Clear to auscultation bilaterally without wheezes or crackles Cardiovascular: Regular rate and rhythm without murmur gallop or rub normal S1 and S2 Abdomen: Nontender, nondistended, soft, bowel sounds positive, no rebound, no ascites, no appreciable mass Extremities: No significant cyanosis, clubbing, or edema bilateral lower extremities  Data Reviewed: Basic Metabolic Panel:  Recent Labs Lab 06/10/14 0400 06/11/14 0420 06/12/14 0344 06/13/14 1116 06/14/14 0500 06/14/14 1100 06/15/14 0325  NA 148* 149* 148* 146* 138 145 146*  K 3.4* 3.5 3.4* 4.0 3.6 3.3* 3.7  CL 118* 118* 115* 115* 112 106 108  CO2 GLUCOSE 241* 214* 204* 240* 97 162* 129*  BUN 37* 38* 31* 30* 13 27* 25*  CREATININE 1.18 1.06 1.04 0.98 0.59 1.08 1.21  CALCIUM 8.2* 8.1* 8.4 8.3* 8.0* 8.4 8.9  MG 1.7 1.9  --   --   --   --   --   PHOS 1.7* 2.8  --   --   --   --   --    Liver Function Tests: No results for input(s): AST, ALT, ALKPHOS, BILITOT, PROT, ALBUMIN in the last 168 hours. No results for input(s): LIPASE, AMYLASE in the last 168 hours. No results for input(s): AMMONIA in the last 168 hours. CBC:  Recent Labs Lab 06/12/14 0344 06/13/14 0350 06/14/14 0500 06/14/14 1100 06/15/14 0325  WBC 21.9* 21.3* 6.0 14.8* 14.0*  HGB 10.1* 10.0* 9.0* 9.4* 9.4*  HCT 30.0* 30.5* 27.9* 27.8* 28.7*  MCV 90.6 92.7 90.9 91.4 94.4    PLT 180 232 81* 260 299   Cardiac Enzymes: No results for input(s): CKTOTAL, CKMB, CKMBINDEX, TROPONINI in the last 168 hours. BNP (last 3 results) No results for input(s): PROBNP in the last 8760 hours. CBG:  Recent Labs Lab 06/15/14 0359 06/15/14 0756 06/15/14 1201 06/15/14 1536 06/15/14 1925  GLUCAP 125* 121* 113* 115* 142*    Recent Results (from the past 240 hour(s))  CSF culture     Status: None   Collection Time: 06/07/14  3:00 PM  Result Value Ref Range Status   Specimen Description CSF  Final   Special Requests Normal  Final   Gram Stain   Final    WBC PRESENT, PREDOMINANTLY MONONUCLEAR NO ORGANISMS SEEN CYTOSPIN Performed at [facility] Performed at [facility]    Culture   Final    NO GROWTH 3 DAYS Performed at [facility]    Report Status 06/10/2014 FINAL  Final  Gram stain - STAT with CSF  culture     Status: None   Collection Time: 06/07/14  3:00 PM  Result Value Ref Range Status   Specimen Description CSF  Final   Special Requests Normal  Final   Gram Stain   Final    CYTOSPIN PREP WBC PRESENT, PREDOMINANTLY MONONUCLEAR NO ORGANISMS SEEN    Report Status 06/07/2014 FINAL  Final  MRSA PCR Screening     Status: None   Collection Time: 06/07/14  9:51 PM  Result Value Ref Range Status   MRSA by PCR NEGATIVE NEGATIVE Final    Comment:        The GeneXpert MRSA Assay (FDA approved for NASAL specimens only), is one component of a comprehensive MRSA colonization surveillance program. It is not intended to diagnose MRSA infection nor to guide or monitor treatment for MRSA infections.   Cath Tip Culture     Status: None   Collection Time: 06/10/14  2:13 PM  Result Value Ref Range Status   Specimen Description CATH TIP  Final   Special Requests FROM LEFT IJ  Final   Culture   Final    NO GROWTH 2 DAYS Performed at [facility]    Report Status 06/13/2014 FINAL  Final  Culture, blood (routine x 2)     Status:  None (Preliminary result)   Collection Time: 06/13/14  3:20 PM  Result Value Ref Range Status   Specimen Description BLOOD RIGHT ARM  Final   Special Requests BOTTLES DRAWN AEROBIC ONLY 1 CC  Final   Culture   Final           BLOOD CULTURE RECEIVED NO GROWTH TO DATE CULTURE WILL BE HELD FOR 5 DAYS BEFORE ISSUING A FINAL NEGATIVE REPORT Performed at [facility]    Report Status PENDING  Incomplete  Culture, blood (routine x 2)     Status: None (Preliminary result)   Collection Time: 06/13/14  3:43 PM  Result Value Ref Range Status   Specimen Description BLOOD RIGHT FOREARM  Final   Special Requests BOTTLES DRAWN AEROBIC ONLY 1.5 CC  Final   Culture   Final           BLOOD CULTURE RECEIVED NO GROWTH TO DATE CULTURE WILL BE HELD FOR 5 DAYS BEFORE ISSUING A FINAL NEGATIVE REPORT Performed at [facility]    Report Status PENDING  Incomplete  Culture, Urine     Status: None   Collection Time: 06/13/14  7:52 PM  Result Value Ref Range Status   Specimen Description URINE, CATHETERIZED  Final   Special Requests Normal  Final   Colony Count NO GROWTH Performed at [facility]   Final   Culture NO GROWTH Performed at [facility]   Final   Report Status 06/14/2014 FINAL  Final     Studies:  Recent x-ray studies have been reviewed in detail by the Attending Physician  Scheduled Meds:  Scheduled Meds: . antiseptic oral rinse  7 mL Mouth Rinse q12n4p  . arformoterol  15 mcg Nebulization BID  . budesonide (PULMICORT) nebulizer solution  0.5 mg Nebulization BID  . carvedilol  25 mg Oral BID WC  . chlorhexidine  15 mL Mouth Rinse BID  . clopidogrel  75 mg Oral Daily  . docusate  100 mg Per Tube Daily  . enalapril  10 mg Oral BID  . insulin aspart  2-6 Units Subcutaneous 6 times per day  . pantoprazole (PROTONIX) IV  40 mg Intravenous Q12H  .  piperacillin-tazobactam (ZOSYN)  IV  3.375 g Intravenous Q8H  . sodium chloride  10-40 mL Intracatheter Q12H   . vancomycin  1,000 mg Intravenous Q12H    Time spent on care of this patient: 40 mins   Drema DallasWOODS, Marvin Stabler, J , [facility]  [facility] Office  [phone] Pager - [phone]  On-Call/Text Page:      [email or web address]      password [email or web address]  If 7PM-7AM, please contact night-coverage [email or web address] Password [email or web address] 06/15/2014, 8:33 PM   LOS: 11 days

## 2014-06-15 NOTE — Progress Notes (Signed)
OT Cancellation Note  Patient Details Name: Marvin Leon MRN: [id number] DOB: 26-Sep-1932   Cancelled Treatment:    Reason Eval/Treat Not Completed: Fatigue/lethargy limiting ability to participate (pt declined OOB, will continue to follow)  Evern BioMayberry, Nadean Montanaro Lynn 06/15/2014, 1:35 PM

## 2014-06-15 NOTE — Progress Notes (Signed)
I will contact pt's next of kin to begin discussions for his different rehab options pending insurance approval and bed avaliability when pt medically ready. [phone]

## 2014-06-15 NOTE — Progress Notes (Signed)
Daughter brought patient's dentures in a plastic grocery bag. I placed them with patient's belongings in the top drawer of the bedside dresser.

## 2014-06-15 NOTE — Progress Notes (Signed)
Speech Language Pathology Treatment: Dysphagia  Patient Details Name: Marvin Leon MRN: [id number] DOB: 17-Feb-1933 Today's Date: 06/15/2014 Time: [id number] SLP Time Calculation (min) (ACUTE ONLY): 10 min  Assessment / Plan / Recommendation Clinical Impression  F/u after yesterday's FEES.  Pt confused, looking for his shoes so he can go home.   Voice/cough stronger; continues with s/s of dysphagia with ice chips/water, but clinical signs are less severe.  Min cues for attention, cough/throat clear. NG has been pulled.  Recommend proceeding with MBS next date to determine readiness to begin POs.      HPI HPI: 81 year old male hx of arthritis, GERD, HLD, HTN, CAD s/p 3 stents, emphysema, PAD, admitted with UGI bleed. NGT lavaged. Noted to be agitated overnight 1/22. 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 Intubated 1/18-1/25 for respiratory failure. Also with esophagitis and Aspiration pneumonia - improving, but then new infiltrates on CXR 1/27.    Pertinent Vitals Pain Assessment: No/denies pain  SLP Plan  MBS    Recommendations Diet recommendations: NPO              Oral Care Recommendations: Oral care prior to ice chips Follow up Recommendations: Inpatient Rehab Plan: MBS    Marvin Leon, [place] CCC/SLP Pager [phone]      Marvin Leon, Marvin Leon 06/15/2014, 3:28 PM

## 2014-06-15 NOTE — Progress Notes (Signed)
ANTICOAGULATION CONSULT NOTE - FOLLOW-UP  Pharmacy Consult:  Heparin Indication:  LUE DVT  Allergies  Allergen Reactions  . Darvocet [Propoxyphene N-Acetaminophen] Nausea And Vomiting  . Penicillins Nausea And Vomiting    Patient Measurements: Weight: 165 lb 12.6 oz (75.2 kg)  Heparin dosing weight = 75 kg  Vital Signs: Temp: 98.7 F (37.1 C) (01/29 0401) Temp Source: Oral (01/29 0401) BP: 138/65 mmHg (01/29 0700) Pulse Rate: 85 (01/29 0700)  Labs:  Recent Labs  06/14/14 0500 06/14/14 1100 06/14/14 1114 06/15/14 0325  HGB 9.0* 9.4*  --  9.4*  HCT 27.9* 27.8*  --  28.7*  PLT 81* 260  --  299  HEPARINUNFRC 0.21*  --  0.37 0.51  CREATININE 0.59 1.08  --  1.21    Estimated Creatinine Clearance: 49.5 mL/min (by C-G formula based on Cr of 1.21).   Assessment: Marvin Leon with new left axillary brachial veins DVT to continue on IV heparin.  Heparin level therapeutic.  No overt bleeding documented although patient had a recent GIB.   Goal of Therapy:  Heparin level 0.3-0.7 units/ml Monitor platelets by anticoagulation protocol: Yes  Vanc trough: 15-20 mcg/mL    Plan:  - Continue heparin gtt at 1100 units/hr - Daily CBC / HL - F/U PO anticoagulation - Vanc 1gm IV Q12H - Zosyn 3.375gm IV Q8H, 4 hr infusion - Monitor renal fxn, clinical progress, vanc trough tomorrow    Luisana Lutzke D. Laney Potashang, PharmD, BCPS Pager:  [phone] 06/15/2014, 7:37 AM

## 2014-06-16 ENCOUNTER — Inpatient Hospital Stay (HOSPITAL_COMMUNITY): Payer: Medicare Other

## 2014-06-16 DIAGNOSIS — R1310 Dysphagia, unspecified: Secondary | ICD-10-CM | POA: Insufficient documentation

## 2014-06-16 DIAGNOSIS — J690 Pneumonitis due to inhalation of food and vomit: Secondary | ICD-10-CM | POA: Insufficient documentation

## 2014-06-16 LAB — GLUCOSE, CAPILLARY
Glucose-Capillary: 106 mg/dL — ABNORMAL HIGH (ref 70–99)
Glucose-Capillary: 109 mg/dL — ABNORMAL HIGH (ref 70–99)
Glucose-Capillary: 113 mg/dL — ABNORMAL HIGH (ref 70–99)
Glucose-Capillary: 117 mg/dL — ABNORMAL HIGH (ref 70–99)
Glucose-Capillary: 117 mg/dL — ABNORMAL HIGH (ref 70–99)
Glucose-Capillary: 132 mg/dL — ABNORMAL HIGH (ref 70–99)

## 2014-06-16 LAB — COMPREHENSIVE METABOLIC PANEL
ALT: 26 U/L (ref 0–53)
AST: 22 U/L (ref 0–37)
Albumin: 2.4 g/dL — ABNORMAL LOW (ref 3.5–5.2)
Alkaline Phosphatase: 65 U/L (ref 39–117)
Anion gap: 11 (ref 5–15)
BUN: 23 mg/dL (ref 6–23)
CO2: 25 mmol/L (ref 19–32)
Calcium: 9.1 mg/dL (ref 8.4–10.5)
Chloride: 107 mmol/L (ref 96–112)
Creatinine, Ser: 1.41 mg/dL — ABNORMAL HIGH (ref 0.50–1.35)
GFR calc Af Amer: 53 mL/min — ABNORMAL LOW (ref >90)
GFR calc non Af Amer: 46 mL/min — ABNORMAL LOW (ref >90)
Glucose, Bld: 128 mg/dL — ABNORMAL HIGH (ref 70–99)
Potassium: 3.7 mmol/L (ref 3.5–5.1)
Sodium: 143 mmol/L (ref 135–145)
Total Bilirubin: 1.4 mg/dL — ABNORMAL HIGH (ref 0.3–1.2)
Total Protein: 5.4 g/dL — ABNORMAL LOW (ref 6.0–8.3)

## 2014-06-16 LAB — VANCOMYCIN, TROUGH: VANCOMYCIN TR: 33.1 ug/mL — AB (ref 10.0–20.0)

## 2014-06-16 LAB — MAGNESIUM: Magnesium: 1.9 mg/dL (ref 1.5–2.5)

## 2014-06-16 LAB — CBC
HEMATOCRIT: 30.3 % — AB (ref 39.0–52.0)
Hemoglobin: 9.9 g/dL — ABNORMAL LOW (ref 13.0–17.0)
MCH: 30.7 pg (ref 26.0–34.0)
MCHC: 32.7 g/dL (ref 30.0–36.0)
MCV: 94.1 fL (ref 78.0–100.0)
PLATELETS: 372 10*3/uL (ref 150–400)
RBC: 3.22 MIL/uL — ABNORMAL LOW (ref 4.22–5.81)
RDW: 15.5 % (ref 11.5–15.5)
WBC: 12.6 10*3/uL — AB (ref 4.0–10.5)

## 2014-06-16 LAB — HEPARIN LEVEL (UNFRACTIONATED): Heparin Unfractionated: 0.49 IU/mL (ref 0.30–0.70)

## 2014-06-16 LAB — TROPONIN I: Troponin I: 0.05 ng/mL — ABNORMAL HIGH (ref <0.031)

## 2014-06-16 LAB — VANCOMYCIN, RANDOM: Vancomycin Rm: 20.7 ug/mL

## 2014-06-16 LAB — PHOSPHORUS: Phosphorus: 4.3 mg/dL (ref 2.3–4.6)

## 2014-06-16 MED ORDER — VANCOMYCIN HCL IN DEXTROSE 1-5 GM/200ML-% IV SOLN
1000.0000 mg | INTRAVENOUS | Status: DC
  Administered 2014-06-16 – 2014-06-17 (×2): 1000 mg via INTRAVENOUS
  Filled 2014-06-16 (×3): qty 200

## 2014-06-16 NOTE — Progress Notes (Signed)
[facility] TEAM 1 - Stepdown/ICU TEAM Progress Note  Marvin Leon ZOX:[id number] DOB: 03-02-1933 DOA: 06/04/2014 PCP: Pamelia HoitWILSON,FRED HENRY, MD  Admit HPI / Brief Narrative: 81 year old WM PMHx arthritis, GERD, HLD, HTN, CAD s/p 3 stents, emphysema, PAD , DMType 2 on daily ASA and plavix since 02/22/14 since DES iliac stent placement 02/2014, but no NSAID use who presents to the hospital with hematemesis and AMS. In the ED the patient was noted to not be protecting his airway and was intubated for airway protection. OGT was placed with 1500 ml of coffee ground black material was noted. PCCM was called to admit to the ICU. No recent etoh history. Normal cscope and endoscope 2009.   HPI/Subjective: 1/30 A/O 1 (unsure of where, when, why). Does know that he is in the hospital. Negative CP, negative SOB,  Assessment/Plan: GI bleed/Acute blood loss anemia on Chronic Anemia - baseline hgb ~ 12-13, -H/H stable -LA class D esophagitis - identified source of the bleeding per EGD report.  -Continue Protonix 40 mg BID, needs daily indefinitely  -Continue plavix 75 mg daily, monitor closely for new bleeding   Aspiration pneumonia/COPD- heavy smoking history -Continue on nasal cannula; maintain SPO2 89% to 93% -BiPAP as needed -Flutter valve  -Continue Vanc and Zosyn  -Continue Brovana, Pulmicort, Duonebs  Severe dysphasia -1/30 patient failed modified barium swallow. Will remain NPO -Could retest in 72 hours -NG tube placement -Consult dietitian for NG tube feedings -Will contact family in a.m. and discuss possibility of requiring PEG tube in the future  Sinus Tachycardia - with occasional wide complex tachy -Most likely multifactorial to include blood loss, sepsis, shock and NSTEMI with peak Tp 1.5- resolving  -Currently in NSR   Severe systolic CHF EF 25% on ECHO 06/06/14. ?Takotsubo appearanceSeptic shock  - Continue Coreg 25 mg BID after NG tube placed -Continue enalapril 10 mg  BID after NG tube placed -Continue Metoprolol 2.5 mg IV PRN HR >125  Troponin elevation  -Most likely likely 2/2 to demand/shock -Will trend troponins  DVT in LUE -Continue heparin gtt -Remove PICC line due to DVT. -1/30 IV team unsuccessful in placing peripheral access, will need to continue using PICC line.  Acute on Chronic Renal Failure  - baseline sr cr ~ 1.4,  -At baseline   Hypomagnesemia -Check in a.m -Magnesium goal>2    Hypokalemia  -Potassium goal >4  Hypophosphatemia  -Check in a.m.  Hypernatremia -Resolved       Code Status: FULL Family Communication: no family present at time of exam Disposition Plan: CIR    Consultants: Dr. Jeani HawkingPatrick Hung [facility])    Procedure/Significant Events: 1/18 NGT placed lavaged with 1500 ml coffee grounds, no active bleeding. 1/18 CT - mild atrophy.  1/18 KUB - normal. 1/18 UGI bleed. hgb dropped. 1/19 EGD, weaned off pressor. 1/19 EGD; LA class D esophagitis. this is the source of the bleeding. 1/20 echocardiogram; EF 25%. Diffuse hypokinesis. No classic Takotsubo's, but function worse towards apex. No thrombi.  1/20 Failed extubation. Off pressors 1/22 Agitation overnight, on precedex / fentanyl, low grade fevers 1/22 MRI Brain- small 5mm acute infarct in left occipital lobe- likely not cause for mental status changes 1/22 EEG- slow with triphasic waves, consistent with patient's condition. No seizures. 1/25 Hbg 7.7, transfused 1 unit PRBCs 1/25 LUE Doppler +DVT, started on heparin gtt 1/26 Acute anisocoria noted, CT head neg. Ophtho eval- Adies pupil vs. physiologic 1/26 CT Head, no acute infarct 1/27 CXR- Increased bilateral opacities,  concern for PNA. Increased bilateral pleural effusions.  1/27 Hypoxia in 80s, placed back on BiPAP  Culture BCx2 1/18 >> negative UC 1/18 >> negative Sputum 1/18 >> negative  CSF 1/21 >> negative L IJ tip culture 1/24>> negative  Blood cx 1/27  >>  Antibiotics: Cefepime 1/18 >>1/20 Flagyl 1/18 >> 1/23 Ceftriaxone 1/20 >> 1/24  Vanc 1/27 >> Zosyn 1/27>>   DVT prophylaxis: Heparin per pharmacy   Devices NA   LINES / TUBES:  L IJ TLC 1/18 >>1/24 PICC 1/24>>      Continuous Infusions: . sodium chloride 20 mL/hr at 06/16/14 1100  . dexmedetomidine Stopped (06/11/14 1000)  . heparin 1,100 Units/hr (06/16/14 1100)    Objective: VITAL SIGNS: Temp: 98.2 F (36.8 C) (01/30 1259) Temp Source: Oral (01/30 1259) BP: 159/74 mmHg (01/30 1259) Pulse Rate: 96 (01/30 1100) SPO2; FIO2:   Intake/Output Summary (Last 24 hours) at 06/16/14 1438 Last data filed at 06/16/14 1259  Gross per 24 hour  Intake    931 ml  Output    950 ml  Net    -19 ml     Exam: General: A/O 1 (unsure of where, when, why). Does know that he is in the hospital, No acute respiratory distress Lungs: Clear to auscultation bilaterally without wheezes or crackles Cardiovascular: Regular rate and rhythm without murmur gallop or rub normal S1 and S2 Abdomen: Nontender, nondistended, soft, bowel sounds positive, no rebound, no ascites, no appreciable mass Extremities: No significant cyanosis, clubbing, or edema bilateral lower extremities  Data Reviewed: Basic Metabolic Panel:  Recent Labs Lab 06/10/14 0400 06/11/14 0420  06/13/14 1116 06/14/14 0500 06/14/14 1100 06/15/14 0325 06/16/14 0323  NA 148* 149*  < > 146* 138 145 146* 143  K 3.4* 3.5  < > 4.0 3.6 3.3* 3.7 3.7  CL 118* 118*  < > 115* 112 106 108 107  CO2 22 23  < > 24 22 28 30 25   GLUCOSE 241* 214*  < > 240* 97 162* 129* 128*  BUN 37* 38*  < > 30* 13 27* 25* 23  CREATININE 1.18 1.06  < > 0.98 0.59 1.08 1.21 1.41*  CALCIUM 8.2* 8.1*  < > 8.3* 8.0* 8.4 8.9 9.1  MG 1.7 1.9  --   --   --   --   --  1.9  PHOS 1.7* 2.8  --   --   --   --   --  4.3  < > = values in this interval not displayed. Liver Function Tests:  Recent Labs Lab 06/16/14 0323  AST 22  ALT 26  ALKPHOS 65   BILITOT 1.4*  PROT 5.4*  ALBUMIN 2.4*   No results for input(s): LIPASE, AMYLASE in the last 168 hours. No results for input(s): AMMONIA in the last 168 hours. CBC:  Recent Labs Lab 06/14/14 0500 06/14/14 1100 06/15/14 0325 06/15/14 2100 06/16/14 0323  WBC 6.0 14.8* 14.0* 13.6* 12.6*  NEUTROABS  --   --   --  11.5*  --   HGB 9.0* 9.4* 9.4* 9.4* 9.9*  HCT 27.9* 27.8* 28.7* 28.4* 30.3*  MCV 90.9 91.4 94.4 95.9 94.1  PLT 81* 260 299 335 372   Cardiac Enzymes: No results for input(s): CKTOTAL, CKMB, CKMBINDEX, TROPONINI in the last 168 hours. BNP (last 3 results) No results for input(s): PROBNP in the last 8760 hours. CBG:  Recent Labs Lab 06/15/14 1536 06/15/14 1925 06/15/14 2345 06/16/14 0415 06/16/14 0726  GLUCAP 115* 142* 109*  106* 132*    Recent Results (from the past 240 hour(s))  CSF culture     Status: None   Collection Time: 06/07/14  3:00 PM  Result Value Ref Range Status   Specimen Description CSF  Final   Special Requests Normal  Final   Gram Stain   Final    WBC PRESENT, PREDOMINANTLY MONONUCLEAR NO ORGANISMS SEEN CYTOSPIN Performed at [facility] Performed at [facility]    Culture   Final    NO GROWTH 3 DAYS Performed at [facility]    Report Status 06/10/2014 FINAL  Final  Gram stain - STAT with CSF culture     Status: None   Collection Time: 06/07/14  3:00 PM  Result Value Ref Range Status   Specimen Description CSF  Final   Special Requests Normal  Final   Gram Stain   Final    CYTOSPIN PREP WBC PRESENT, PREDOMINANTLY MONONUCLEAR NO ORGANISMS SEEN    Report Status 06/07/2014 FINAL  Final  MRSA PCR Screening     Status: None   Collection Time: 06/07/14  9:51 PM  Result Value Ref Range Status   MRSA by PCR NEGATIVE NEGATIVE Final    Comment:        The GeneXpert MRSA Assay (FDA approved for NASAL specimens only), is one component of a comprehensive MRSA colonization surveillance program. It is  not intended to diagnose MRSA infection nor to guide or monitor treatment for MRSA infections.   Cath Tip Culture     Status: None   Collection Time: 06/10/14  2:13 PM  Result Value Ref Range Status   Specimen Description CATH TIP  Final   Special Requests FROM LEFT IJ  Final   Culture   Final    NO GROWTH 2 DAYS Performed at [facility]    Report Status 06/13/2014 FINAL  Final  Culture, blood (routine x 2)     Status: None (Preliminary result)   Collection Time: 06/13/14  3:20 PM  Result Value Ref Range Status   Specimen Description BLOOD RIGHT ARM  Final   Special Requests BOTTLES DRAWN AEROBIC ONLY 1 CC  Final   Culture   Final           BLOOD CULTURE RECEIVED NO GROWTH TO DATE CULTURE WILL BE HELD FOR 5 DAYS BEFORE ISSUING A FINAL NEGATIVE REPORT Performed at [facility]    Report Status PENDING  Incomplete  Culture, blood (routine x 2)     Status: None (Preliminary result)   Collection Time: 06/13/14  3:43 PM  Result Value Ref Range Status   Specimen Description BLOOD RIGHT FOREARM  Final   Special Requests BOTTLES DRAWN AEROBIC ONLY 1.5 CC  Final   Culture   Final           BLOOD CULTURE RECEIVED NO GROWTH TO DATE CULTURE WILL BE HELD FOR 5 DAYS BEFORE ISSUING A FINAL NEGATIVE REPORT Performed at [facility]    Report Status PENDING  Incomplete  Culture, Urine     Status: None   Collection Time: 06/13/14  7:52 PM  Result Value Ref Range Status   Specimen Description URINE, CATHETERIZED  Final   Special Requests Normal  Final   Colony Count NO GROWTH Performed at [facility]   Final   Culture NO GROWTH Performed at [facility]   Final   Report Status 06/14/2014 FINAL  Final     Studies:  Recent x-ray studies have been reviewed in detail by the Attending Physician  Scheduled Meds:  Scheduled Meds: . antiseptic oral rinse  7 mL Mouth Rinse q12n4p  . arformoterol  15 mcg Nebulization BID  . budesonide  (PULMICORT) nebulizer solution  0.5 mg Nebulization BID  . carvedilol  25 mg Oral BID WC  . chlorhexidine  15 mL Mouth Rinse BID  . clopidogrel  75 mg Oral Daily  . docusate  100 mg Per Tube Daily  . enalapril  10 mg Oral BID  . insulin aspart  2-6 Units Subcutaneous 6 times per day  . pantoprazole (PROTONIX) IV  40 mg Intravenous Q12H  . piperacillin-tazobactam (ZOSYN)  IV  3.375 g Intravenous Q8H  . sodium chloride  10-40 mL Intracatheter Q12H    Time spent on care of this patient: 40 mins   Drema Dallas [facility]  [facility] Office  [phone] Pager - [phone]  On-Call/Text Page:      [email or web address]      password [email or web address]  If 7PM-7AM, please contact night-coverage [email or web address] Password [email or web address] 06/16/2014, 2:38 PM   LOS: 12 days

## 2014-06-16 NOTE — Procedures (Signed)
Objective Swallowing Evaluation: Modified Barium Swallowing Study  Patient Details  Name: Marvin Leon MRN: [id number] Date of Birth: Apr 08, 1933  Today's Date: 06/16/2014 Time: SLP Start Time (ACUTE ONLY): 1130-SLP Stop Time (ACUTE ONLY): 1200 SLP Time Calculation (min) (ACUTE ONLY): 30 min  Past Medical History:  Past Medical History  Diagnosis Date  . Arthritis   . GERD (gastroesophageal reflux disease)   . Hyperlipidemia   . Hypertension   . Coronary artery disease   . Carotid artery disease   . Peripheral arterial disease     50% ostial right common iliac artery stenosis without claudication  . Emphysema   . Shortness of breath   . Diabetes mellitus without complication     type 2  . Anxiety    Past Surgical History:  Past Surgical History  Procedure Laterality Date  . Colonoscopy    . Cardiac catheterization      3 stents placed  . Cholecystectomy N/A 04/07/2013    Procedure: LAPAROSCOPIC CHOLECYSTECTOMY WITH INTRAOPERATIVE CHOLANGIOGRAM;  Surgeon: Robyne AskewPaul S Toth III, MD;  Location: [facility] OR;  Service: General;  Laterality: N/A;  . Iliac artery stent Right 02/22/2014    Dr. Allyson SabalBerry 7 mm x 22 mm  ICast covered stent  . Coronary angioplasty      all 3 heart vessels   . Esophagogastroduodenoscopy Left 06/05/2014    Procedure: ESOPHAGOGASTRODUODENOSCOPY (EGD);  Surgeon: Theda BelfastPatrick D Hung, MD;  [facility];  Service: Endoscopy;  Laterality: Left;   HPI:  HPI: 81 year old male hx of arthritis, GERD, HLD, HTN, CAD s/p 3 stents, emphysema, PAD, admitted with UGI bleed. NGT lavaged. Noted to be agitated overnight 1/22. 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 Intubated 1/18-1/25 for respiratory failure. Also with esophagitis and Aspiration pneumonia - improving, but then new infiltrates on CXR 1/27.   No Data Recorded  Assessment / Plan / Recommendation CHL IP CLINICAL IMPRESSIONS 06/16/2014  Dysphagia  Diagnosis Severe pharyngeal phase dysphagia;Severe oral phase dysphagia  Clinical impression Patient presents with a severe oropharyngeal dysphagia, characterized by oral and pharyngeal muscle weakness resulting in significant delay in anterior to posterior transit of puree and nextar thick liquid boluses despite patient trying to initiate swallow. Patient also presents with a sensory component of his dysphagia, characterized by swallow initiation delays to vallecular sinus with puree and nectar thick liquids, and pyriform sinus with thin liquids. Patient did not sense penetration until it had contacted his cords. Thin liquids resulted in silent aspiration. Moderate  amount of residuals remained in vallecular sinus at end of study, despite cued dry swallows and patient trying to swallow. Presence of green dye from FEES on 1/28 on patient's tongue. At this time, patient is not safe to consume any P.O.'s.       CHL IP TREATMENT RECOMMENDATION 06/16/2014  Treatment Plan Recommendations Therapy as outlined in treatment plan below     CHL IP DIET RECOMMENDATION 06/16/2014  Diet Recommendations NPO;Alternative means - temporary;Ice chips PRN after oral care  Liquid Administration via (None)  Medication Administration Via alternative means  Compensations (None)  Postural Changes and/or Swallow Maneuvers (None)     CHL IP OTHER RECOMMENDATIONS 06/16/2014  Recommended Consults (None)  Oral Care Recommendations Oral care prior to ice chips  Other Recommendations (None)     CHL IP FOLLOW UP RECOMMENDATIONS 06/16/2014  Follow up Recommendations Inpatient Rehab     CHL IP FREQUENCY AND DURATION  06/16/2014  Speech Therapy Frequency (ACUTE ONLY) min 3x week  Treatment Duration 2 weeks     Pertinent Vitals/Pain     SLP Swallow Goals No flowsheet data found.  No flowsheet data found.    CHL IP REASON FOR REFERRAL 06/16/2014  Reason for Referral Objectively evaluate swallowing function     CHL IP ORAL  PHASE 06/16/2014  Lips (None)  Tongue (None)  Mucous membranes (None)  Nutritional status (None)  Other (None)  Oxygen therapy (None)  Oral Phase Impaired  Oral - Pudding Teaspoon NT  Oral - Pudding Cup NT  Oral - Honey Teaspoon (None)  Oral - Honey Cup (None)  Oral - Honey Syringe (None)  Oral - Nectar Teaspoon (None)  Oral - Nectar Cup (None)  Oral - Nectar Straw (None)  Oral - Nectar Syringe (None)  Oral - Ice Chips (None)  Oral - Thin Teaspoon (None)  Oral - Thin Cup (None)  Oral - Thin Straw (None)  Oral - Thin Syringe (None)  Oral - Puree Weak lingual manipulation;Lingual pumping;Delayed oral transit;Reduced posterior propulsion;Lingual/palatal residue  Oral - Mechanical Soft (None)  Oral - Regular (None)  Oral - Multi-consistency (None)  Oral - Pill (None)  Oral Phase - Comment (None)      CHL IP PHARYNGEAL PHASE 06/16/2014  Pharyngeal Phase (None)  Pharyngeal - Pudding Teaspoon NT  Penetration/Aspiration details (pudding teaspoon) (None)  Pharyngeal - Pudding Cup (None)  Penetration/Aspiration details (pudding cup) (None)  Pharyngeal - Honey Teaspoon (None)  Penetration/Aspiration details (honey teaspoon) (None)  Pharyngeal - Honey Cup (None)  Penetration/Aspiration details (honey cup) (None)  Pharyngeal - Honey Syringe (None)  Penetration/Aspiration details (honey syringe) (None)  Pharyngeal - Nectar Teaspoon Delayed swallow initiation;Premature spillage to valleculae;Pharyngeal residue - valleculae;Reduced laryngeal elevation;Reduced tongue base retraction;Pharyngeal residue - pyriform sinuses;Penetration/Aspiration during swallow  Penetration/Aspiration details (nectar teaspoon) (None)  Pharyngeal - Nectar Cup Delayed swallow initiation;Premature spillage to valleculae;Pharyngeal residue - pyriform sinuses;Reduced tongue base retraction;Reduced laryngeal elevation;Penetration/Aspiration during swallow;Pharyngeal residue - valleculae  Penetration/Aspiration  details (nectar cup) Material enters airway, passes BELOW cords then ejected out  Pharyngeal - Nectar Straw (None)  Penetration/Aspiration details (nectar straw) (None)  Pharyngeal - Nectar Syringe (None)  Penetration/Aspiration details (nectar syringe) (None)  Pharyngeal - Ice Chips NT  Penetration/Aspiration details (ice chips) (None)  Pharyngeal - Thin Teaspoon Reduced laryngeal elevation;Penetration/Aspiration during swallow;Reduced tongue base retraction;Reduced airway/laryngeal closure;Premature spillage to pyriform sinuses  Penetration/Aspiration details (thin teaspoon) Material enters airway, passes BELOW cords without attempt by patient to eject out (silent aspiration)  Pharyngeal - Thin Cup NT  Penetration/Aspiration details (thin cup) (None)  Pharyngeal - Thin Straw (None)  Penetration/Aspiration details (thin straw) (None)  Pharyngeal - Thin Syringe (None)  Penetration/Aspiration details (thin syringe') (None)  Pharyngeal - Puree Delayed swallow initiation;Reduced tongue base retraction;Pharyngeal residue - valleculae;Pharyngeal residue - pyriform sinuses;Premature spillage to valleculae;Reduced laryngeal elevation;Reduced pharyngeal peristalsis;Penetration/Aspiration after swallow  Penetration/Aspiration details (puree) Material enters airway, remains ABOVE vocal cords then ejected out  Pharyngeal - Mechanical Soft (None)  Penetration/Aspiration details (mechanical soft) (None)  Pharyngeal - Regular (None)  Penetration/Aspiration details (regular) (None)  Pharyngeal - Multi-consistency (None)  Penetration/Aspiration details (multi-consistency) (None)  Pharyngeal - Pill (None)  Penetration/Aspiration details (pill) (None)  Pharyngeal Comment (None)     No flowsheet data found.  No flowsheet data found.         Elio Forget Tarrell 06/16/2014, 1:11 PM  Angela Nevin, MA, CCC-SLP 06/16/2014 1:12 PM

## 2014-06-16 NOTE — Progress Notes (Signed)
Dr. Joseph ArtWoods insisted to assess patient for PIV in order to d/c left PICC. 2 IV nurses assessed patient with ultrasound thoroughly, but unable to find suitable veins. Notified primary RN.

## 2014-06-16 NOTE — Progress Notes (Signed)
ANTIBIOTIC CONSULT NOTE - FOLLOW UP  Pharmacy Consult for vancomycin Indication: pneumonia  Labs:  Recent Labs  06/14/14 1100 06/15/14 0325 06/15/14 2100 06/16/14 0323  WBC 14.8* 14.0* 13.6* 12.6*  HGB 9.4* 9.4* 9.4* 9.9*  PLT 260 299 335 372  CREATININE 1.08 1.21  --  1.41*   Estimated Creatinine Clearance: 42.5 mL/min (by C-G formula based on Cr of 1.41).  Recent Labs  06/16/14 0323  VANCOTROUGH 33.1*     Microbiology: Recent Results (from the past 720 hour(s))  Blood culture (routine x 2)     Status: None   Collection Time: 06/04/14 11:50 AM  Result Value Ref Range Status   Specimen Description BLOOD HAND LEFT  Final   Special Requests BOTTLES DRAWN AEROBIC AND ANAEROBIC 2CC  Final   Culture   Final    NO GROWTH 5 DAYS Performed at [facility]    Report Status 06/10/2014 FINAL  Final  Blood culture (routine x 2)     Status: None   Collection Time: 06/04/14 11:54 AM  Result Value Ref Range Status   Specimen Description BLOOD LEFT FOREARM  Final   Special Requests BOTTLES DRAWN AEROBIC ONLY 2CC  Final   Culture   Final    NO GROWTH 5 DAYS Performed at [facility]    Report Status 06/10/2014 FINAL  Final  Culture, blood (routine x 2)     Status: None   Collection Time: 06/04/14  1:50 PM  Result Value Ref Range Status   Specimen Description BLOOD LEFT ANTECUBITAL  Final   Special Requests BOTTLES DRAWN AEROBIC AND ANAEROBIC  Final   Culture   Final    NO GROWTH 5 DAYS Note: Culture results may be compromised due to an excessive volume of blood received in culture bottles. Performed at [facility]    Report Status 06/10/2014 FINAL  Final  Culture, blood (routine x 2)     Status: None   Collection Time: 06/04/14  2:00 PM  Result Value Ref Range Status   Specimen Description BLOOD LEFT HAND  Final   Special Requests BOTTLES DRAWN AEROBIC AND ANAEROBIC  Final   Culture   Final    NO GROWTH 5 DAYS Performed at [facility]    Report Status 06/10/2014 FINAL  Final  MRSA PCR Screening     Status: None   Collection Time: 06/04/14  4:20 PM  Result Value Ref Range Status   MRSA by PCR NEGATIVE NEGATIVE Final    Comment:        The GeneXpert MRSA Assay (FDA approved for NASAL specimens only), is one component of a comprehensive MRSA colonization surveillance program. It is not intended to diagnose MRSA infection nor to guide or monitor treatment for MRSA infections.   Urine culture     Status: None   Collection Time: 06/04/14  4:46 PM  Result Value Ref Range Status   Specimen Description URINE, CATHETERIZED  Final   Special Requests Normal  Final   Colony Count NO GROWTH Performed at [facility]   Final   Culture NO GROWTH Performed at [facility]   Final   Report Status 06/05/2014 FINAL  Final  Culture, respiratory (NON-Expectorated)     Status: None   Collection Time: 06/04/14  4:46 PM  Result Value Ref Range Status   Specimen Description TRACHEAL ASPIRATE  Final   Special Requests NONE  Final   Gram Stain   Final  FEW WBC PRESENT, PREDOMINANTLY MONONUCLEAR RARE SQUAMOUS EPITHELIAL CELLS PRESENT FEW GRAM POSITIVE COCCI IN PAIRS IN CLUSTERS MODERATE YEAST Performed at [facility]    Culture   Final    Non-Pathogenic Oropharyngeal-type Flora Isolated. Performed at [facility]    Report Status 06/07/2014 FINAL  Final  CSF culture     Status: None   Collection Time: 06/07/14  3:00 PM  Result Value Ref Range Status   Specimen Description CSF  Final   Special Requests Normal  Final   Gram Stain   Final    WBC PRESENT, PREDOMINANTLY MONONUCLEAR NO ORGANISMS SEEN CYTOSPIN Performed at [facility] Performed at [facility]    Culture   Final    NO GROWTH 3 DAYS Performed at [facility]    Report Status 06/10/2014 FINAL  Final  Gram stain - STAT with CSF culture     Status: None   Collection Time: 06/07/14   3:00 PM  Result Value Ref Range Status   Specimen Description CSF  Final   Special Requests Normal  Final   Gram Stain   Final    CYTOSPIN PREP WBC PRESENT, PREDOMINANTLY MONONUCLEAR NO ORGANISMS SEEN    Report Status 06/07/2014 FINAL  Final  MRSA PCR Screening     Status: None   Collection Time: 06/07/14  9:51 PM  Result Value Ref Range Status   MRSA by PCR NEGATIVE NEGATIVE Final    Comment:        The GeneXpert MRSA Assay (FDA approved for NASAL specimens only), is one component of a comprehensive MRSA colonization surveillance program. It is not intended to diagnose MRSA infection nor to guide or monitor treatment for MRSA infections.   Cath Tip Culture     Status: None   Collection Time: 06/10/14  2:13 PM  Result Value Ref Range Status   Specimen Description CATH TIP  Final   Special Requests FROM LEFT IJ  Final   Culture   Final    NO GROWTH 2 DAYS Performed at [facility]    Report Status 06/13/2014 FINAL  Final  Culture, blood (routine x 2)     Status: None (Preliminary result)   Collection Time: 06/13/14  3:20 PM  Result Value Ref Range Status   Specimen Description BLOOD RIGHT ARM  Final   Special Requests BOTTLES DRAWN AEROBIC ONLY 1 CC  Final   Culture   Final           BLOOD CULTURE RECEIVED NO GROWTH TO DATE CULTURE WILL BE HELD FOR 5 DAYS BEFORE ISSUING A FINAL NEGATIVE REPORT Performed at [facility]    Report Status PENDING  Incomplete  Culture, blood (routine x 2)     Status: None (Preliminary result)   Collection Time: 06/13/14  3:43 PM  Result Value Ref Range Status   Specimen Description BLOOD RIGHT FOREARM  Final   Special Requests BOTTLES DRAWN AEROBIC ONLY 1.5 CC  Final   Culture   Final           BLOOD CULTURE RECEIVED NO GROWTH TO DATE CULTURE WILL BE HELD FOR 5 DAYS BEFORE ISSUING A FINAL NEGATIVE REPORT Performed at [facility]    Report Status PENDING  Incomplete  Culture, Urine     Status: None    Collection Time: 06/13/14  7:52 PM  Result Value Ref Range Status   Specimen Description URINE, CATHETERIZED  Final   Special Requests Normal  Final   Colony Count NO GROWTH [facility]   Final   Culture NO GROWTH [facility]   Final   Report Status 06/14/2014 FINAL  Final    Anti-infectives    Start     Dose/Rate Route Frequency Ordered Stop   06/14/14 0400  vancomycin (VANCOCIN) IVPB 1000 mg/200 mL premix  Status:  Discontinued     1,000 mg200 mL/hr over 60 Minutes Intravenous Every 12 hours 06/13/14 1432 06/16/14 0444   06/13/14 1500  piperacillin-tazobactam (ZOSYN) IVPB 3.375 g     3.375 g12.5 mL/hr over 240 Minutes Intravenous Every 8 hours 06/13/14 1432 06/17/14 2359   06/13/14 1445  vancomycin (VANCOCIN) 1,250 mg in sodium chloride 0.9 % 250 mL IVPB     1,250 mg166.7 mL/hr over 90 Minutes Intravenous  Once 06/13/14 1432 06/13/14 1658   06/07/14 1400  metroNIDAZOLE (FLAGYL) IVPB 500 mg  Status:  Discontinued     500 mg100 mL/hr over 60 Minutes Intravenous Every 8 hours 06/07/14 1035 06/09/14 1040   06/06/14 1500  cefTRIAXone (ROCEPHIN) 1 g in dextrose 5 % 50 mL IVPB - Premix     1 g100 mL/hr over 30 Minutes Intravenous Every 24 hours 06/06/14 0926 06/10/14 1529   06/04/14 2300  metroNIDAZOLE (FLAGYL) IVPB 500 mg  Status:  Discontinued     500 mg100 mL/hr over 60 Minutes Intravenous Every 8 hours 06/04/14 1524 06/07/14 0656   06/04/14 1500  ceFEPIme (MAXIPIME) 1 g in dextrose 5 % 50 mL IVPB  Status:  Discontinued     1 g100 mL/hr over 30 Minutes Intravenous Every 24 hours 06/04/14 1454 06/06/14 0913   06/04/14 1130  vancomycin (VANCOCIN) 1,500 mg in sodium chloride 0.9 % 500 mL IVPB     1,500 mg250 mL/hr over 120 Minutes Intravenous  Once 06/04/14 1124 06/04/14 1448   06/04/14 1130  ceFEPIme (MAXIPIME) 1 g in dextrose 5 % 50 mL IVPB  Status:  Discontinued     1 g100 mL/hr over 30 Minutes Intravenous  Once 06/04/14 1124 06/04/14 1546    06/04/14 1130  metroNIDAZOLE (FLAGYL) IVPB 500 mg     500 mg100 mL/hr over 60 Minutes Intravenous  Once 06/04/14 1124 06/04/14 1617      Assessment: 81yo male supratherapeutic on vancomycin with significant increase in SCr (0.59-->1.08-->1.21-->1.41).  Goal of Therapy:  Vancomycin trough level 15-20 mcg/ml  Plan:  Will obtain level 12hr after last to attempt kinetics though if renal function continues to change will be difficult to determine actual dose requirement.  Vernard GamblesVeronda Lakeita Panther, PharmD, BCPS  06/16/2014,4:45 AM

## 2014-06-16 NOTE — Progress Notes (Signed)
06/16/14  Pharmacy-  Vancomycin 1830   Vanc Random 20.7 (drawn 23h after last dose)  A/P:  81yo on Vancomycin for pneumonia with increasing Cr and supratherapeutic trough this AM.  Doses were held and random level this afternoon shows nearly to goal trough of 15-20.  Calculate half-life ~18hr, potential for this to be inaccurate if Cr has continued to increase.  Pt was previously on 1000mg  IV q12, with last dose given at 5PM on 1/29.    1-  Resume Vancomycin 1000mg  IV q24 2-  Continue to watch renal fxn and adjust dosing if needed  Marisue HumbleKendra Adaira Centola, PharmD Clinical Pharmacist [facility]

## 2014-06-16 NOTE — Progress Notes (Signed)
Patient placed on bipap, diminished breath sounds and desaturation. Patient is responding well to treatment.  RT will continue to monitor.

## 2014-06-16 NOTE — Progress Notes (Signed)
Notified MD (Dr. Joseph ArtWoods) IV team unable to get peripheral IV in.  Dr. Joseph ArtWoods advised PICC to stay in.

## 2014-06-16 NOTE — Progress Notes (Signed)
ANTICOAGULATION CONSULT NOTE - FOLLOW-UP  Pharmacy Consult:  Heparin Indication:  LUE DVT  Allergies  Allergen Reactions  . Darvocet [Propoxyphene N-Acetaminophen] Nausea And Vomiting  . Penicillins Nausea And Vomiting    Patient Measurements: Weight: 156 lb 8.4 oz (71 kg)  Heparin dosing weight = 75 kg  Vital Signs: Temp: 98.2 F (36.8 C) (01/30 1259) Temp Source: Oral (01/30 1259) BP: 159/74 mmHg (01/30 1259) Pulse Rate: 96 (01/30 1100)  Labs:  Recent Labs  06/14/14 1100 06/14/14 1114 06/15/14 0325 06/15/14 2100 06/16/14 0323  HGB 9.4*  --  9.4* 9.4* 9.9*  HCT 27.8*  --  28.7* 28.4* 30.3*  PLT 260  --  299 335 372  HEPARINUNFRC  --  0.37 0.51  --  0.49  CREATININE 1.08  --  1.21  --  1.41*    Estimated Creatinine Clearance: 42.5 mL/min (by C-G formula based on Cr of 1.41).   Assessment: [age] YOM with new left axillary brachial veins DVT to continue on IV heparin.  Heparin level therapeutic. No overt bleeding documented although patient had a recent GIB.   Goal of Therapy:  Heparin level 0.3-0.7 units/ml Monitor platelets by anticoagulation protocol: Yes  Vanc trough: 15-20 mcg/mL    Plan:  - Continue heparin gtt at 1,100 units/hr - Daily CBC / HL - F/U PO anticoagulation  Megan E. Supple, Pharm.D Clinical Pharmacy Resident Pager: [phone] 06/16/2014 3:06 PM

## 2014-06-17 ENCOUNTER — Inpatient Hospital Stay (HOSPITAL_COMMUNITY): Payer: Medicare Other

## 2014-06-17 DIAGNOSIS — A419 Sepsis, unspecified organism: Secondary | ICD-10-CM | POA: Insufficient documentation

## 2014-06-17 DIAGNOSIS — R6520 Severe sepsis without septic shock: Secondary | ICD-10-CM

## 2014-06-17 DIAGNOSIS — R0602 Shortness of breath: Secondary | ICD-10-CM | POA: Insufficient documentation

## 2014-06-17 LAB — CBC WITH DIFFERENTIAL/PLATELET
Basophils Absolute: 0 10*3/uL (ref 0.0–0.1)
Basophils Relative: 0 % (ref 0–1)
EOS ABS: 0 10*3/uL (ref 0.0–0.7)
Eosinophils Relative: 0 % (ref 0–5)
HEMATOCRIT: 27.6 % — AB (ref 39.0–52.0)
HEMOGLOBIN: 8.8 g/dL — AB (ref 13.0–17.0)
LYMPHS PCT: 11 % — AB (ref 12–46)
Lymphs Abs: 1.1 10*3/uL (ref 0.7–4.0)
MCH: 30.3 pg (ref 26.0–34.0)
MCHC: 31.9 g/dL (ref 30.0–36.0)
MCV: 95.2 fL (ref 78.0–100.0)
MONO ABS: 0.8 10*3/uL (ref 0.1–1.0)
Monocytes Relative: 8 % (ref 3–12)
NEUTROS ABS: 7.9 10*3/uL — AB (ref 1.7–7.7)
NEUTROS PCT: 81 % — AB (ref 43–77)
Platelets: 361 10*3/uL (ref 150–400)
RBC: 2.9 MIL/uL — ABNORMAL LOW (ref 4.22–5.81)
RDW: 15.2 % (ref 11.5–15.5)
WBC: 9.8 10*3/uL (ref 4.0–10.5)

## 2014-06-17 LAB — GLUCOSE, CAPILLARY
GLUCOSE-CAPILLARY: 118 mg/dL — AB (ref 70–99)
GLUCOSE-CAPILLARY: 134 mg/dL — AB (ref 70–99)
GLUCOSE-CAPILLARY: 137 mg/dL — AB (ref 70–99)
GLUCOSE-CAPILLARY: 91 mg/dL (ref 70–99)
Glucose-Capillary: 113 mg/dL — ABNORMAL HIGH (ref 70–99)
Glucose-Capillary: 139 mg/dL — ABNORMAL HIGH (ref 70–99)

## 2014-06-17 LAB — COMPREHENSIVE METABOLIC PANEL WITH GFR
ALT: 24 U/L (ref 0–53)
AST: 21 U/L (ref 0–37)
Albumin: 2.2 g/dL — ABNORMAL LOW (ref 3.5–5.2)
Alkaline Phosphatase: 59 U/L (ref 39–117)
Anion gap: 10 (ref 5–15)
BUN: 25 mg/dL — ABNORMAL HIGH (ref 6–23)
CO2: 25 mmol/L (ref 19–32)
Calcium: 9 mg/dL (ref 8.4–10.5)
Chloride: 109 mmol/L (ref 96–112)
Creatinine, Ser: 1.38 mg/dL — ABNORMAL HIGH (ref 0.50–1.35)
GFR calc Af Amer: 55 mL/min — ABNORMAL LOW (ref >90)
GFR calc non Af Amer: 47 mL/min — ABNORMAL LOW (ref >90)
Glucose, Bld: 117 mg/dL — ABNORMAL HIGH (ref 70–99)
Potassium: 3.3 mmol/L — ABNORMAL LOW (ref 3.5–5.1)
Sodium: 144 mmol/L (ref 135–145)
Total Bilirubin: 1.2 mg/dL (ref 0.3–1.2)
Total Protein: 4.7 g/dL — ABNORMAL LOW (ref 6.0–8.3)

## 2014-06-17 LAB — MAGNESIUM
Magnesium: 1.9 mg/dL (ref 1.5–2.5)
Magnesium: 3.2 mg/dL — ABNORMAL HIGH (ref 1.5–2.5)

## 2014-06-17 LAB — TROPONIN I
Troponin I: 0.06 ng/mL — ABNORMAL HIGH (ref <0.031)
Troponin I: 0.05 ng/mL — ABNORMAL HIGH (ref <0.031)

## 2014-06-17 LAB — HEPARIN LEVEL (UNFRACTIONATED): Heparin Unfractionated: 0.53 [IU]/mL (ref 0.30–0.70)

## 2014-06-17 LAB — POTASSIUM: Potassium: 4.7 mmol/L (ref 3.5–5.1)

## 2014-06-17 MED ORDER — JEVITY 1.2 CAL PO LIQD
1000.0000 mL | ORAL | Status: DC
  Filled 2014-06-17 (×4): qty 1000

## 2014-06-17 MED ORDER — POTASSIUM CHLORIDE 10 MEQ/100ML IV SOLN
10.0000 meq | INTRAVENOUS | Status: AC
  Administered 2014-06-17 (×3): 10 meq via INTRAVENOUS
  Filled 2014-06-17 (×2): qty 100

## 2014-06-17 MED ORDER — POTASSIUM CL IN DEXTROSE 5% 20 MEQ/L IV SOLN
20.0000 meq | INTRAVENOUS | Status: DC
  Administered 2014-06-17: 20 meq via INTRAVENOUS
  Filled 2014-06-17 (×2): qty 1000

## 2014-06-17 MED ORDER — PRO-STAT SUGAR FREE PO LIQD
30.0000 mL | Freq: Every morning | ORAL | Status: DC
  Filled 2014-06-17 (×2): qty 30

## 2014-06-17 MED ORDER — MAGNESIUM SULFATE 50 % IJ SOLN
3.0000 g | Freq: Once | INTRAVENOUS | Status: AC
  Administered 2014-06-17: 3 g via INTRAVENOUS
  Filled 2014-06-17: qty 6

## 2014-06-17 NOTE — Progress Notes (Signed)
[facility] TEAM 1 - Stepdown/ICU TEAM Progress Note  Marvin Leon CWU:[id number] DOB: November 11, 1932 DOA: 06/04/2014 PCP: Marvin Seller, MD  Admit HPI / Brief Narrative: 82 year old WM PMHx arthritis, GERD, HLD, HTN, CAD s/p 3 stents, emphysema, PAD , DMType 2 on daily ASA and plavix since 02/22/14 since DES iliac stent placement 02/2014, but no NSAID use who presents to the hospital with hematemesis and AMS. In the ED the patient was noted to not be protecting his airway and was intubated for airway protection. OGT was placed with 1500 ml of coffee ground black material was noted. PCCM was called to admit to the ICU. No recent etoh history. Normal cscope and endoscope 2009.   HPI/Subjective: 1/31 A/O 1 (unsure of where, when, why). Does know that he is in the hospital. Negative CP, negative SOB. Overnight patient dropped his SPO2 into the 80s and had to be placed on BiPAP per nursing therefore unable to have NG tube placed.  Assessment/Plan: GI bleed/Acute blood loss anemia on Chronic Anemia - baseline hgb ~ 12-13, -H/H stable -LA class D esophagitis - identified source of the bleeding per EGD report.  -Continue Protonix 40 mg BID, needs daily indefinitely  -Continue plavix 75 mg daily, monitor closely for new bleeding   Severe sepsis /Aspiration pneumonia/COPD- heavy smoking history -Continue on nasal cannula; maintain SPO2 89% to 93% -BiPAP as needed -Flutter valve  -Continue Vanc and Zosyn  -Continue Brovana, Pulmicort, Duonebs -ADDENDUM; increased work of breathing required patient to go onto BiPAP: Obtain PCXR pulmonary edema?  Severe dysphasia -1/30 patient failed modified barium swallow. Will remain NPO -Could retest in 72 hours, however not likely to pass -NG tube placement -Consult dietitian for NG tube feedings -Will contact family in a.m. and discuss possibility of requiring PEG tube in the future  Sinus Tachycardia - with occasional wide complex tachy -Most  likely multifactorial to include blood loss, sepsis, shock and NSTEMI with peak Tp 1.5- resolving  -Currently in NSR   Severe systolic CHF EF 38% on ECHO 06/06/14. ?Takotsubo appearance - Continue Coreg 25 mg BID after NG tube placed -Continue enalapril 10 mg BID after NG tube placed -Continue Metoprolol 2.5 mg IV PRN HR >125  Troponin elevation  -Most likely likely 2/2 to demand/shock; high of 1.43 -Remain high but trending down   DVT in LUE -Continue heparin gtt -Remove PICC line due to DVT if able to obtain peripheral IV. -1/31 IV team unsuccessful in placing peripheral access, will need to continue using PICC line.  Acute on Chronic Renal Failure  - baseline sr cr ~ 1.4,  -At baseline   Hypomagnesemia -Magnesium goal>2  -Magnesium IV 3 gm1  -Recheck at 1300  Hypokalemia  -Potassium goal >4 -Potassium IV 10 mEq 3 -Recheck potassium at 1300  Hypophosphatemia  -Check in a.m.  Hypernatremia -Resolved  Nutrition -Start D5W +K 20 mEq 70m/hr  Plan of care  -Met with Marvin Leon and her 2 daughters and reviewed the patient's current medical status. Counseled family that with CHF, COPD, and pneumonia patient stood approximately 10-15% of ability of survival. Counseled family that patient was requiring BiPAP off and on during the day which was preventing u[place] placing NG tube in order to provide nutrition. Counseled that if patient was going to have a chance we would need to be able to provide nutrition; asked if they would want to put patient through PEG placement if IR or surgery agreed to place. Family stated they would like  to have a family meeting tonight prior to making decision. -Also explained to family the difference between DO NOT RESUSCITATE and full code,Marvin Leon and her 2 daughters requested that patient be made DO NOT RESUSCITATE.    Code Status:DNR Family Communication: no family present at time of exam Disposition Plan: CIR    Consultants: Dr.  Carol Ada [facility])    Procedure/Significant Events: 1/18 NGT placed lavaged with 1500 ml coffee grounds, no active bleeding. 1/18 CT - mild atrophy.  1/18 KUB - normal. 1/18 UGI bleed. hgb dropped. 1/19 EGD, weaned off pressor. 1/19 EGD; LA class D esophagitis. this is the source of the bleeding. 1/20 echocardiogram; EF 25%. Diffuse hypokinesis. No classic Takotsubo's, but function worse towards apex. No thrombi.  1/20 Failed extubation. Off pressors 1/22 Agitation overnight, on precedex / fentanyl, low grade fevers 1/22 MRI Brain- small 52m acute infarct in left occipital lobe- likely not cause for mental status changes 1/22 EEG- slow with triphasic waves, consistent with patient's condition. No seizures. 1/25 Hbg 7.7, transfused 1 unit PRBCs 1/25 LUE Doppler +DVT, started on heparin gtt 1/26 Acute anisocoria noted, CT head neg. Ophtho eval- Adies pupil vs. physiologic 1/26 CT Head, no acute infarct 1/27 CXR- Increased bilateral opacities, concern for PNA. Increased bilateral pleural effusions.  1/27 Hypoxia in 80s, placed back on BiPAP  Culture BCx2 1/18 >> negative UC 1/18 >> negative Sputum 1/18 >> negative  CSF 1/21 >> negative L IJ tip culture 1/24>> negative  Blood cx 1/27 >>  Antibiotics: Cefepime 1/18 >>1/20 Flagyl 1/18 >> 1/23 Ceftriaxone 1/20 >> 1/24  Vanc 1/27 >> Zosyn 1/27>>   DVT prophylaxis: Heparin per pharmacy   Devices NA   LINES / TUBES:  L IJ TLC 1/18 >>1/24 PICC 1/24>>      Continuous Infusions: . sodium chloride 20 mL/hr at 06/16/14 1100  . dexmedetomidine Stopped (06/11/14 1000)  . heparin 1,100 Units/hr (06/16/14 1100)    Objective: VITAL SIGNS: Temp: 97.9 F (36.6 C) (01/31 0744) Temp Source: Axillary (01/31 0744) BP: 156/71 mmHg (01/31 0744) Pulse Rate: 88 (01/31 0505) SPO2; FIO2:   Intake/Output Summary (Last 24 hours) at 06/17/14 0903 Last data filed at 06/17/14 0742  Gross per 24 hour  Intake   1342 ml   Output    850 ml  Net    492 ml     Exam: General: A/O 1 (unsure of where, when, why). Does know that he is in the hospital, No acute respiratory distress Lungs: Clear to auscultation bilaterally without wheezes or crackles Cardiovascular: Regular rate and rhythm without murmur gallop or rub normal S1 and S2 Abdomen: Nontender, nondistended, soft, bowel sounds positive, no rebound, no ascites, no appreciable mass Extremities: No significant cyanosis, clubbing, or edema bilateral lower extremities  Data Reviewed: Basic Metabolic Panel:  Recent Labs Lab 06/11/14 0420  06/14/14 0500 06/14/14 1100 06/15/14 0325 06/16/14 0323 06/17/14 0230  NA 149*  < > 138 145 146* 143 144  K 3.5  < > 3.6 3.3* 3.7 3.7 3.3*  CL 118*  < > 112 106 108 107 109  CO2 23  < > _0 GLUCOSE 214*  < > 97 162* 129* 128* 117*  BUN 38*  < > 13 27* 25* 23 25*  CREATININE 1.06  < > 0.59 1.08 1.21 1.41* 1.38*  CALCIUM 8.1*  < > 8.0* 8.4 8.9 9.1 9.0  MG 1.9  --   --   --   --  1.9 1.9  PHOS 2.8  --   --   --   --  4.3  --   < > = values in this interval not displayed. Liver Function Tests:  Recent Labs Lab 06/16/14 0323 06/17/14 0230  AST 22 21  ALT 26 24  ALKPHOS 65 59  BILITOT 1.4* 1.2  PROT 5.4* 4.7*  ALBUMIN 2.4* 2.2*   No results for input(s): LIPASE, AMYLASE in the last 168 hours. No results for input(s): AMMONIA in the last 168 hours. CBC:  Recent Labs Lab 06/14/14 1100 06/15/14 0325 06/15/14 2100 06/16/14 0323 06/17/14 0230  WBC 14.8* 14.0* 13.6* 12.6* 9.8  NEUTROABS  --   --  11.5*  --  7.9*  HGB 9.4* 9.4* 9.4* 9.9* 8.8*  HCT 27.8* 28.7* 28.4* 30.3* 27.6*  MCV 91.4 94.4 95.9 94.1 95.2  PLT 260 299 335 372 361   Cardiac Enzymes:  Recent Labs Lab 06/16/14 2030 06/17/14 0230  TROPONINI 0.05* 0.06*   BNP (last 3 results) No results for input(s): PROBNP in the last 8760 hours. CBG:  Recent Labs Lab 06/16/14 1601 06/16/14 1912 06/17/14 0023 06/17/14 0423  06/17/14 0741  GLUCAP 117* 117* 137* 91 113*    Recent Results (from the past 240 hour(s))  CSF culture     Status: None   Collection Time: 06/07/14  3:00 PM  Result Value Ref Range Status   Specimen Description CSF  Final   Special Requests Normal  Final   Gram Stain   Final    WBC PRESENT, PREDOMINANTLY MONONUCLEAR NO ORGANISMS SEEN CYTOSPIN Performed at [facility] Performed at [facility]   Final    NO GROWTH 3 DAYS Performed at [facility]    Report Status 06/10/2014 FINAL  Final  Gram stain - STAT with CSF culture     Status: None   Collection Time: 06/07/14  3:00 PM  Result Value Ref Range Status   Specimen Description CSF  Final   Special Requests Normal  Final   Gram Stain   Final    CYTOSPIN PREP WBC PRESENT, PREDOMINANTLY MONONUCLEAR NO ORGANISMS SEEN    Report Status 06/07/2014 FINAL  Final  MRSA PCR Screening     Status: None   Collection Time: 06/07/14  9:51 PM  Result Value Ref Range Status   MRSA by PCR NEGATIVE NEGATIVE Final    Comment:        The GeneXpert MRSA Assay (FDA approved for NASAL specimens only), is one component of a comprehensive MRSA colonization surveillance program. It is not intended to diagnose MRSA infection nor to guide or monitor treatment for MRSA infections.   Cath Tip Culture     Status: None   Collection Time: 06/10/14  2:13 PM  Result Value Ref Range Status   Specimen Description CATH TIP  Final   Special Requests FROM LEFT IJ  Final   Culture   Final    NO GROWTH 2 DAYS Performed at [facility]    Report Status 06/13/2014 FINAL  Final  Culture, blood (routine x 2)     Status: None (Preliminary result)   Collection Time: 06/13/14  3:20 PM  Result Value Ref Range Status   Specimen Description BLOOD RIGHT ARM  Final   Special Requests BOTTLES DRAWN AEROBIC ONLY 1 CC  Final   Culture   Final           BLOOD CULTURE RECEIVED NO GROWTH TO  DATE CULTURE WILL BE HELD FOR  5 DAYS BEFORE ISSUING A FINAL NEGATIVE REPORT Performed at [facility]    Report Status PENDING  Incomplete  Culture, blood (routine x 2)     Status: None (Preliminary result)   Collection Time: 06/13/14  3:43 PM  Result Value Ref Range Status   Specimen Description BLOOD RIGHT FOREARM  Final   Special Requests BOTTLES DRAWN AEROBIC ONLY 1.5 CC  Final   Culture   Final           BLOOD CULTURE RECEIVED NO GROWTH TO DATE CULTURE WILL BE HELD FOR 5 DAYS BEFORE ISSUING A FINAL NEGATIVE REPORT Performed at [facility]    Report Status PENDING  Incomplete  Culture, Urine     Status: None   Collection Time: 06/13/14  7:52 PM  Result Value Ref Range Status   Specimen Description URINE, CATHETERIZED  Final   Special Requests Normal  Final   Colony Count NO GROWTH Performed at [facility]   Final   Culture NO GROWTH Performed at [facility]   Final   Report Status 06/14/2014 FINAL  Final     Studies:  Recent x-ray studies have been reviewed in detail by the Attending Physician  Scheduled Meds:  Scheduled Meds: . antiseptic oral rinse  7 mL Mouth Rinse q12n4p  . arformoterol  15 mcg Nebulization BID  . budesonide (PULMICORT) nebulizer solution  0.5 mg Nebulization BID  . carvedilol  25 mg Oral BID WC  . chlorhexidine  15 mL Mouth Rinse BID  . clopidogrel  75 mg Oral Daily  . docusate  100 mg Per Tube Daily  . enalapril  10 mg Oral BID  . insulin aspart  2-6 Units Subcutaneous 6 times per day  . magnesium sulfate 1 - 4 g bolus IVPB  3 g Intravenous Once  . pantoprazole (PROTONIX) IV  40 mg Intravenous Q12H  . piperacillin-tazobactam (ZOSYN)  IV  3.375 g Intravenous Q8H  . sodium chloride  10-40 mL Intracatheter Q12H  . vancomycin  1,000 mg Intravenous Q24H    Time spent on care of this patient: 40 mins   Allie Bossier [facility]  [facility] Office  [phone] Pager - [phone]  On-Call/Text Page:      [email or web address]       password [email or web address]  If 7PM-7AM, please contact night-coverage [email or web address] Password [email or web address] 06/17/2014, 9:03 AM   LOS: 13 days

## 2014-06-17 NOTE — Progress Notes (Signed)
Attempted to wean pt off of bi-pap.  Pt off bi-pap no longer than 15 before resp rate increased into the high 30's, pulse rate in the 120's. Pt using abd muscle to breathe.  Pt stated he felt like he could not breathe.  Resp Therapy notified. Pt placed back on bi-pap at this time.

## 2014-06-17 NOTE — Progress Notes (Signed)
Dr. Joseph ArtWoods was notified that the pt's picc line had blood around the antimicrobial disc and there was bruising at the insertion site of the picc.    Caralee AtesJason Ariza Evans, RN

## 2014-06-17 NOTE — Progress Notes (Signed)
ANTICOAGULATION CONSULT NOTE - FOLLOW-UP  Pharmacy Consult:  Heparin Indication:  LUE DVT  Allergies  Allergen Reactions  . Darvocet [Propoxyphene N-Acetaminophen] Nausea And Vomiting  . Penicillins Nausea And Vomiting    Patient Measurements: Weight: 156 lb 12 oz (71.1 kg)  Heparin dosing weight = 75 kg  Vital Signs: Temp: 97.9 F (36.6 C) (01/31 0744) Temp Source: Axillary (01/31 0744) BP: 156/71 mmHg (01/31 0744) Pulse Rate: 88 (01/31 0505)  Labs:  Recent Labs  06/15/14 0325 06/15/14 2100 06/16/14 0323 06/16/14 2030 06/17/14 0230 06/17/14 0302  HGB 9.4* 9.4* 9.9*  --  8.8*  --   HCT 28.7* 28.4* 30.3*  --  27.6*  --   PLT 299 335 372  --  361  --   HEPARINUNFRC 0.51  --  0.49  --   --  0.53  CREATININE 1.21  --  1.41*  --  1.38*  --   TROPONINI  --   --   --  0.05* 0.06*  --     Estimated Creatinine Clearance: 43.4 mL/min (by C-G formula based on Cr of 1.38).   Assessment: [age] YOM with new left axillary brachial veins DVT to continue on IV heparin.  Heparin level therapeutic. No overt bleeding documented although patient had a recent GIB.  Goal of Therapy:  Heparin level 0.3-0.7 units/ml Monitor platelets by anticoagulation protocol: Yes   Plan:  - Continue heparin at 1,100 units/hr - Daily CBC / HL - F/U PO anticoagulation plans  Megan E. Supple, Pharm.D Clinical Pharmacy Resident Pager: [phone] 06/17/2014 7:52 AM

## 2014-06-17 NOTE — Progress Notes (Signed)
Brief Nutrition Note  Consult received for enteral/tube feeding initiation and management.  Adult Enteral Nutrition Protocol initiated. Full assessment to follow.  Admitting Dx: Acute upper GI bleeding [K92.2] SOB (shortness of breath) [R06.02] S/P PICC central line placement [Z98.89] Acute respiratory failure with hypoxia [J96.01] Transient hypotension [R03.1] Severe sepsis [A41.9, R65.20] Acute renal failure, unspecified acute renal failure type [N17.9]  Body mass index is 23.14 kg/(m^2). Pt meets criteria for normal range based on current BMI.  Labs:   Recent Labs Lab 06/11/14 0420  06/15/14 0325 06/16/14 0323 06/17/14 0230  NA 149*  < > 146* 143 144  K 3.5  < > 3.7 3.7 3.3*  CL 118*  < > 108 107 109  CO2 23  < > 30 25 25   BUN 38*  < > 25* 23 25*  CREATININE 1.06  < > 1.21 1.41* 1.38*  CALCIUM 8.1*  < > 8.9 9.1 9.0  MG 1.9  --   --  1.9 1.9  PHOS 2.8  --   --  4.3  --   GLUCOSE 214*  < > 129* 128* 117*  < > = values in this interval not displayed.  Tilda FrancoLindsey Akiem Urieta, MS, RD, LDN Pager: [phone] After Hours Pager: [phone]

## 2014-06-17 NOTE — Progress Notes (Signed)
D/c foley per protocol. No current accepted indication for foley. Last IV lasix was on 1/27.condom cath applied.     Caralee AtesJason Leah Thornberry, RN

## 2014-06-18 LAB — COMPREHENSIVE METABOLIC PANEL
ALT: 29 U/L (ref 0–53)
ANION GAP: 7 (ref 5–15)
AST: 29 U/L (ref 0–37)
Albumin: 2.2 g/dL — ABNORMAL LOW (ref 3.5–5.2)
Alkaline Phosphatase: 57 U/L (ref 39–117)
BUN: 20 mg/dL (ref 6–23)
CALCIUM: 8.9 mg/dL (ref 8.4–10.5)
CO2: 28 mmol/L (ref 19–32)
Chloride: 109 mmol/L (ref 96–112)
Creatinine, Ser: 1.22 mg/dL (ref 0.50–1.35)
GFR calc Af Amer: 63 mL/min — ABNORMAL LOW (ref >90)
GFR, EST NON AFRICAN AMERICAN: 55 mL/min — AB (ref >90)
Glucose, Bld: 131 mg/dL — ABNORMAL HIGH (ref 70–99)
Potassium: 3.3 mmol/L — ABNORMAL LOW (ref 3.5–5.1)
SODIUM: 144 mmol/L (ref 135–145)
TOTAL PROTEIN: 5 g/dL — AB (ref 6.0–8.3)
Total Bilirubin: 1.1 mg/dL (ref 0.3–1.2)

## 2014-06-18 LAB — GLUCOSE, CAPILLARY
GLUCOSE-CAPILLARY: 114 mg/dL — AB (ref 70–99)
GLUCOSE-CAPILLARY: 122 mg/dL — AB (ref 70–99)
GLUCOSE-CAPILLARY: 147 mg/dL — AB (ref 70–99)
Glucose-Capillary: 119 mg/dL — ABNORMAL HIGH (ref 70–99)
Glucose-Capillary: 142 mg/dL — ABNORMAL HIGH (ref 70–99)

## 2014-06-18 LAB — CBC WITH DIFFERENTIAL/PLATELET
BASOS ABS: 0 10*3/uL (ref 0.0–0.1)
BASOS PCT: 0 % (ref 0–1)
Eosinophils Absolute: 0.1 10*3/uL (ref 0.0–0.7)
Eosinophils Relative: 1 % (ref 0–5)
HCT: 27.7 % — ABNORMAL LOW (ref 39.0–52.0)
Hemoglobin: 9 g/dL — ABNORMAL LOW (ref 13.0–17.0)
LYMPHS ABS: 1 10*3/uL (ref 0.7–4.0)
Lymphocytes Relative: 10 % — ABNORMAL LOW (ref 12–46)
MCH: 30.9 pg (ref 26.0–34.0)
MCHC: 32.5 g/dL (ref 30.0–36.0)
MCV: 95.2 fL (ref 78.0–100.0)
MONO ABS: 0.9 10*3/uL (ref 0.1–1.0)
Monocytes Relative: 9 % (ref 3–12)
NEUTROS ABS: 8 10*3/uL — AB (ref 1.7–7.7)
Neutrophils Relative %: 80 % — ABNORMAL HIGH (ref 43–77)
Platelets: 384 10*3/uL (ref 150–400)
RBC: 2.91 MIL/uL — ABNORMAL LOW (ref 4.22–5.81)
RDW: 15.3 % (ref 11.5–15.5)
WBC: 9.8 10*3/uL (ref 4.0–10.5)

## 2014-06-18 LAB — HEPARIN LEVEL (UNFRACTIONATED): Heparin Unfractionated: 0.64 IU/mL (ref 0.30–0.70)

## 2014-06-18 LAB — MAGNESIUM: Magnesium: 2.2 mg/dL (ref 1.5–2.5)

## 2014-06-18 MED ORDER — MORPHINE SULFATE 2 MG/ML IJ SOLN
1.0000 mg | INTRAMUSCULAR | Status: DC | PRN
  Administered 2014-06-18: 2 mg via INTRAVENOUS
  Administered 2014-06-19 (×2): 1 mg via INTRAVENOUS
  Administered 2014-06-20 – 2014-06-21 (×5): 2 mg via INTRAVENOUS
  Filled 2014-06-18 (×8): qty 1

## 2014-06-18 MED ORDER — SODIUM CHLORIDE 0.9 % IV SOLN
INTRAVENOUS | Status: DC
  Administered 2014-06-19: 17:00:00 via INTRAVENOUS

## 2014-06-18 MED ORDER — DOCUSATE SODIUM 50 MG/5ML PO LIQD
100.0000 mg | Freq: Two times a day (BID) | ORAL | Status: DC | PRN
  Filled 2014-06-18: qty 10

## 2014-06-18 MED ORDER — LORAZEPAM 2 MG/ML IJ SOLN
0.5000 mg | INTRAMUSCULAR | Status: DC | PRN
  Administered 2014-06-20: 1 mg via INTRAVENOUS
  Filled 2014-06-18: qty 1

## 2014-06-18 MED ORDER — METOPROLOL TARTRATE 1 MG/ML IV SOLN
5.0000 mg | INTRAVENOUS | Status: DC | PRN
  Filled 2014-06-18: qty 5

## 2014-06-18 MED ORDER — CETYLPYRIDINIUM CHLORIDE 0.05 % MT LIQD: 7.0000 mL | OROMUCOSAL | Status: DC | PRN

## 2014-06-18 NOTE — Progress Notes (Signed)
NUTRITION FOLLOW UP/CONSULT  DOCUMENTATION CODES Per approved criteria  -Non-severe (moderate) malnutrition in the context of chronic illness    Intervention:   If NGT is replaced, Start Jevity 1.2 at 25 ml/h via NGT and increase bvy 10 ml/hr every 4 hours to goal rate of 65 ml/h.  30 ml Pro-stat once daily. TF regimen to provide 1972 kcals, 102 gm protein, 1264 ml free water daily.  Diet advancement as able per SLP.  RD to continue to monitor   Nutrition Dx:   Inadequate oral intake related to inability to eat as evidenced by NPO status, ongoing.  Goal:   Intake to meet >90% of estimated nutrition needs; unmet  Monitor:   Diet advancement, PO intake, labs, weight trend.  Assessment:   Patient presented to the hospital on 1/18 with hematemesis and AMS. Intubated in the ED. EGD on 1/19 showed esophagitis.  RD consulted for TF initiation and management. Patient was extubated on 1/25. NG tube was placed over the weekend but, pt pulled NGT out over the weekend per family report. Pt resting comforatbly on venturi mask at time of visit. He remains NPO. His weight has dropped 7 lbs in the past 4 days.  Per OT note "Pt with respiratory issues and family considering comfort care. Nursing asked to defer for now." RD to continue to monitor for goal of care and nutrition needs.   Height: Ht Readings from Last 1 Encounters:  03/20/14 5\' 9"  (1.753 m)    Weight Status:   Wt Readings from Last 1 Encounters:  06/18/14 153 lb (69.4 kg)  06/14/14 160 lb  Re-estimated needs:  Kcal: 1900-2100 Protein: 90-105 gm Fluid: 1.9-2.1 L  Skin: no wounds  Diet Order:  NPO   Intake/Output Summary (Last 24 hours) at 06/18/14 1031 Last data filed at 06/18/14 0600  Gross per 24 hour  Intake  917.5 ml  Output    600 ml  Net  317.5 ml    Last BM: 1/31   Labs:   Recent Labs Lab 06/16/14 0323 06/17/14 0230 06/17/14 1325 06/18/14 0510  NA 143 144  --  144  K 3.7 3.3* 4.7 3.3*  CL  107 109  --  109  CO2 25 25  --  28  BUN 23 25*  --  20  CREATININE 1.41* 1.38*  --  1.22  CALCIUM 9.1 9.0  --  8.9  MG 1.9 1.9 3.2* 2.2  PHOS 4.3  --   --   --   GLUCOSE 128* 117*  --  131*    CBG (last 3)   Recent Labs  06/17/14 2040 06/18/14 0005 06/18/14 0434  GLUCAP 134* 147* 114*    Scheduled Meds: . antiseptic oral rinse  7 mL Mouth Rinse q12n4p  . arformoterol  15 mcg Nebulization BID  . budesonide (PULMICORT) nebulizer solution  0.5 mg Nebulization BID  . carvedilol  25 mg Oral BID WC  . chlorhexidine  15 mL Mouth Rinse BID  . clopidogrel  75 mg Oral Daily  . docusate  100 mg Per Tube Daily  . enalapril  10 mg Oral BID  . feeding supplement (PRO-STAT SUGAR FREE 64)  30 mL Oral q morning - 10a  . insulin aspart  2-6 Units Subcutaneous 6 times per day  . pantoprazole (PROTONIX) IV  40 mg Intravenous Q12H  . sodium chloride  10-40 mL Intracatheter Q12H  . vancomycin  1,000 mg Intravenous Q24H    Continuous Infusions: . dexmedetomidine Stopped (  06/11/14 1000)  . dextrose 5 % with KCl 20 mEq / L 20 mEq (06/18/14 0400)  . feeding supplement (JEVITY 1.2 CAL)    . heparin 1,100 Units/hr (06/18/14 0400)   Ian Malkin RD, LDN Inpatient Clinical Dietitian Pager: [phone] After Hours Pager: [phone]

## 2014-06-18 NOTE — Progress Notes (Signed)
Subjective: Events since admission noted. As per my discussion with Dr. Thereasa Leon, the family has decided to make the patient "comfort care only" and have him moved to palliative care. I met with his wife and 4 children and spent about 40 minutes with them discussing end of life issues.    Objective: Vital signs in last 24 hours: Temp:  [97.7 F (36.5 C)-98.5 F (36.9 C)] 98.4 F (36.9 C) (02/01 1221) Pulse Rate:  [79-98] 92 (02/01 1221) Resp:  [16-27] 21 (02/01 1221) BP: (146-166)/(68-104) 146/80 mmHg (02/01 1221) SpO2:  [95 %-100 %] 100 % (02/01 1221) FiO2 (%):  [30 %-40 %] 35 % (02/01 0831) Weight:  [69.4 kg (153 lb)] 69.4 kg (153 lb) (02/01 0312) Last BM Date: 06/17/14  Intake/Output from previous day: 01/31 0701 - 02/01 0700 In: 1027.5 [I.V.:677.5; IV Piggyback:350] Out: 600 [Urine:600] Intake/Output this shift: Total I/O In: 205 [I.V.:205] Out: 500 [Urine:500]  General appearance: cooperative, fatigued, no distress, pale and slowed mentation Resp: clear to auscultation bilaterally Cardio: regular rate and rhythm, S1, S2 normal, no murmur, click, rub or gallop GI: soft, non-tender; bowel sounds normal; no masses,  no organomegaly Extremities: extremities normal, atraumatic, no cyanosis or edema  Lab Results:  Recent Labs  06/16/14 0323 06/17/14 0230 06/18/14 0510  WBC 12.6* 9.8 9.8  HGB 9.9* 8.8* 9.0*  HCT 30.3* 27.6* 27.7*  PLT 372 361 384   BMET  Recent Labs  06/16/14 0323 06/17/14 0230 06/17/14 1325 06/18/14 0510  NA 143 144  --  144  K 3.7 3.3* 4.7 3.3*  CL 107 109  --  109  CO2 25 25  --  28  GLUCOSE 128* 117*  --  131*  BUN 23 25*  --  20  CREATININE 1.41* 1.38*  --  1.22  CALCIUM 9.1 9.0  --  8.9   LFT  Recent Labs  06/18/14 0510  PROT 5.0*  ALBUMIN 2.2*  AST 29  ALT 29  ALKPHOS 57  BILITOT 1.1   Studies/Results: Dg Chest Port 1 View  06/17/2014   CLINICAL DATA:  SOB. Hx HTN, CAD, and diabetes.  EXAM: PORTABLE CHEST - 1 VIEW   COMPARISON:  06/13/2014  FINDINGS: There is hazy bilateral mid and lower lung zone opacity, with some slight clearing in the upper lungs when compared to the prior exam. No pneumothorax.  Cardiac silhouette is normal size configuration.  Left PICC shows a changed position. The tip now curls projecting in the superior vena cava near its confluence with the left brachiocephalic vein.  IMPRESSION: 1. Slight improvement with relative clearing in the upper lungs with her the prior study. There is persistent hazy airspace opacity in the mid and lower lungs likely combination of pleural effusions with parenchymal infiltrates. 2. PICC tip now appears curl in the mid superior vena cava represented change from the prior exam. It appears mildly retracted from the prior exam. 3. No other change.   Electronically Signed   By: Lajean Manes M.D.   On: 06/17/2014 15:31   Medications: I have reviewed the patient's current medications.  Assessment/Plan: Feeding difficulties/malnutrition in addituion to multiple co-morbidities: Family does NOT desire a PEG tube for Marvin Leon. Please call as needed.  LOS: 14 days   Marvin Leon 06/18/2014, 1:12 PM

## 2014-06-18 NOTE — Progress Notes (Signed)
ANTICOAGULATION CONSULT NOTE - FOLLOW-UP  Pharmacy Consult:  Heparin Indication:  LUE DVT  Allergies  Allergen Reactions  . Darvocet [Propoxyphene N-Acetaminophen] Nausea And Vomiting  . Penicillins Nausea And Vomiting    Patient Measurements: Weight: 153 lb (69.4 kg)  Heparin dosing weight = 75 kg  Vital Signs: Temp: 98.5 F (36.9 C) (02/01 0737) Temp Source: Axillary (02/01 0737) BP: 160/76 mmHg (02/01 0737) Pulse Rate: 79 (02/01 1100)  Labs:  Recent Labs  06/16/14 0323 06/16/14 2030 06/17/14 0230 06/17/14 0302 06/17/14 0857 06/18/14 0510  HGB 9.9*  --  8.8*  --   --  9.0*  HCT 30.3*  --  27.6*  --   --  27.7*  PLT 372  --  361  --   --  384  HEPARINUNFRC 0.49  --   --  0.53  --  0.64  CREATININE 1.41*  --  1.38*  --   --  1.22  TROPONINI  --  0.05* 0.06*  --  0.05*  --     Estimated Creatinine Clearance: 48.2 mL/min (by C-G formula based on Cr of 1.22).   Assessment: [age] YOM with new left axillary brachial veins DVT to continue on IV heparin.  Heparin level therapeutic. No overt bleeding documented although patient had a recent GIB. IV team unsuccessful in placing peripheral access on 1/31 so still using PICC line with DVT.  May have PEG placed so not able to start oral anticoagulation yet.  Would need to be off plavix 5-7 days (still on plavix).  Family considering comfort care.   Goal of Therapy:  Heparin level 0.3-0.7 units/ml Monitor platelets by anticoagulation protocol: Yes   Plan:  - Continue heparin at 1,100 units/hr - Daily CBC / HL - F/U PO anticoagulation plans once appropriate  Herby AbrahamMichelle T. Abdulaziz Toman, Pharm.D. [phone] 06/18/2014 11:15 AM

## 2014-06-18 NOTE — Progress Notes (Signed)
PT Cancellation Note  Patient Details Name: Burnett ShengDonald G Zeoli MRN: [id number] DOB: 12-30-1932   Cancelled Treatment:    Reason Eval/Treat Not Completed: Medical issues which prohibited therapy (Pt with respiratory issues.) Will follow up tomorrow.   Byrl Latin 06/18/2014, 8:49 AM  Skip Mayerary Durand Wittmeyer PT [phone]

## 2014-06-18 NOTE — Progress Notes (Signed)
Speech Language Pathology Treatment: Dysphagia  Patient Details Name: Marvin Leon Kosmicki MRN: [id number] DOB: 1933-04-20 Today's Date: 06/18/2014 Time: 1040-1050 SLP Time Calculation (min) (ACUTE ONLY): 10 min  Assessment / Plan / Recommendation Clinical Impression  At time of treatment, pt tolerating nasal cannula well, respiration WNL. SLP provided one ice chip with delayed swallow followed by immediate cough and wet vocal quality. Pt unable to expectorate adequately despite cues and then complained of nausea. RN aware. Pt making no gains toward safe PO consumption at this time. Recommend he remain NPO. SLP will f/u after family meeting.    HPI HPI: 81 year old male hx of arthritis, GERD, HLD, HTN, CAD s/p 3 stents, emphysema, PAD, admitted with UGI bleed. NGT lavaged. Noted to be agitated overnight 1/22. 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 Intubated 1/18-1/25 for respiratory failure. Also with esophagitis and Aspiration pneumonia - improving, but then new infiltrates on CXR 1/27.    Pertinent Vitals    SLP Plan  Continue with current plan of care    Recommendations Diet recommendations: NPO              Plan: Continue with current plan of care    GO    [facility] IncBonnie Kortne All, MA CCC-SLP [phone]  Claudine MoutonDeBlois, Ulonda Klosowski Caroline 06/18/2014, 11:35 AM

## 2014-06-18 NOTE — Progress Notes (Signed)
Noted issues as outlined. I will follow. [phone]

## 2014-06-18 NOTE — Progress Notes (Signed)
IR PA aware of request for percutaneous pull through G-tube, patient's respiratory status still requiring Bipap and this would need to improve so that an OG can be placed during procedure and that the patient can safely be sedated. The patient will also need to be off plavix 5-7 days and have repeat CT imaging to evaluate anatomy prior to placement. This has been discussed today with Dr. Miles CostainShick.  Pattricia BossKoreen Krosby Ritchie PA-C Interventional Radiology  06/18/14  9:20 AM

## 2014-06-18 NOTE — Progress Notes (Signed)
[place] TEAM 1 - Stepdown/ICU TEAM Progress Note  Marvin Leon ZOX:[id number] DOB: [birth year] DOA: 06/04/2014 PCP: [name], MD  Admit HPI / Brief Narrative: 81 year old M Hx arthritis, GERD, HLD, HTN, CAD s/p 3 stents (ASA + Plavix), emphysema, PAD, DM2, iliac stent placement 02/2014, who presented to the hospital with hematemesis and AMS. In the ED the patient was noted to not be protecting his airway and was intubated for airway protection. OGT was placed with 1500 ml of coffee ground black material noted. PCCM was called to admit to the ICU.   HPI/Subjective: I had a lengthy discussion with the patient, his wife, and his 4 children.  We discussed his current status, and his failure to make significant improvements over the last few days.  The family feels strongly that they do not wish to put him through further tests, blood draws, or interventions of any kind.  The patient agrees.  I have entered orders to stop all medications or treatments not directly tied to comfort.  The family is aware that presently I am unsure as to his life expectancy, and that there is even a small chance he could make a late rally and begin to improve.  In such, we will cont to monitor him as an inpatient for now.    Assessment/Plan:  GI bleed / Acute blood loss anemia on Chronic Anemia - baseline hgb ~ 12-13 -LA class D esophagitis - identified source of the bleeding per EGD report  -Continue Protonix 40 mg BID as comfort measure  -not safe to consider NG feeds due to high risk of inciting recurrent bleeding, or event tear/puncture   Severe sepsis / RML and RLL Aspiration pneumonia / COPD - heavy smoking history -resp status appears comfortable at this time   Severe dysphasia -1/30 patient failed modified barium swallow - liberalize diet for comfort sake - family aware of aspiration risk   Sinus Tachycardia - occasional wide complex tachy -Most likely multifactorial to include blood loss,  sepsis, and shock   -Currently in NSR   Severe systolic CHF EF 25% on ECHO 06/06/14 -etiology unclear - pt and family informed there is a small chance this could improve  Troponin elevation  -Most likely likely 2/2 to demand/shock; high of 1.43   DVT in LUE -keep PICC for IV pain and anxiety meds   Acute on Chronic Renal Failure  -baseline cr ~ 1.4  -At baseline   Hypomagnesemia  Hypokalemia   Hypophosphatemia   Hypernatremia -Resolved  Code Status:DNR - comfort focused care  Family Communication: spoke w/ multiple family members at length, as detailed above  Disposition Plan: SDU - transfer to [facility] bed in AM if does not acutely decline over night   Consultants: Dr. Jeani Hawking [facility])  Procedure/Significant Events: 1/18 NGT placed lavaged with 1500 ml coffee grounds, no active bleeding. 1/18 CT - mild atrophy.  1/18 KUB - normal. 1/18 UGI bleed. hgb dropped. 1/19 EGD, weaned off pressor. 1/19 EGD; LA class D esophagitis. this is the source of the bleeding. 1/20 echocardiogram; EF 25%. Diffuse hypokinesis. No classic Takotsubo's, but function worse towards apex. No thrombi.  1/20 Failed extubation. Off pressors 1/22 Agitation overnight, on precedex / fentanyl, low grade fevers 1/22 MRI Brain- small 5mm acute infarct in left occipital lobe- likely not cause for mental status changes 1/22 EEG- slow with triphasic waves, consistent with patient's condition. No seizures. 1/25 Hbg 7.7, transfused 1 unit PRBCs 1/25 LUE Doppler +DVT, started  on heparin gtt 1/26 Acute anisocoria noted, CT head neg. Ophtho eval - Adies pupil vs. physiologic 1/26 CT Head, no acute infarct 1/27 CXR- Increased bilateral opacities, concern for PNA. Increased bilateral pleural effusions.  1/27 Hypoxia in 80s, placed back on BiPAP  Antibiotics: Cefepime 1/18 >>1/20 Flagyl 1/18 >> 1/23 Ceftriaxone 1/20 >> 1/24  Vanc 1/27 > 2/1 Zosyn 1/27> 2/1  DVT prophylaxis: Comfort only    Objective: Blood pressure 146/80, pulse 92, temperature 98.4 F (36.9 C), temperature source Axillary, resp. rate 21, weight 69.4 kg (153 lb), SpO2 100 %.  Intake/Output Summary (Last 24 hours) at 06/18/14 1340 Last data filed at 06/18/14 1222  Gross per 24 hour  Intake 1122.5 ml  Output   1100 ml  Net   22.5 ml   Exam: General: No acute respiratory distress Lungs: Course bibasilar crackles - no wheeze  Cardiovascular: Regular rate without murmur gallop or rub normal S1 and S2 Abdomen: Nontender, nondistended, soft, bowel sounds positive, no rebound, no ascites, no appreciable mass Extremities: No significant cyanosis, clubbing, or edema bilateral lower extremities  Data Reviewed: Basic Metabolic Panel:  Recent Labs Lab 06/14/14 1100 06/15/14 0325 06/16/14 0323 06/17/14 0230 06/17/14 1325 06/18/14 0510  NA 145 146* 143 144  --  144  K 3.3* 3.7 3.7 3.3* 4.7 3.3*  CL 106 108 107 109  --  109  CO2 28 30 25 25   --  28  GLUCOSE 162* 129* 128* 117*  --  131*  BUN 27* 25* 23 25*  --  20  CREATININE 1.08 1.21 1.41* 1.38*  --  1.22  CALCIUM 8.4 8.9 9.1 9.0  --  8.9  MG  --   --  1.9 1.9 3.2* 2.2  PHOS  --   --  4.3  --   --   --    Liver Function Tests:  Recent Labs Lab 06/16/14 0323 06/17/14 0230 06/18/14 0510  AST 22 21 29   ALT 26 24 29   ALKPHOS 65 59 57  BILITOT 1.4* 1.2 1.1  PROT 5.4* 4.7* 5.0*  ALBUMIN 2.4* 2.2* 2.2*   CBC:  Recent Labs Lab 06/15/14 0325 06/15/14 2100 06/16/14 0323 06/17/14 0230 06/18/14 0510  WBC 14.0* 13.6* 12.6* 9.8 9.8  NEUTROABS  --  11.5*  --  7.9* 8.0*  HGB 9.4* 9.4* 9.9* 8.8* 9.0*  HCT 28.7* 28.4* 30.3* 27.6* 27.7*  MCV 94.4 95.9 94.1 95.2 95.2  PLT 299 335 372 361 384   Cardiac Enzymes:  Recent Labs Lab 06/16/14 2030 06/17/14 0230 06/17/14 0857  TROPONINI 0.05* 0.06* 0.05*   CBG:  Recent Labs Lab 06/17/14 1603 06/17/14 2040 06/18/14 0005 06/18/14 0434 06/18/14 1218  GLUCAP 118* 134* 147* 114* 142*     Recent Results (from the past 240 hour(s))  Cath Tip Culture     Status: None   Collection Time: 06/10/14  2:13 PM  Result Value Ref Range Status   Specimen Description CATH TIP  Final   Special Requests FROM LEFT IJ  Final   Culture   Final    NO GROWTH 2 DAYS [facility] at [facility]    Report Status 06/13/2014 FINAL  Final  Culture, blood (routine x 2)     Status: None (Preliminary result)   Collection Time: 06/13/14  3:20 PM  Result Value Ref Range Status   Specimen Description BLOOD RIGHT ARM  Final   Special Requests BOTTLES DRAWN AEROBIC ONLY 1 CC  Final   Culture  Final           BLOOD CULTURE RECEIVED NO GROWTH TO DATE CULTURE WILL BE HELD FOR 5 DAYS BEFORE ISSUING A FINAL NEGATIVE REPORT Performed at [facility]    Report Status PENDING  Incomplete  Culture, blood (routine x 2)     Status: None (Preliminary result)   Collection Time: 06/13/14  3:43 PM  Result Value Ref Range Status   Specimen Description BLOOD RIGHT FOREARM  Final   Special Requests BOTTLES DRAWN AEROBIC ONLY 1.5 CC  Final   Culture   Final           BLOOD CULTURE RECEIVED NO GROWTH TO DATE CULTURE WILL BE HELD FOR 5 DAYS BEFORE ISSUING A FINAL NEGATIVE REPORT Performed at [facility]    Report Status PENDING  Incomplete  Culture, Urine     Status: None   Collection Time: 06/13/14  7:52 PM  Result Value Ref Range Status   Specimen Description URINE, CATHETERIZED  Final   Special Requests Normal  Final   Colony Count NO GROWTH Performed at [facility]   Final   Culture NO GROWTH Performed at [facility]   Final   Report Status 06/14/2014 FINAL  Final     Studies:  Recent x-ray studies have been reviewed in detail by the Attending Physician  Scheduled Meds:  Scheduled Meds: . antiseptic oral rinse  7 mL Mouth Rinse q12n4p  . arformoterol  15 mcg Nebulization BID  . budesonide (PULMICORT) nebulizer solution  0.5 mg Nebulization BID  .  carvedilol  25 mg Oral BID WC  . chlorhexidine  15 mL Mouth Rinse BID  . clopidogrel  75 mg Oral Daily  . docusate  100 mg Per Tube Daily  . enalapril  10 mg Oral BID  . feeding supplement (PRO-STAT SUGAR FREE 64)  30 mL Oral q morning - 10a  . insulin aspart  2-6 Units Subcutaneous 6 times per day  . pantoprazole (PROTONIX) IV  40 mg Intravenous Q12H  . sodium chloride  10-40 mL Intracatheter Q12H  . vancomycin  1,000 mg Intravenous Q24H    Time spent on care of this patient: 35 mins  Lonia Blood, MD [facility] For Consults/Admissions - Flow Manager - [phone] Office  [phone]  Contact MD directly via text page:      [email or web address]      password [facility]  06/18/2014, 1:40 PM   LOS: 14 days

## 2014-06-18 NOTE — Progress Notes (Signed)
Attempted to see pt this am.  Pt with respiratory issues and family considering comfort care.  Nursing asked to defer for now.  Will attempt back as schedule allows. Tory EmeraldHolly Oluwadamilola Deliz, [place] [phone]

## 2014-06-18 NOTE — Progress Notes (Signed)
Placed patient on Venti mask 40%.  He was agitated  On BiPAp and pulling at mask.  RR-21, SpO2- 97, and HR-92.

## 2014-06-19 DIAGNOSIS — K922 Gastrointestinal hemorrhage, unspecified: Secondary | ICD-10-CM | POA: Insufficient documentation

## 2014-06-19 DIAGNOSIS — Z9889 Other specified postprocedural states: Secondary | ICD-10-CM

## 2014-06-19 DIAGNOSIS — Z95828 Presence of other vascular implants and grafts: Secondary | ICD-10-CM | POA: Insufficient documentation

## 2014-06-19 LAB — CULTURE, BLOOD (ROUTINE X 2)
CULTURE: NO GROWTH
Culture: NO GROWTH

## 2014-06-19 MED ORDER — RESOURCE THICKENUP CLEAR PO POWD
ORAL | Status: DC | PRN
Start: 2014-06-19 — End: 2014-06-21
  Filled 2014-06-19: qty 125

## 2014-06-19 MED ORDER — PANTOPRAZOLE SODIUM 40 MG PO TBEC
40.0000 mg | DELAYED_RELEASE_TABLET | Freq: Two times a day (BID) | ORAL | Status: DC
  Administered 2014-06-19: 40 mg via ORAL
  Filled 2014-06-19: qty 1

## 2014-06-19 NOTE — Progress Notes (Signed)
Called report to [employer]  RN on [address]

## 2014-06-19 NOTE — Clinical Social Work Note (Signed)
CSW will continue to follow in case patient will need placement at time of DC- pt transferred to 6N- report given to unit CSW.  Merlyn LotJenna Holoman, LCSWA Clinical Social Worker [phone]

## 2014-06-19 NOTE — Progress Notes (Signed)
Noted goals of care We will sign off at this time. [phone]

## 2014-06-19 NOTE — Progress Notes (Signed)
[facility] TEAM 1 - Stepdown/ICU TEAM Progress Note  Marvin Leon MVH:[id number] DOB: March 17, 1933 DOA: 06/04/2014 PCP: [name], MD  Admit HPI / Brief Narrative: 81 year old WM PMHx arthritis, GERD, HLD, HTN, CAD s/p 3 stents, emphysema, PAD , DMType 2 on daily ASA and plavix since 02/22/14 since DES iliac stent placement 02/2014, but no NSAID use who presents to the hospital with hematemesis and AMS. In the ED the patient was noted to not be protecting his airway and was intubated for airway protection. OGT was placed with 1500 ml of coffee ground black material was noted. PCCM was called to admit to the ICU. No recent etoh history. Normal cscope and endoscope 2009.   HPI/Subjective: 2/1 A/O 1 (unsure of where, when, why). However does recognize his family Negative CP, negative SOB.    Assessment/Plan: GI bleed/Acute blood loss anemia on Chronic Anemia - baseline hgb ~ 12-13, -H/H stable -LA class D esophagitis - identified source of the bleeding per EGD report.  -Continue Protonix 40 mg BID, needs daily indefinitely  -Continue plavix 75 mg daily, monitor closely for new bleeding  -not safe to consider NG feeds due to high risk of inciting recurrent bleeding, or event tear/puncture   Severe sepsis /Aspiration pneumonia/COPD- heavy smoking history -Patient made comfort care by family on 2/1 -resp status appears comfortable at this time   Severe dysphasia -1/30 patient failed modified barium swallow.  - liberalize diet for comfort sake  - family aware of aspiration risk   Sinus Tachycardia - with occasional wide complex tachy -Most likely multifactorial to include blood loss, sepsis, shock and NSTEMI with peak Tp 1.5- resolving  -Currently in NSR   Severe systolic CHF EF 25% on ECHO 06/06/14. ?Takotsubo appearance - Continue metoprolol IV 5 mg q 5 min PRN patient comfort -etiology unclear,  pt and family informed there is a small chance this could  improve  Troponin elevation  -Most likely likely 2/2 to demand/shock; high of 1.43 -Remain high but trending down   DVT in LUE -Continue heparin gtt -Maintain PICC line for IV pain and anxiety meds   Acute on Chronic Renal Failure  - baseline sr cr ~ 1.4,  -At baseline   Hypomagnesemia -Labs have been discontinued; comfort care   Hypokalemia  -Labs have been discontinued; comfort care   Hypophosphatemia  -Labs have been discontinued; comfort care   Hypernatremia -Labs have been discontinued; comfort care   Nutrition -Regular diet for patient's comfort   Plan of care  -comfort care     Code Status:DNR Family Communication: no family present at time of exam Disposition Plan: CIR    Consultants: Dr. Jeani Hawking [facility])    Procedure/Significant Events: 1/18 NGT placed lavaged with 1500 ml coffee grounds, no active bleeding. 1/18 CT - mild atrophy.  1/18 KUB - normal. 1/18 UGI bleed. hgb dropped. 1/19 EGD, weaned off pressor. 1/19 EGD; LA class D esophagitis. this is the source of the bleeding. 1/20 echocardiogram; EF 25%. Diffuse hypokinesis. No classic Takotsubo's, but function worse towards apex. No thrombi.  1/20 Failed extubation. Off pressors 1/22 Agitation overnight, on precedex / fentanyl, low grade fevers 1/22 MRI Brain- small 5mm acute infarct in left occipital lobe- likely not cause for mental status changes 1/22 EEG- slow with triphasic waves, consistent with patient's condition. No seizures. 1/25 Hbg 7.7, transfused 1 unit PRBCs 1/25 LUE Doppler +DVT, started on heparin gtt 1/26 Acute anisocoria noted, CT head neg. Ophtho eval- Adies pupil  vs. physiologic 1/26 CT Head, no acute infarct 1/27 CXR- Increased bilateral opacities, concern for PNA. Increased bilateral pleural effusions.  1/27 Hypoxia in 80s, placed back on BiPAP  Culture BCx2 1/18 >> negative UC 1/18 >> negative Sputum 1/18 >> negative  CSF 1/21 >> negative L IJ tip  culture 1/24>> negative  Blood cx 1/27 >>  Antibiotics: Cefepime 1/18 >>1/20 Flagyl 1/18 >> 1/23 Ceftriaxone 1/20 >> 1/24  Vanc 1/27 >> stopped 2/1 Zosyn 1/27>> stopped 2/1   DVT prophylaxis: Heparin per pharmacy   Devices NA   LINES / TUBES:  L IJ TLC 1/18 >>1/24 PICC 1/24>>      Continuous Infusions: . sodium chloride 10 mL/hr (06/18/14 1600)    Objective: VITAL SIGNS: Temp: 98.9 F (37.2 C) (02/02 0415) Temp Source: Oral (02/02 0415) BP: 158/116 mmHg (02/02 0600) Pulse Rate: 88 (02/02 0600) SPO2; FIO2:   Intake/Output Summary (Last 24 hours) at 06/19/14 0858 Last data filed at 06/19/14 0656  Gross per 24 hour  Intake    315 ml  Output   1000 ml  Net   -685 ml     Exam: General: A/O 1 (unsure of where, when, why). Does know that he is in the hospital, No acute respiratory distress Lungs: Clear to auscultation bilaterally without wheezes or crackles Cardiovascular: Regular rate and rhythm without murmur gallop or rub normal S1 and S2 Abdomen: Nontender, nondistended, soft, bowel sounds positive, no rebound, no ascites, no appreciable mass Extremities: No significant cyanosis, clubbing, or edema bilateral lower extremities  Data Reviewed: Basic Metabolic Panel:  Recent Labs Lab 06/14/14 1100 06/15/14 0325 06/16/14 0323 06/17/14 0230 06/17/14 1325 06/18/14 0510  NA 145 146* 143 144  --  144  K 3.3* 3.7 3.7 3.3* 4.7 3.3*  CL 106 108 107 109  --  109  CO2 --  28  GLUCOSE 162* 129* 128* 117*  --  131*  BUN 27* 25* 23 25*  --  20  CREATININE 1.08 1.21 1.41* 1.38*  --  1.22  CALCIUM 8.4 8.9 9.1 9.0  --  8.9  MG  --   --  1.9 1.9 3.2* 2.2  PHOS  --   --  4.3  --   --   --    Liver Function Tests:  Recent Labs Lab 06/16/14 0323 06/17/14 0230 06/18/14 0510  AST ALT ALKPHOS 65 59 57  BILITOT 1.4* 1.2 1.1  PROT 5.4* 4.7* 5.0*  ALBUMIN 2.4* 2.2* 2.2*   No results for input(s): LIPASE, AMYLASE in the last  168 hours. No results for input(s): AMMONIA in the last 168 hours. CBC:  Recent Labs Lab 06/15/14 0325 06/15/14 2100 06/16/14 0323 06/17/14 0230 06/18/14 0510  WBC 14.0* 13.6* 12.6* 9.8 9.8  NEUTROABS  --  11.5*  --  7.9* 8.0*  HGB 9.4* 9.4* 9.9* 8.8* 9.0*  HCT 28.7* 28.4* 30.3* 27.6* 27.7*  MCV 94.4 95.9 94.1 95.2 95.2  PLT 299 335 372 361 384   Cardiac Enzymes:  Recent Labs Lab 06/16/14 2030 06/17/14 0230 06/17/14 0857  TROPONINI 0.05* 0.06* 0.05*   BNP (last 3 results) No results for input(s): PROBNP in the last 8760 hours. CBG:  Recent Labs Lab 06/18/14 0005 06/18/14 0434 06/18/14 0735 06/18/14 1218 06/18/14 1629  GLUCAP 147* 114* 119* 142* 122*    Recent Results (from the past 240 hour(s))  Cath Tip Culture     Status: None  Collection Time: 06/10/14  2:13 PM  Result Value Ref Range Status   Specimen Description CATH TIP  Final   Special Requests FROM LEFT IJ  Final   Culture   Final    NO GROWTH 2 DAYS [facility]formed at [facility]    Report Status 06/13/2014 FINAL  Final  Culture, blood (routine x 2)     Status: None   Collection Time: 06/13/14  3:20 PM  Result Value Ref Range Status   Specimen Description BLOOD RIGHT ARM  Final   Special Requests BOTTLES DRAWN AEROBIC ONLY 1 CC  Final   Culture   Final    NO GROWTH 5 DAYS Performed at [facility]    Report Status 06/19/2014 FINAL  Final  Culture, blood (routine x 2)     Status: None   Collection Time: 06/13/14  3:43 PM  Result Value Ref Range Status   Specimen Description BLOOD RIGHT FOREARM  Final   Special Requests BOTTLES DRAWN AEROBIC ONLY 1.5 CC  Final   Culture   Final    NO GROWTH 5 DAYS Performed at [facility]    Report Status 06/19/2014 FINAL  Final  Culture, Urine     Status: None   Collection Time: 06/13/14  7:52 PM  Result Value Ref Range Status   Specimen Description URINE, CATHETERIZED  Final   Special Requests Normal  Final   Colony Count NO  GROWTH Performed at [facility]   Final   Culture NO GROWTH Performed at [facility]   Final   Report Status 06/14/2014 FINAL  Final     Studies:  Recent x-ray studies have been reviewed in detail by the Attending Physician  Scheduled Meds:  Scheduled Meds: . pantoprazole (PROTONIX) IV  40 mg Intravenous Q12H    Time spent on care of this patient: 40 mins   Drema DallasWOODS, Ruthia Person, J , [facility]  [facility] Office  [phone] Pager - [phone]  On-Call/Text Page:      [email or web address]      password [email or web address]  If 7PM-7AM, please contact night-coverage [email or web address] Password [email or web address] 06/19/2014, 8:58 AM   LOS: 15 days

## 2014-06-19 NOTE — Progress Notes (Signed)
Pt transferred to 6 North bed 12 per bed with all belongings per Nurse Tech. Family aware of transfer.

## 2014-06-19 NOTE — Progress Notes (Signed)
Nutrition Brief Note  Chart reviewed. Pt now transitioning to comfort care.  No further nutrition interventions warranted at this time.  Please re-consult as needed.   Tricia [place] A. Yida Hyams, RD, LDN, CDE Pager: [phone] After hours Pager: [phone]  

## 2014-06-19 NOTE — Progress Notes (Signed)
Patient transferred from [facility]. Report received from [place], [place].

## 2014-06-20 MED ORDER — PANTOPRAZOLE SODIUM 40 MG PO PACK
40.0000 mg | PACK | Freq: Two times a day (BID) | ORAL | Status: DC
  Administered 2014-06-20 – 2014-06-21 (×3): 40 mg via ORAL
  Filled 2014-06-20 (×4): qty 20

## 2014-06-20 NOTE — Clinical Social Work Note (Addendum)
Physician recommended patient have residential hospice contacted [facility] based on patient's address was in [facility] [place] [facility], however they are in [place] and would prefer to go to residential hospice at [facility].  Met with family and discussed the process of having placement at residential hospice.  Patient's wife, daughter, and son, met with CSW and are in agreement to going to hospice.  Patient's wife was tearful, but expressed her husband has gone through a lot and he would want a peaceful and painless death.  Patient's wife was relieved to hear that once he moves to residential hospice he would not be moved again.  [place] and left a message for facility to call back in the morning.  Patient's family expressed gratitude for information and explanation of the process for a patient going to hospice.  CSW to follow up in the morning with hospice home and family to facilitate discharge and transfer to hospice as long as bed is available and patient receives discharge orders.  Jones Broom. [place], MSW, [place] 06/20/2014 6:21 PM

## 2014-06-20 NOTE — Progress Notes (Signed)
[facility] TEAM 1 - Stepdown/ICU TEAM Progress Note  Marvin Leon BJY:[id number] DOB: Sep 19, 1932 DOA: 06/04/2014 PCP: [name], MD  Admit HPI / Brief Narrative: 81 year old M Hx arthritis, GERD, HLD, HTN, CAD s/p 3 stents (ASA + Plavix), emphysema, PAD, DM2, iliac stent placement 02/2014, who presented to the hospital with hematemesis and AMS. In the ED the patient was noted to not be protecting his airway and was intubated for airway protection. OGT was placed with 1500 ml of coffee ground black material noted. PCCM was called to admit to the ICU.   HPI/Subjective: Pt appears to comfortable.  He is able to wake up and deny pain.  Family states that he is eating very little, but has tolerated some intake for comfort sake.    Assessment/Plan:  GI bleed / Acute blood loss anemia on Chronic Anemia - baseline hgb ~ 12-13 -LA class D esophagitis source of the bleeding per EGD  -Continue Protonix 40 mg BID as comfort measure  -not safe to consider NG feeds due to high risk of inciting recurrent bleeding, or event tear/puncture   Severe sepsis / RML and RLL Aspiration pneumonia / COPD - heavy smoking history -resp status appears comfortable at this time   Severe dysphasia -1/30 patient failed modified barium swallow - liberalized diet for comfort sake - family aware of aspiration risk and also educated that pt should only be offered food if he expresses interest, such that we will not be trying to encourage/make him eat when he is not hungry   Sinus Tachycardia - occasional wide complex tachy -multifactorial to include blood loss, sepsis, and shock    Newly diagnosed Severe systolic CHF EF 25% on ECHO 06/06/14 -etiology unclear - pt and family informed there is a small chance this could improve spontaneously  Troponin elevation  -Most likely likely 2/2 to demand/shock; high of 1.43   DVT in LUE -keep PICC for IV pain and anxiety meds   Acute on Chronic Renal Failure    -baseline cr ~ 1.4  -At baseline   Hypomagnesemia - not following further as focus is on comfort   Hypokalemia  - not following further as focus is on comfort   Hypophosphatemia  - not following further as focus is on comfort   Hypernatremia - not following further as focus is on comfort   Code Status:DNR - comfort focused care  Family Communication: spoke w/ multiple family members at bedside   Disposition Plan: cont comfort directed care at bedside - does not appear currently that death is imminent, though w/ severity of illness and very minimal intake I suspect his life expectancy is 7-10 days at best - discussed options of home w/ hospice care, SNF w/ hospice care, or [facility] w/ daughter at bedside - have asked Hospice to evaluate for availability/eligibility for [facility]   Consultants: Dr. Jeani Hawking [facility] GI)  Procedure/Significant Events: 1/18 NGT placed lavaged with 1500 ml coffee grounds, no active bleeding. 1/18 CT - mild atrophy.  1/18 KUB - normal. 1/18 UGI bleed. hgb dropped. 1/19 EGD, weaned off pressor. 1/19 EGD; LA class D esophagitis. this is the source of the bleeding. 1/20 echocardiogram; EF 25%. Diffuse hypokinesis. No classic Takotsubo's, but function worse towards apex. No thrombi.  1/20 Failed extubation. Off pressors 1/22 Agitation overnight, on precedex / fentanyl, low grade fevers 1/22 MRI Brain- small 5mm acute infarct in left occipital lobe- likely not cause for mental status changes 1/22 EEG- slow with  triphasic waves, consistent with patient's condition. No seizures. 1/25 Hbg 7.7, transfused 1 unit PRBCs 1/25 LUE Doppler +DVT, started on heparin gtt 1/26 Acute anisocoria noted, CT head neg. Ophtho eval - Adies pupil vs. physiologic 1/26 CT Head, no acute infarct 1/27 CXR- Increased bilateral opacities, concern for PNA. Increased bilateral pleural effusions.  1/27 Hypoxia in 80s, placed back on BiPAP  Antibiotics: Cefepime  1/18 >>1/20 Flagyl 1/18 >> 1/23 Ceftriaxone 1/20 >> 1/24  Vanc 1/27 > 2/1 Zosyn 1/27> 2/1  DVT prophylaxis: Comfort only   Objective: Blood pressure 178/81, pulse 88, temperature 98.8 F (37.1 C), temperature source Axillary, resp. rate 26, weight 69.4 kg (153 lb), SpO2 98 %.  Intake/Output Summary (Last 24 hours) at 06/20/14 1320 Last data filed at 06/20/14 [id number]  Gross per 24 hour  Intake    278 ml  Output   1360 ml  Net  -1082 ml   Exam: General: No acute respiratory distress Lungs: Course bibasilar crackles - no wheeze  Cardiovascular: Regular rate  Extremities: No significant cyanosis, clubbing, or edema bilateral lower extremities  Data Reviewed: Basic Metabolic Panel:  Recent Labs Lab 06/14/14 1100 06/15/14 0325 06/16/14 0323 06/17/14 0230 06/17/14 1325 06/18/14 0510  NA 145 146* 143 144  --  144  K 3.3* 3.7 3.7 3.3* 4.7 3.3*  CL 106 108 107 109  --  109  CO2 28 30 25 25   --  28  GLUCOSE 162* 129* 128* 117*  --  131*  BUN 27* 25* 23 25*  --  20  CREATININE 1.08 1.21 1.41* 1.38*  --  1.22  CALCIUM 8.4 8.9 9.1 9.0  --  8.9  MG  --   --  1.9 1.9 3.2* 2.2  PHOS  --   --  4.3  --   --   --    Liver Function Tests:  Recent Labs Lab 06/16/14 0323 06/17/14 0230 06/18/14 0510  AST 22 21 29   ALT 26 24 29   ALKPHOS 65 59 57  BILITOT 1.4* 1.2 1.1  PROT 5.4* 4.7* 5.0*  ALBUMIN 2.4* 2.2* 2.2*   CBC:  Recent Labs Lab 06/15/14 0325 06/15/14 2100 06/16/14 0323 06/17/14 0230 06/18/14 0510  WBC 14.0* 13.6* 12.6* 9.8 9.8  NEUTROABS  --  11.5*  --  7.9* 8.0*  HGB 9.4* 9.4* 9.9* 8.8* 9.0*  HCT 28.7* 28.4* 30.3* 27.6* 27.7*  MCV 94.4 95.9 94.1 95.2 95.2  PLT 299 335 372 361 384   Cardiac Enzymes:  Recent Labs Lab 06/16/14 2030 06/17/14 0230 06/17/14 0857  TROPONINI 0.05* 0.06* 0.05*   CBG:  Recent Labs Lab 06/18/14 0005 06/18/14 0434 06/18/14 0735 06/18/14 1218 06/18/14 1629  GLUCAP 147* 114* 119* 142* 122*    Recent Results (from the  past 240 hour(s))  Cath Tip Culture     Status: None   Collection Time: 06/10/14  2:13 PM  Result Value Ref Range Status   Specimen Description CATH TIP  Final   Special Requests FROM LEFT IJ  Final   Culture   Final    NO GROWTH 2 DAYS Performed at [facility]    Report Status 06/13/2014 FINAL  Final  Culture, blood (routine x 2)     Status: None   Collection Time: 06/13/14  3:20 PM  Result Value Ref Range Status   Specimen Description BLOOD RIGHT ARM  Final   Special Requests BOTTLES DRAWN AEROBIC ONLY 1 CC  Final   Culture   Final  NO GROWTH 5 DAYS Performed at [facility]    Report Status 06/19/2014 FINAL  Final  Culture, blood (routine x 2)     Status: None   Collection Time: 06/13/14  3:43 PM  Result Value Ref Range Status   Specimen Description BLOOD RIGHT FOREARM  Final   Special Requests BOTTLES DRAWN AEROBIC ONLY 1.5 CC  Final   Culture   Final    NO GROWTH 5 DAYS Performed at [facility]    Report Status 06/19/2014 FINAL  Final  Culture, Urine     Status: None   Collection Time: 06/13/14  7:52 PM  Result Value Ref Range Status   Specimen Description URINE, CATHETERIZED  Final   Special Requests Normal  Final   Colony Count NO GROWTH Performed at [facility]   Final   Culture NO GROWTH Performed at [facility]   Final   Report Status 06/14/2014 FINAL  Final     Studies:  Recent x-ray studies have been reviewed in detail by the Attending Physician  Scheduled Meds:  Scheduled Meds: . pantoprazole sodium  40 mg Oral BID    Time spent on care of this patient: 25 mins  Lonia Blood, MD [facility] For Consults/Admissions - Flow Manager - [phone] Office  [phone]  Contact MD directly via text page:      [email or web address]      password [email or web address]  06/20/2014, 1:20 PM   LOS: 16 days

## 2014-06-21 DIAGNOSIS — D631 Anemia in chronic kidney disease: Secondary | ICD-10-CM

## 2014-06-21 DIAGNOSIS — N189 Chronic kidney disease, unspecified: Secondary | ICD-10-CM

## 2014-06-21 DIAGNOSIS — J438 Other emphysema: Secondary | ICD-10-CM

## 2014-06-21 MED ORDER — PANTOPRAZOLE SODIUM 40 MG PO PACK: 40.0000 mg | PACK | Freq: Two times a day (BID) | ORAL | Status: AC

## 2014-06-21 MED ORDER — METOPROLOL TARTRATE 1 MG/ML IV SOLN: 5.0000 mg | INTRAVENOUS | Status: AC | PRN

## 2014-06-21 MED ORDER — RESOURCE THICKENUP CLEAR PO POWD: 125.0000 g | ORAL | Status: AC | PRN

## 2014-06-21 MED ORDER — HEPARIN SOD (PORK) LOCK FLUSH 100 UNIT/ML IV SOLN
250.0000 [IU] | INTRAVENOUS | Status: AC | PRN
  Administered 2014-06-21: 250 [IU]

## 2014-06-21 MED ORDER — MORPHINE SULFATE 2 MG/ML IJ SOLN: 1.0000 mg | INTRAMUSCULAR | Status: DC | PRN

## 2014-06-21 MED ORDER — IPRATROPIUM-ALBUTEROL 0.5-2.5 (3) MG/3ML IN SOLN: 3.0000 mL | RESPIRATORY_TRACT | Status: AC | PRN

## 2014-06-21 MED ORDER — MORPHINE SULFATE 2 MG/ML IJ SOLN: 1.0000 mg | INTRAMUSCULAR | Status: AC | PRN

## 2014-06-21 MED ORDER — LORAZEPAM 2 MG/ML IJ SOLN: 0.5000 mg | INTRAMUSCULAR | Status: AC | PRN

## 2014-06-21 NOTE — Discharge Summary (Signed)
Physician Discharge Summary  Jsoeph Podesta Makar ZOX:[id number] DOB: [birth year] DOA: 06/04/2014  PCP: [name], MD  Admit date: 06/04/2014 Discharge date: 06/21/2014  Time spent: 40 minutes  Recommendations for Outpatient Follow-up:  GI bleed/Acute blood loss anemia on Chronic Anemia - baseline hgb ~ 12-13, -H/H stable -LA class D esophagitis - identified source of the bleeding per EGD report.  -Continue Protonix 40 mg BID, needs daily indefinitely  -comfort care   Severe sepsis /Aspiration pneumonia/COPD- heavy smoking history -Patient made comfort care by family on 2/1 -resp status appears comfortable at this time   Severe dysphasia -1/30 patient failed modified barium swallow.  - Patient on regular diet for comfort sake  - family aware of aspiration risk   Sinus Tachycardia - with occasional wide complex tachy -Most likely multifactorial to include blood loss, sepsis, shock and NSTEMI with peak Tp 1.5- resolving  -Currently in NSR   Severe systolic CHF EF 25% on ECHO 06/06/14. ?Takotsubo appearance - Continue metoprolol IV 5 mg q 5 min PRN patient comfort  Troponin elevation  -Most likely likely 2/2 to demand/shock; high of 1.43  DVT in LUE -Continue heparin gtt -Maintain PICC line for IV pain and anxiety meds   Acute on Chronic Renal Failure  - baseline sr cr ~ 1.4,  -At baseline   Hypomagnesemia -Labs have been discontinued; comfort care   Hypokalemia  -Labs have been discontinued; comfort care   Hypophosphatemia  -Labs have been discontinued; comfort care   Hypernatremia -Labs have been discontinued; comfort care   Nutrition -Regular diet for patient's comfort   Plan of care  -comfort care     Code Status:DNR Family Communication: no family present at time of exam Disposition Plan: Inpatient hospice facility [facility] [place]).    Discharge Diagnoses:  Principal Problem:   UGIB (upper gastrointestinal bleed) Active  Problems:   PVD (peripheral vascular disease)   CAD (coronary artery disease)   Acute respiratory failure   Tachycardia   Demand ischemia   Acute renal failure syndrome   Acute upper GI bleeding   Encephalopathy acute   Dvt femoral (deep venous thrombosis)   Acute systolic CHF (congestive heart failure)   Debility   Acute blood loss anemia   Aspiration pneumonia   COPD exacerbation   Sinus tachycardia   Chronic systolic heart failure   Elevated troponin   DVT of upper extremity (deep vein thrombosis)   Acute on chronic renal failure   Hypomagnesemia   Hypokalemia   Hypernatremia   Aspiration pneumonia due to food (regurgitated)   Dysphagia   Hypophosphatemia   Severe sepsis   SOB (shortness of breath)   GIB (gastrointestinal bleeding)   S/P PICC central line placement   Discharge Condition: Stable  Diet recommendation: Regular  Filed Weights   06/16/14 0600 06/17/14 0420 06/18/14 0312  Weight: 71 kg (156 lb 8.4 oz) 71.1 kg (156 lb 12 oz) 69.4 kg (153 lb)    History of present illness:  81 year old WM PMHx arthritis, GERD, HLD, HTN, CAD s/p 3 stents, emphysema, PAD , DMType 2 on daily ASA and plavix since 02/22/14 since DES iliac stent placement 02/2014, but no NSAID use who presents to the hospital with hematemesis and AMS. In the ED the patient was noted to not be protecting his airway and was intubated for airway protection. OGT was placed with 1500 ml of coffee ground black material was noted. PCCM was called to admit to the ICU. No recent etoh  history. Normal cscope and endoscope 2009. Patient with multiple serious medical problems. Despite maximum treatment patient's status continues to decline and after a family meeting the family members decided to make patient comfort care. They requested that patient be discharged to inpatient hospice facility [facility] of [place]).   Consultants: Dr. Jeani Hawking [facility])    Procedure/Significant  Events: 1/18 NGT placed lavaged with 1500 ml coffee grounds, no active bleeding. 1/18 CT - mild atrophy.  1/18 KUB - normal. 1/18 UGI bleed. hgb dropped. 1/19 EGD, weaned off pressor. 1/19 EGD; LA class D esophagitis. this is the source of the bleeding. 1/20 echocardiogram; EF 25%. Diffuse hypokinesis. No classic Takotsubo's, but function worse towards apex. No thrombi.  1/20 Failed extubation. Off pressors 1/22 Agitation overnight, on precedex / fentanyl, low grade fevers 1/22 MRI Brain- small 5mm acute infarct in left occipital lobe- likely not cause for mental status changes 1/22 EEG- slow with triphasic waves, consistent with patient's condition. No seizures. 1/25 Hbg 7.7, transfused 1 unit PRBCs 1/25 LUE Doppler +DVT, started on heparin gtt 1/26 Acute anisocoria noted, CT head neg. Ophtho eval- Adies pupil vs. physiologic 1/26 CT Head, no acute infarct 1/27 CXR- Increased bilateral opacities, concern for PNA. Increased bilateral pleural effusions.  1/27 Hypoxia in 80s, placed back on BiPAP  Culture BCx2 1/18 >> negative UC 1/18 >> negative Sputum 1/18 >> negative  CSF 1/21 >> negative L IJ tip culture 1/24>> negative  Blood cx 1/27 >>  Antibiotics: Cefepime 1/18 >>1/20 Flagyl 1/18 >> 1/23 Ceftriaxone 1/20 >> 1/24  Vanc 1/27 >> stopped 2/1 Zosyn 1/27>> stopped 2/1   Discharge Exam: Filed Vitals:   06/19/14 1235 06/19/14 1305 06/20/14 0631 06/21/14 0500  BP: 143/80 141/66 178/81 156/73  Pulse: 96 64 88 98  Temp: 98.2 F (36.8 C) 98.7 F (37.1 C) 98.8 F (37.1 C) 99 F (37.2 C)  TempSrc: Axillary Axillary Axillary Oral  Resp: 17 17 26 22   Weight:      SpO2: 96% 98% 98% 95%    General: A/O 1 (unsure of where, when, why). No acute respiratory distress Lungs: Clear to auscultation bilaterally without wheezes or crackles Cardiovascular: Regular rate and rhythm without murmur gallop or rub normal S1 and S2 Abdomen: Nontender, nondistended, soft, bowel  sounds positive, no rebound, no ascites, no appreciable mass Extremities: No significant cyanosis, clubbing, or edema bilateral lower extremities  Discharge Instructions     Medication List    ASK your doctor about these medications        ALPRAZolam 1 MG tablet  Commonly known as:  XANAX  Take 1 mg by mouth 2 (two) times daily.     aspirin EC 81 MG tablet  Take 81 mg by mouth every morning.     cetirizine 10 MG tablet  Commonly known as:  ZYRTEC  Take 5 mg by mouth daily.     cholecalciferol 400 UNITS Tabs tablet  Commonly known as:  VITAMIN D  Take 400 Units by mouth 2 (two) times daily.     clopidogrel 75 MG tablet  Commonly known as:  PLAVIX  Take 1 tablet (75 mg total) by mouth daily.     dexlansoprazole 60 MG capsule  Commonly known as:  DEXILANT  Take 60 mg by mouth daily before breakfast.     docusate sodium 100 MG capsule  Commonly known as:  COLACE  Take 200 mg by mouth daily.     ezetimibe-simvastatin 10-40 MG per tablet  Commonly known  as:  VYTORIN  Take 1 tablet by mouth 3 (three) times a week. Mondays, Thursdays and Saturdays     Iron 28 MG Tabs  Take 28 mg by mouth once a week. Wednesdays     isosorbide mononitrate 30 MG 24 hr tablet  Commonly known as:  IMDUR  Take 15 mg by mouth at bedtime.     metFORMIN 1000 MG tablet  Commonly known as:  GLUCOPHAGE  Take 1 tablet (1,000 mg total) by mouth 2 (two) times daily with a meal. HOLD for 2 days, restart on 02/25/2014     metoCLOPramide 10 MG tablet  Commonly known as:  REGLAN  Take 5 mg by mouth daily before breakfast.     metoprolol succinate 50 MG 24 hr tablet  Commonly known as:  TOPROL-XL  Take 25 mg by mouth every morning.     morphine 15 MG tablet  Commonly known as:  MSIR  Take 15 mg by mouth 3 (three) times daily as needed for severe pain.     multivitamins ther. w/minerals Tabs tablet  Take 1 tablet by mouth daily.     niacin 750 MG CR tablet  Commonly known as:  NIASPAN  Take  750-1,500 mg by mouth at bedtime. Alternates every 2 days -  x 2 days,  x 2 days,  x 2 days...Marland KitchenMarland Kitchen     PARoxetine 20 MG tablet  Commonly known as:  PAXIL  Take 20 mg by mouth every evening.     pregabalin 75 MG capsule  Commonly known as:  LYRICA  Take 1 capsule (75 mg total) by mouth 2 (two) times daily.     tamsulosin 0.4 MG Caps capsule  Commonly known as:  FLOMAX  Take 0.4 mg by mouth daily after supper.     ULTRAFLORA IMMUNE HEALTH PO  Take 1 tablet by mouth daily before breakfast.       Allergies  Allergen Reactions  . Darvocet [Propoxyphene N-Acetaminophen] Nausea And Vomiting  . Penicillins Nausea And Vomiting      The results of significant diagnostics from this hospitalization (including imaging, microbiology, ancillary and laboratory) are listed below for reference.    Significant Diagnostic Studies: Ct Head Wo Contrast  06/04/2014   CLINICAL DATA:  Initial encounter for patient found unresponsive  EXAM: CT HEAD WITHOUT CONTRAST  TECHNIQUE: Contiguous axial images were obtained from the base of the skull through the vertex without intravenous contrast.  COMPARISON:  Outside MRI from 07/20/2009.  MRI from 05/04/2007.  FINDINGS: There is no evidence for acute hemorrhage, hydrocephalus, mass lesion, or abnormal extra-axial fluid collection. Diffuse loss of parenchymal volume is consistent with atrophy. No definite CT evidence for acute infarction. The visualized paranasal sinuses and mastoid air cells are clear.  IMPRESSION: Mild atrophy.  No acute intracranial abnormality.   Electronically Signed   By: Kennith Center M.D.   On: 06/04/2014 16:03   Mr Brain Wo Contrast  06/08/2014   CLINICAL DATA:  Encephalopathy  EXAM: MRI HEAD WITHOUT CONTRAST  TECHNIQUE: Multiplanar, multiecho pulse sequences of the brain and surrounding structures were obtained without intravenous contrast.  COMPARISON:  CT head 06/04/2014  FINDINGS: Image quality degraded by mild motion.   Mild atrophy.  Negative for hydrocephalus.  5 mm area of restricted diffusion left occipital lobe consistent with acute infarct. No other areas of acute infarct identified  Negative for hemorrhage or fluid collection  Negative for mass lesion.  No significant chronic ischemic changes are present.  Sagittal T1  images inadvertently not obtained.  IMPRESSION: Small 5 mm area of acute infarct in the left occipital lobe. Otherwise negative.   Electronically Signed   By: Marlan Palau M.D.   On: 06/08/2014 16:44   Dg Chest Port 1 View  06/17/2014   CLINICAL DATA:  SOB. Hx HTN, CAD, and diabetes.  EXAM: PORTABLE CHEST - 1 VIEW  COMPARISON:  06/13/2014  FINDINGS: There is hazy bilateral mid and lower lung zone opacity, with some slight clearing in the upper lungs when compared to the prior exam. No pneumothorax.  Cardiac silhouette is normal size configuration.  Left PICC shows a changed position. The tip now curls projecting in the superior vena cava near its confluence with the left brachiocephalic vein.  IMPRESSION: 1. Slight improvement with relative clearing in the upper lungs with her the prior study. There is persistent hazy airspace opacity in the mid and lower lungs likely combination of pleural effusions with parenchymal infiltrates. 2. PICC tip now appears curl in the mid superior vena cava represented change from the prior exam. It appears mildly retracted from the prior exam. 3. No other change.   Electronically Signed   By: Amie Portland M.D.   On: 06/17/2014 15:31   Dg Chest Port 1 View  06/13/2014   CLINICAL DATA:  Respiratory failure.  EXAM: PORTABLE CHEST - 1 VIEW  COMPARISON:  June 11, 2014.  FINDINGS: Stable cardiomediastinal silhouette. Endotracheal tube has been removed. Feeding tube is unchanged in position. Left-sided PICC line is also unchanged with distal tip overlying expected position of cavoatrial junction. No pneumothorax is noted. Increased bilateral perihilar and basilar opacities  are noted concerning for edema or possibly pneumonia. Increased mild bilateral pleural effusions are noted as well.  IMPRESSION: Increased bilateral perihilar and basilar opacities are noted concerning for worsening edema or possibly pneumonia. Increased mild bilateral pleural effusions are noted as well.   Electronically Signed   By: Roque Lias M.D.   On: 06/13/2014 11:58   Dg Chest Port 1 View  06/11/2014   CLINICAL DATA:  Shortness of breath  EXAM: PORTABLE CHEST - 1 VIEW  COMPARISON:  06/10/2014  FINDINGS: Endotracheal tube with the tip 4 cm above the carina. Left subclavian central venous catheter with the tip projecting over the SVC. Feeding to coursing below the diaphragm with the tip excluded from the field of view. Bilateral mild interstitial thickening. Possible trace left pleural effusion. No pneumothorax. Stable cardiomediastinal silhouette. No acute osseous abnormality.  IMPRESSION: 1. Endotracheal tube with the tip 4 cm above the carina. 2. Bilateral interstitial and alveolar airspace opacities concerning for mild interstitial edema versus infection. No significant interval change compared with the prior exam.   Electronically Signed   By: Elige Ko   On: 06/11/2014 08:01   Dg Chest Port 1 View  06/10/2014   CLINICAL DATA:  Acute respiratory failure and hypoxia.  EXAM: PORTABLE CHEST - 1 VIEW  COMPARISON:  06/09/2014  FINDINGS: The endotracheal tube tip is above the carina. There is a left IJ catheter with tip in the SVC. Feeding tube tip is in the stomach. Normal heart size. There is mild diffuse edema, increased from previous exam. Patchy airspace opacities in the left lung are similar to previous exam.  IMPRESSION: 1. New patchy airspace opacities in the left lung. 2. Increased interstitial edema.   Electronically Signed   By: Signa Kell M.D.   On: 06/10/2014 07:58   Dg Chest Port 1 View  06/09/2014  CLINICAL DATA:  Acute respiratory failure  EXAM: PORTABLE CHEST - 1 VIEW   COMPARISON:  Chest x-ray from yesterday  FINDINGS: Endotracheal tube remains between the clavicular heads and carina. The left IJ catheter tip is in stable position at the SVC. The feeding tube continues into the stomach at least.  Bibasilar and left upper lung opacities persist. Diffuse interstitial opacities have gradually cleared, as has the left mid lung. There may be a small left pleural effusion. No pneumothorax.  IMPRESSION: 1. Tubes and central line remain in good position. 2. Continued improvement in aeration. Residual opacities could reflect pneumonia or atelectasis.   Electronically Signed   By: Tiburcio Pea M.D.   On: 06/09/2014 05:16   Dg Chest Port 1 View  06/08/2014   CLINICAL DATA:  Shortness of breath  EXAM: PORTABLE CHEST - 1 VIEW  COMPARISON:  06/07/2014  FINDINGS: Endotracheal tube tip at the clavicular heads. There is a new feeding tube which continues below the diaphragm, tip at the stomach based on KUB from yesterday. Stable left IJ catheter, tip at the upper cavoatrial junction.  Normal heart size and mediastinal contours. Bilateral interstitial and airspace opacities are unchanged, asymmetric to the left mid and upper lung.  IMPRESSION: 1. Stable positioning of endotracheal tube and left IJ catheter. Feeding tube tip noted KUB from yesterday. 2. Unchanged asymmetric pulmonary edema versus pneumonia.   Electronically Signed   By: Tiburcio Pea M.D.   On: 06/08/2014 06:20   Dg Chest Port 1 View  06/07/2014   CLINICAL DATA:  Subsequent evaluation for respiratory failure  EXAM: PORTABLE CHEST - 1 VIEW  COMPARISON:  06/06/2014  FINDINGS: No change in the position of left central line or endotracheal tube.  Heart size upper normal and stable. Mild vascular congestion, with persistent peribronchial cuffing and scattered foci of bilateral interstitial and alveolar opacities. When compared to the prior study, bilateral opacities are slightly worse. Tiny left effusion not significantly  different.  IMPRESSION: Findings again consistent with moderate pulmonary edema slightly worse when compared to the prior study.   Electronically Signed   By: Esperanza Heir M.D.   On: 06/07/2014 10:47   Dg Chest Port 1 View  06/06/2014   CLINICAL DATA:  Shortness of breath  EXAM: PORTABLE CHEST - 1 VIEW  COMPARISON:  06/04/2014  FINDINGS: Endotracheal tube tip at the clavicular heads. A left IJ catheter remains at the SVC level. Gastric suction tube has been removed.  Increasing diffuse interstitial opacities with a hazy appearance of the lower chest, likely pleural fluid. No pneumothorax. Heart size is normal. Unchanged aortic and hilar contours. Biapical pleural/extrapleural thickening which is symmetric.  IMPRESSION: 1. Endotracheal tube and left IJ catheter remain in good position. 2. Interval development of pulmonary edema and small pleural effusions.   Electronically Signed   By: Tiburcio Pea M.D.   On: 06/06/2014 04:56   Dg Chest Port 1 View  06/04/2014   CLINICAL DATA:  Acute respiratory failure. Upper GI bleed. New endotracheal tube and central line placement.  EXAM: PORTABLE CHEST - 1 VIEW 1:25 p.m.  COMPARISON:  06/04/2014 at 11:08 a.m.  FINDINGS: Endotracheal tube is in good position at the level of the thoracic inlet. New left jugular vein catheter tip is in the superior vena cava above the cavoatrial junction in good position. New NG tube tip is below the diaphragm.  The heart size and pulmonary vascularity are normal. Right lung is clear. Slight haziness in the left lung could  represent unilateral pulmonary edema. Chronic bilateral apical pleural thickening.  IMPRESSION: Tubes and lines in good position. Hazy infiltrate in the left lung has improved and probably represents unilateral pulmonary edema.   Electronically Signed   By: Geanie Cooley M.D.   On: 06/04/2014 15:13   Dg Chest Portable 1 View  06/04/2014   CLINICAL DATA:  Intubation.  EXAM: PORTABLE CHEST - 1 VIEW  COMPARISON:   02/16/2014.  FINDINGS: Endotracheal tube noted with its tip 1.3 cm above the carina. Diffuse left lung infiltrate noted consistent with pneumonia. Right lung is clear. Heart size is normal. No acute bony abnormality.  IMPRESSION: 1. Endotracheal tube noted with its tip 1.3 cm above the carina. 2. Diffuse left lung pulmonary infiltrate.   Electronically Signed   By: Maisie Fus  Register   On: 06/04/2014 11:32   Dg Abd Portable 1v  06/09/2014   CLINICAL DATA:  Encounter for nasogastric tube placement.  EXAM: PORTABLE ABDOMEN - 1 VIEW  COMPARISON:  06/04/2014  FINDINGS: Feeding tube tip is in the proximal stomach.  The bowel gas pattern is nonobstructive. Cholecystectomy clips are noted. A pelvic themistor is partly visible.  IMPRESSION: Feeding tube tip in the proximal stomach.   Electronically Signed   By: Tiburcio Pea M.D.   On: 06/09/2014 05:17   Dg Abd Portable 1v  06/07/2014   CLINICAL DATA:  Status post feeding catheter placement  EXAM: PORTABLE ABDOMEN - 1 VIEW  COMPARISON:  06/04/14  FINDINGS: A nasogastric catheter has been removed. A new feeding catheter is now seen just beyond the gastroesophageal junction. This could be advanced if desired. The cardiac shadow is stable. Left retrocardiac atelectasis is noted. No other focal abnormality is seen.  IMPRESSION: Feeding catheter just beyond the gastroesophageal junction.  These results will be called to the ordering clinician or representative by the Radiologist Assistant, and communication documented in the PACS or zVision Dashboard.   Electronically Signed   By: Alcide Clever M.D.   On: 06/07/2014 12:31   Dg Abd Portable 1v  06/04/2014   CLINICAL DATA:  Gastrointestinal bleeding  EXAM: PORTABLE ABDOMEN - 1 VIEW  COMPARISON:  None.  FINDINGS: There is a nasogastric tube with tip over the gastric body. A pelvic thermistor is also noted.  Bowel gas pattern is nonobstructive.  A right iliac artery stent is noted.  IMPRESSION: Nasogastric tube tip over the  gastric body.   Electronically Signed   By: Tiburcio Pea M.D.   On: 06/04/2014 23:54   Dg Swallowing Func-speech Pathology  06/16/2014   Angela Nevin, CCC-SLP     06/16/2014  1:12 PM  Objective Swallowing Evaluation: Modified Barium Swallowing Study   Patient Details  Name: CROIX PRESLEY MRN: [id number] Date of Birth: [birth year]  Today's Date: 06/16/2014 Time: SLP Start Time (ACUTE ONLY): 1130-SLP Stop Time (ACUTE  ONLY): 1200 SLP Time Calculation (min) (ACUTE ONLY): 30 min  Past Medical History:  Past Medical History  Diagnosis Date  . Arthritis   . GERD (gastroesophageal reflux disease)   . Hyperlipidemia   . Hypertension   . Coronary artery disease   . Carotid artery disease   . Peripheral arterial disease     50% ostial right common iliac artery stenosis without  claudication  . Emphysema   . Shortness of breath   . Diabetes mellitus without complication     type 2  . Anxiety    Past Surgical History:  Past Surgical History  Procedure Laterality Date  .  Colonoscopy    . Cardiac catheterization      3 stents placed  . Cholecystectomy N/A 04/07/2013    Procedure: LAPAROSCOPIC CHOLECYSTECTOMY WITH INTRAOPERATIVE  CHOLANGIOGRAM;  Surgeon: Robyne Askew, MD;  Location: [facility] OR;   Service: General;  Laterality: N/A;  . Iliac artery stent Right 02/22/2014    Dr. Allyson Sabal 7 mm x 22 mm  ICast covered stent  . Coronary angioplasty      all 3 heart vessels   . Esophagogastroduodenoscopy Left 06/05/2014    Procedure: ESOPHAGOGASTRODUODENOSCOPY (EGD);  Surgeon: Theda Belfast, MD;  Location: [facility] ENDOSCOPY;  Service: Endoscopy;   Laterality: Left;   HPI:  HPI: 81 year old male hx of arthritis, GERD, HLD, HTN, CAD s/p 3  stents, emphysema, PAD, admitted with UGI bleed. NGT lavaged.  Noted to be agitated overnight 1/22. MRI - small 5mm acute  infarct in left occipital lobe, likely not cause for mental  status changes per MD notes. Intubated 1/18-1/25 for respiratory  failure. Also with esophagitis and Aspiration pneumonia -   improving, but then new infiltrates on CXR 1/27.   No Data Recorded  Assessment / Plan / Recommendation CHL IP CLINICAL IMPRESSIONS 06/16/2014  Dysphagia Diagnosis Severe pharyngeal phase dysphagia;Severe oral  phase dysphagia  Clinical impression Patient presents with a severe oropharyngeal  dysphagia, characterized by oral and pharyngeal muscle weakness  resulting in significant delay in anterior to posterior transit  of puree and nextar thick liquid boluses despite patient trying  to initiate swallow. Patient also presents with a sensory  component of his dysphagia, characterized by swallow initiation  delays to vallecular sinus with puree and nectar thick liquids,  and pyriform sinus with thin liquids. Patient did not sense  penetration until it had contacted his cords. Thin liquids  resulted in silent aspiration. Moderate  amount of residuals  remained in vallecular sinus at end of study, despite cued dry  swallows and patient trying to swallow. Presence of green dye  from FEES on 1/28 on patient's tongue. At this time, patient is  not safe to consume any P.O.'s.       CHL IP TREATMENT RECOMMENDATION 06/16/2014  Treatment Plan Recommendations Therapy as outlined in treatment  plan below     CHL IP DIET RECOMMENDATION 06/16/2014  Diet Recommendations NPO;Alternative means - temporary;Ice chips  PRN after oral care  Liquid Administration via (None)  Medication Administration Via alternative means  Compensations (None)  Postural Changes and/or Swallow Maneuvers (None)     CHL IP OTHER RECOMMENDATIONS 06/16/2014  Recommended Consults (None)  Oral Care Recommendations Oral care prior to ice chips  Other Recommendations (None)     CHL IP FOLLOW UP RECOMMENDATIONS 06/16/2014  Follow up Recommendations Inpatient Rehab     CHL IP FREQUENCY AND DURATION 06/16/2014  Speech Therapy Frequency (ACUTE ONLY) min 3x week  Treatment Duration 2 weeks     Pertinent Vitals/Pain     SLP Swallow Goals No flowsheet data found.  No flowsheet  data found.    CHL IP REASON FOR REFERRAL 06/16/2014  Reason for Referral Objectively evaluate swallowing function     CHL IP ORAL PHASE 06/16/2014  Lips (None)  Tongue (None)  Mucous membranes (None)  Nutritional status (None)  Other (None)  Oxygen therapy (None)  Oral Phase Impaired  Oral - Pudding Teaspoon NT  Oral - Pudding Cup NT  Oral - Honey Teaspoon (None)  Oral - Honey Cup (None)  Oral - Honey Syringe (None)  Oral - Nectar Teaspoon (None)  Oral - Nectar Cup (None)  Oral - Nectar Straw (None)  Oral - Nectar Syringe (None)  Oral - Ice Chips (None)  Oral - Thin Teaspoon (None)  Oral - Thin Cup (None)  Oral - Thin Straw (None)  Oral - Thin Syringe (None)  Oral - Puree Weak lingual manipulation;Lingual pumping;Delayed  oral transit;Reduced posterior propulsion;Lingual/palatal residue   Oral - Mechanical Soft (None)  Oral - Regular (None)  Oral - Multi-consistency (None)  Oral - Pill (None)  Oral Phase - Comment (None)      CH[facility] IP PHARYNGEAL PHASE 06/16/2014  Pharyngeal Phase (None)  Pharyngeal - Pudding Teaspoon NT  Penetration/Aspiration details (pudding teaspoon) (None)  Pharyngeal - Pudding Cup (None)  Penetration/Aspiration details (pudding cup) (None)  Pharyngeal - Honey Teaspoon (None)  Penetration/Aspiration details (honey teaspoon) (None)  Pharyngeal - Honey Cup (None)  Penetration/Aspiration details (honey cup) (None)  Pharyngeal - Honey Syringe (None)  Penetration/Aspiration details (honey syringe) (None)  Pharyngeal - Nectar Teaspoon Delayed swallow initiation;Premature  spillage to valleculae;Pharyngeal residue - valleculae;Reduced  laryngeal elevation;Reduced tongue base retraction;Pharyngeal  residue - pyriform sinuses;Penetration/Aspiration during swallow  Penetration/Aspiration details (nectar teaspoon) (None)  Pharyngeal - Nectar Cup Delayed swallow initiation;Premature  spillage to valleculae;Pharyngeal residue - pyriform  sinuses;Reduced tongue base retraction;Reduced laryngeal   elevation;Penetration/Aspiration during swallow;Pharyngeal  residue - valleculae  Penetration/Aspiration details (nectar cup) Material enters  airway, passes BELOW cords then ejected out  Pharyngeal - Nectar Straw (None)  Penetration/Aspiration details (nectar straw) (None)  Pharyngeal - Nectar Syringe (None)  Penetration/Aspiration details (nectar syringe) (None)  Pharyngeal - Ice Chips NT  Penetration/Aspiration details (ice chips) (None)  Pharyngeal - Thin Teaspoon Reduced laryngeal  elevation;Penetration/Aspiration during swallow;Reduced tongue  base retraction;Reduced airway/laryngeal closure;Premature  spillage to pyriform sinuses  Penetration/Aspiration details (thin teaspoon) Material enters  airway, passes BELOW cords without attempt by patient to eject  out (silent aspiration) Pharyngeal - Thin Cup NT  Penetration/Aspiration details (thin cup) (None)  Pharyngeal - Thin Straw (None)  Penetration/Aspiration details (thin straw) (None)  Pharyngeal - Thin Syringe (None)  Penetration/Aspiration details (thin syringe') (None)  Pharyngeal - Puree Delayed swallow initiation;Reduced tongue base  retraction;Pharyngeal residue - valleculae;Pharyngeal residue -  pyriform sinuses;Premature spillage to valleculae;Reduced  laryngeal elevation;Reduced pharyngeal  peristalsis;Penetration/Aspiration after swallow  Penetration/Aspiration details (puree) Material enters airway,  remains ABOVE vocal cords then ejected out  Pharyngeal - Mechanical Soft (None)  Penetration/Aspiration details (mechanical soft) (None)  Pharyngeal - Regular (None)  Penetration/Aspiration details (regular) (None)  Pharyngeal - Multi-consistency (None)  Penetration/Aspiration details (multi-consistency) (None)  Pharyngeal - Pill (None)  Penetration/Aspiration details (pill) (None)  Pharyngeal Comment (None)     No flowsheet data found.  No flowsheet data found.         Elio Forgetreston, John Tarrell 06/16/2014, 1:11 PM  Angela NevinJohn T. Preston, MA, CCC-SLP 06/16/2014  1:12 PM   Ct Portable Head W/o Cm  06/12/2014   CLINICAL DATA:  Asymmetric pupils, right larger than left  EXAM: CT HEAD WITHOUT CONTRAST  TECHNIQUE: Contiguous axial images were obtained from the base of the skull through the vertex without intravenous contrast.  COMPARISON:  Head CT June 04, 2014 and brain MRI June 08, 2014  FINDINGS: Mild diffuse atrophy is stable. There is no intracranial mass, hemorrhage, extra-axial fluid collection, or midline shift. There is mild patchy small vessel disease in the centra semiovale bilaterally. Elsewhere, gray-white compartments appear normal. No acute infarct is appreciable on this study. The bony calvarium appears intact. The  mastoid air cells are clear.  IMPRESSION: Mild diffuse atrophy with patchy periventricular small vessel disease. No intracranial mass, hemorrhage, extra-axial fluid collection, or acute appearing infarct.   Electronically Signed   By: Bretta Bang M.D.   On: 06/12/2014 19:13    Microbiology: Recent Results (from the past 240 hour(s))  Culture, blood (routine x 2)     Status: None   Collection Time: 06/13/14  3:20 PM  Result Value Ref Range Status   Specimen Description BLOOD RIGHT ARM  Final   Special Requests BOTTLES DRAWN AEROBIC ONLY 1 CC  Final   Culture   Final    NO GROWTH 5 DAYS Performed at [facility]    Report Status 06/19/2014 FINAL  Final  Culture, blood (routine x 2)     Status: None   Collection Time: 06/13/14  3:43 PM  Result Value Ref Range Status   Specimen Description BLOOD RIGHT FOREARM  Final   Special Requests BOTTLES DRAWN AEROBIC ONLY 1.5 CC  Final   Culture   Final    NO GROWTH 5 DAYS Performed at [facility]    Report Status 06/19/2014 FINAL  Final  Culture, Urine     Status: None   Collection Time: 06/13/14  7:52 PM  Result Value Ref Range Status   Specimen Description URINE, CATHETERIZED  Final   Special Requests Normal  Final   Colony Count NO GROWTH Performed at  [facility]   Final   Culture NO GROWTH Performed at [facility]   Final   Report Status 06/14/2014 FINAL  Final     Labs: Basic Metabolic Panel:  Recent Labs Lab 06/15/14 0325 06/16/14 0323 06/17/14 0230 06/17/14 1325 06/18/14 0510  NA 146* 143 144  --  144  K 3.7 3.7 3.3* 4.7 3.3*  CL 108 107 109  --  109  CO2 30 25 25   --  28  GLUCOSE 129* 128* 117*  --  131*  BUN 25* 23 25*  --  20  CREATININE 1.21 1.41* 1.38*  --  1.22  CALCIUM 8.9 9.1 9.0  --  8.9  MG  --  1.9 1.9 3.2* 2.2  PHOS  --  4.3  --   --   --    Liver Function Tests:  Recent Labs Lab 06/16/14 0323 06/17/14 0230 06/18/14 0510  AST 22 21 29   ALT 26 24 29   ALKPHOS 65 59 57  BILITOT 1.4* 1.2 1.1  PROT 5.4* 4.7* 5.0*  ALBUMIN 2.4* 2.2* 2.2*   No results for input(s): LIPASE, AMYLASE in the last 168 hours. No results for input(s): AMMONIA in the last 168 hours. CBC:  Recent Labs Lab 06/15/14 0325 06/15/14 2100 06/16/14 0323 06/17/14 0230 06/18/14 0510  WBC 14.0* 13.6* 12.6* 9.8 9.8  NEUTROABS  --  11.5*  --  7.9* 8.0*  HGB 9.4* 9.4* 9.9* 8.8* 9.0*  HCT 28.7* 28.4* 30.3* 27.6* 27.7*  MCV 94.4 95.9 94.1 95.2 95.2  PLT 299 335 372 361 384   Cardiac Enzymes:  Recent Labs Lab 06/16/14 2030 06/17/14 0230 06/17/14 0857  TROPONINI 0.05* 0.06* 0.05*   BNP: BNP (last 3 results)  Recent Labs  06/04/14 1400  BNP 81.4    ProBNP (last 3 results) No results for input(s): PROBNP in the last 8760 hours.  CBG:  Recent Labs Lab 06/18/14 0005 06/18/14 0434 06/18/14 0735 06/18/14 1218 06/18/14 1629  GLUCAP 147* 114* 119* 142* 122*  Signed:  Dia Crawford, MD [facility] [phone] pager

## 2014-06-21 NOTE — Progress Notes (Signed)
[facility] nurse [employer] Fogg.

## 2014-06-21 NOTE — Clinical Social Work Note (Addendum)
[facility], spoke to admissions, who said they do have a bed available, the hospice home will review patient's information and then contact CSW.  Contacted patient's family to give an update on status of transferring to hospice home.  Patient has a bed available and can take him today.  Contacted paged physician to inform him that patient can discharge today to hospice.  Awaiting dc summary and signed DNR.  Ervin KnackEric R. Jospeh Mangel, MSW, Theresia MajorsLCSWA [phone] 06/21/2014 10:32 AM

## 2014-06-21 NOTE — Progress Notes (Signed)
[facility]

## 2014-07-17 DEATH — deceased

## 2014-12-07 ENCOUNTER — Ambulatory Visit: Payer: Medicare Other | Admitting: Neurology

## 2015-03-24 IMAGING — CT CT ABD-PELV W/ CM
2 of 5 series · 16 of 46 positions shown, 18 images · IV contrast (APPLIED)
Comparison: 12/13/2012

CLINICAL DATA: Right lower quadrant pain

EXAM:
CT ABDOMEN AND PELVIS WITH CONTRAST
TECHNIQUE: Multidetector CT imaging of the abdomen and pelvis was performed
using the standard protocol following bolus administration of
intravenous contrast.
CONTRAST:  80mL OMNIPAQUE IOHEXOL 300 MG/ML  SOLN

[Series 2: abd/ pelvis 5.0 i30f 1 · axial · 0.87mm/px · z∈[+726,+1156]mm · 13 of 98 slices shown, 15 images]
[im 6/98  soft-tissue]
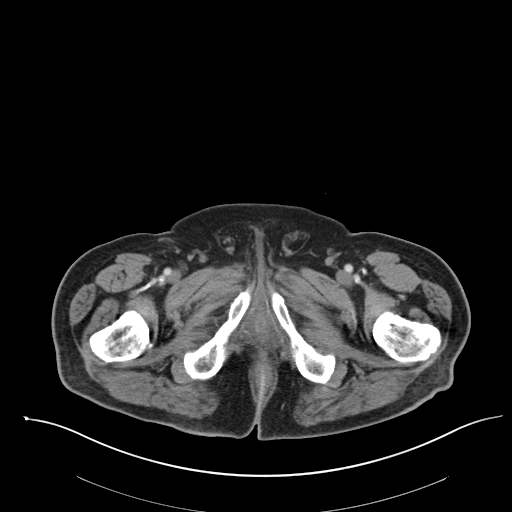
[im 6/98  bone]
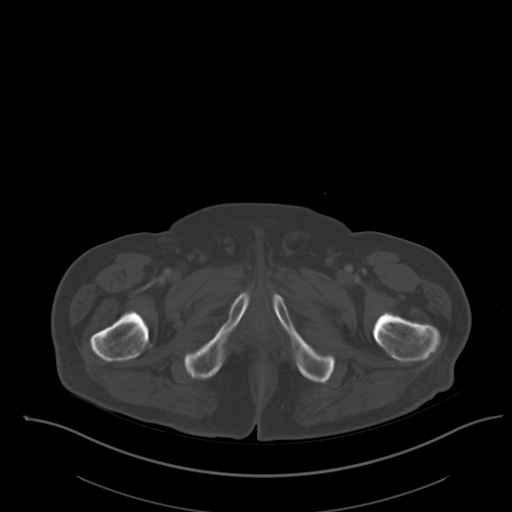
[im 16/98  soft-tissue]
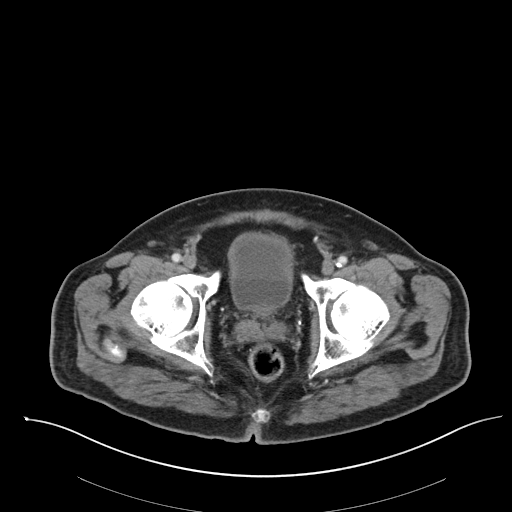
[im 21/98  soft-tissue]
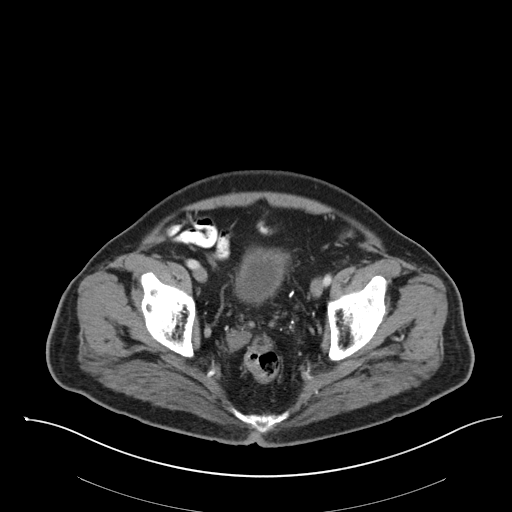
[im 26/98  soft-tissue]
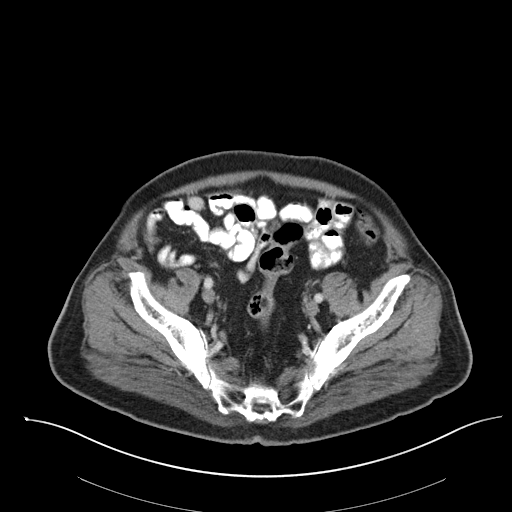
[im 36/98  soft-tissue]
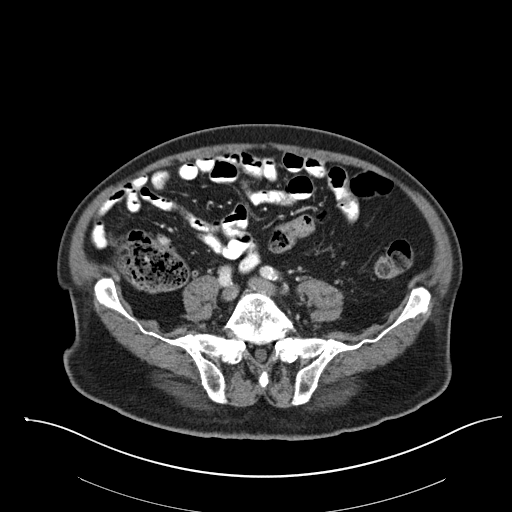
[im 41/98  soft-tissue]
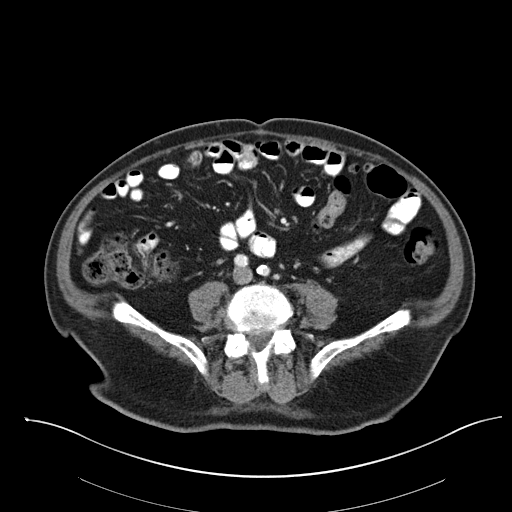
[im 52/98  soft-tissue]
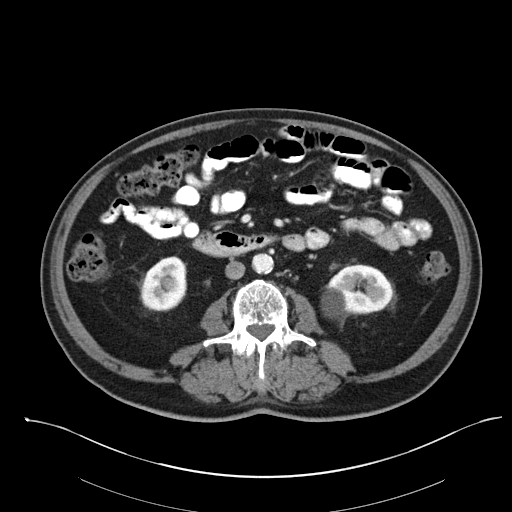
[im 57/98  soft-tissue]
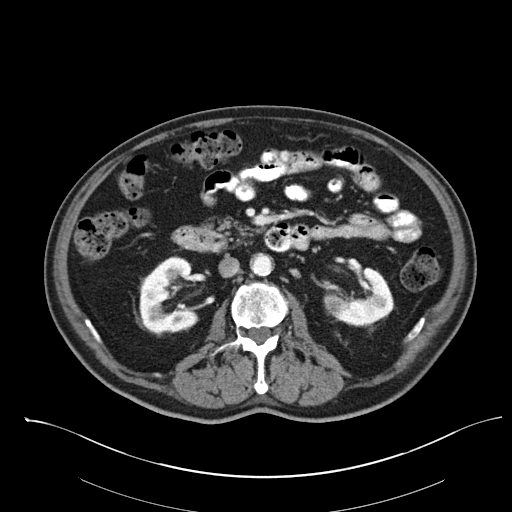
[im 62/98  soft-tissue]
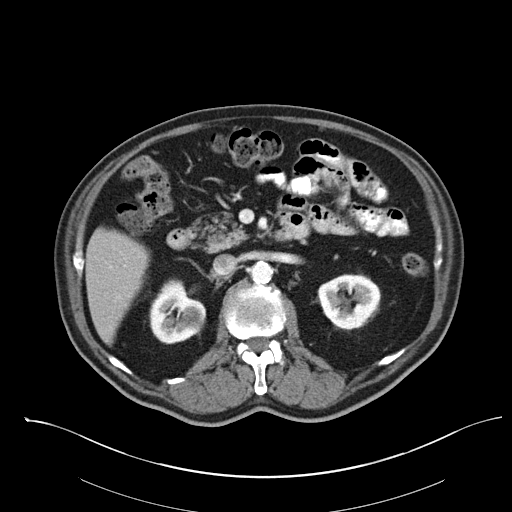
[im 62/98  bone]
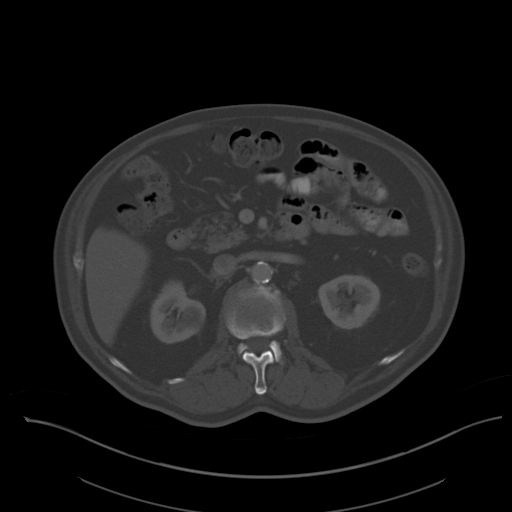
[im 72/98  soft-tissue]
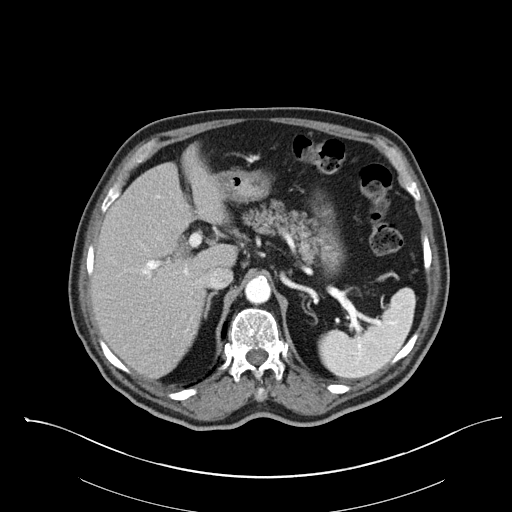
[im 77/98  soft-tissue]
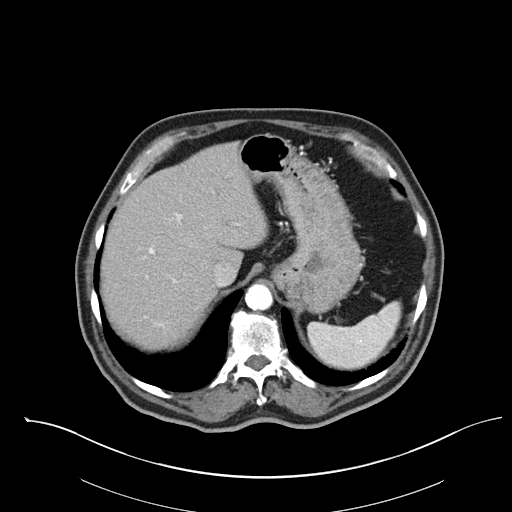
[im 82/98  soft-tissue]
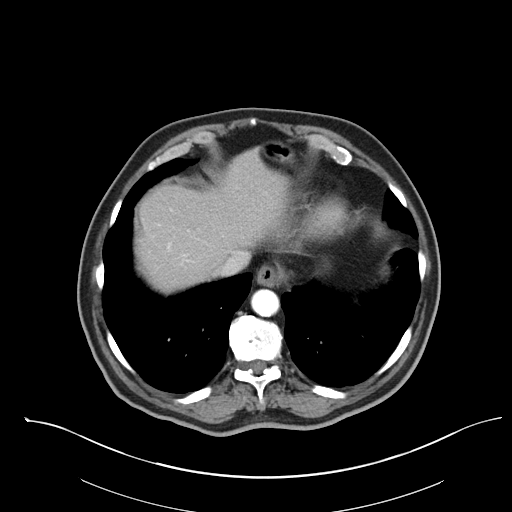
[im 92/98  soft-tissue]
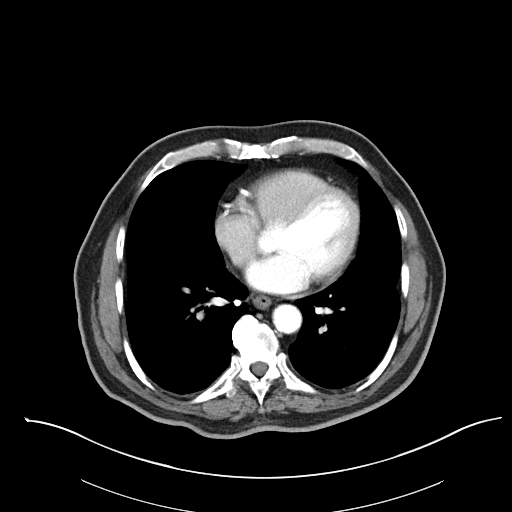

[Series 5: cor · coronal · 0.83mm/px · 3 of 131 slices shown]
[im 44/131  soft-tissue]
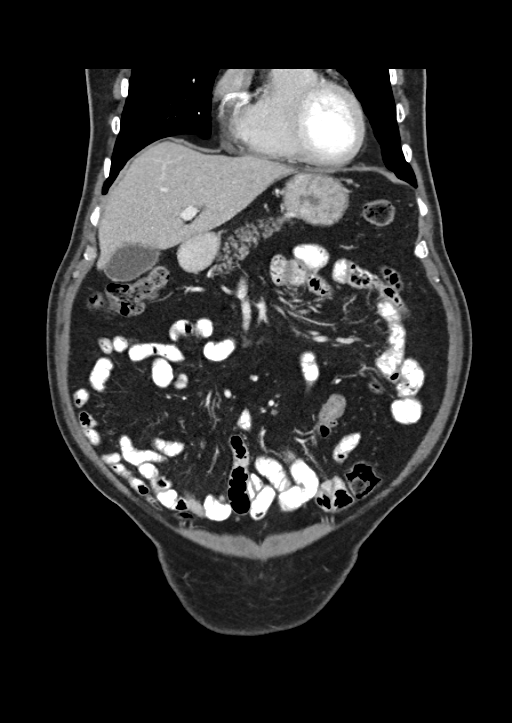
[im 58/131  soft-tissue]
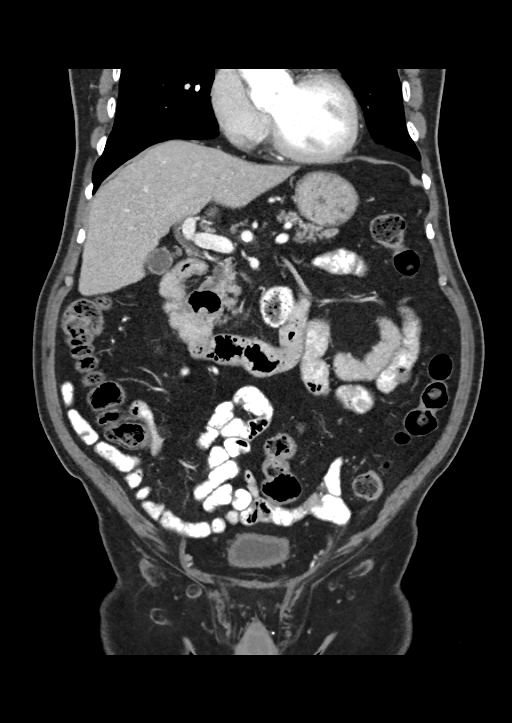
[im 73/131  soft-tissue]
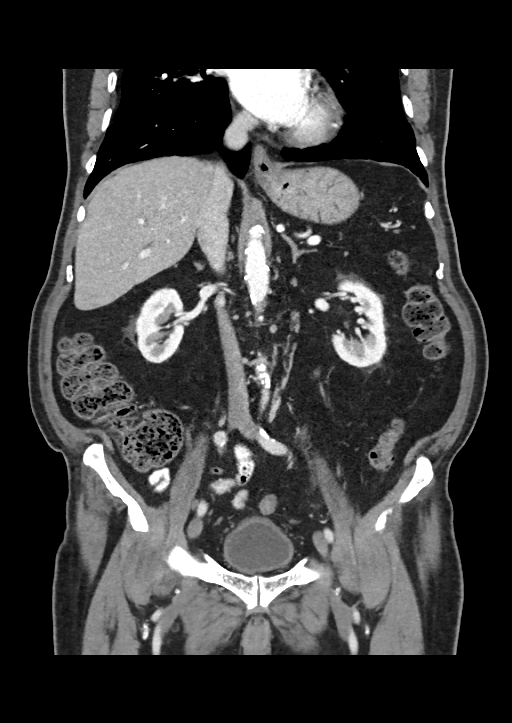

[16 of 46 positions shown; findings below may reference images not displayed]

FINDINGS: Lung bases are within normal limits.

The liver, spleen, adrenal glands and pancreas are within normal
limits. The gallbladder is well distended. No definitive calculi are
seen. Some densities are noted in the wall which may correspond to
the previous diagnosis of adenomyomatosis.

The kidneys are well visualized bilaterally and reveal cystic
change. This is stable from the prior exam. No renal calculi or
obstructive changes are noted. The appendix is air-filled and within
normal limits. The bladder is partially distended. Diffuse prostatic
calcifications are noted. No pelvic mass lesion or sidewall
abnormality is seen. No acute bony abnormality is seen.
IMPRESSION: Chronic changes without acute abnormality.

## 2016-07-03 IMAGING — CR DG CHEST 1V PORT
1 series · 1 of 1 positions shown · non-contrast
Comparison: 06/04/2014 at [DATE] a.m.

CLINICAL DATA: Acute respiratory failure. Upper GI bleed. New
endotracheal tube and central line placement.

EXAM:
PORTABLE CHEST - 1 VIEW [DATE] p.m.

[AP]
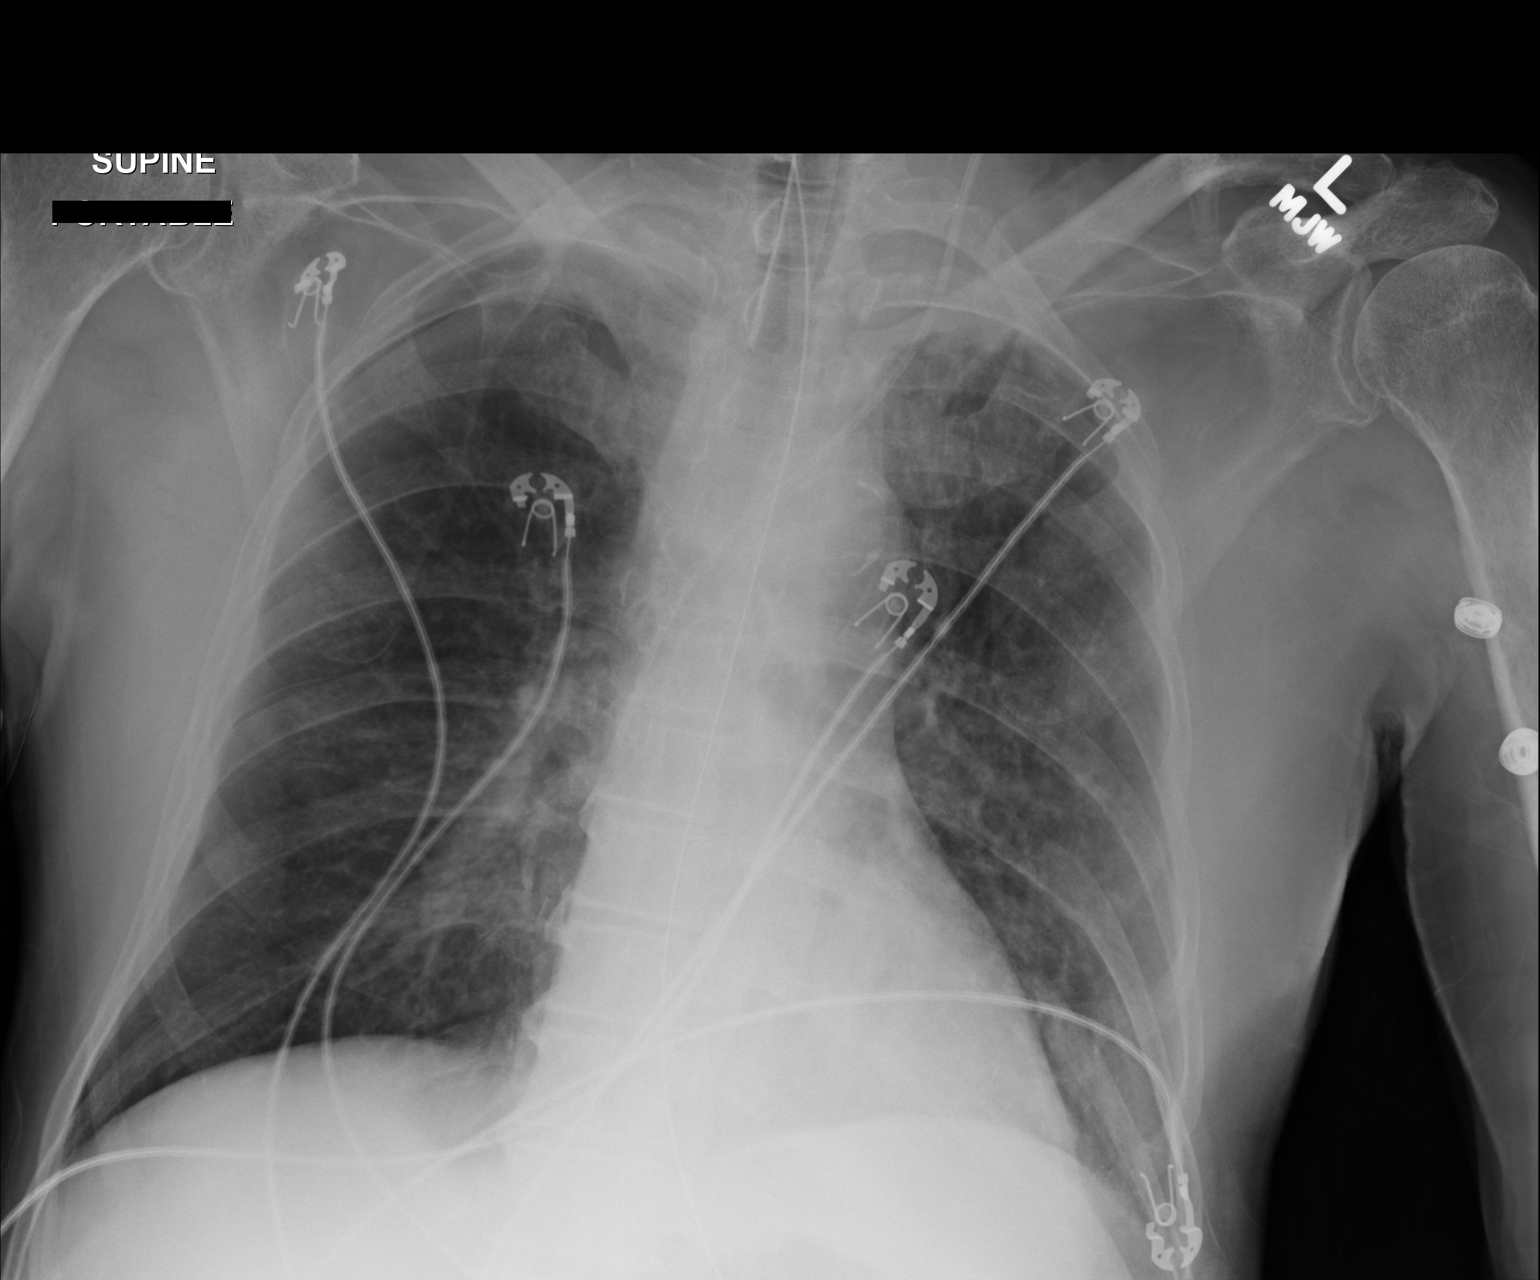

[1 of 1 positions shown; findings below may reference images not displayed]

FINDINGS: Endotracheal tube is in good position at the level of the thoracic
inlet. New left jugular vein catheter tip is in the superior vena
cava above the cavoatrial junction in good position. New NG tube tip
is below the diaphragm.

The heart size and pulmonary vascularity are normal. Right lung is
clear. Slight haziness in the left lung could represent unilateral
pulmonary edema. Chronic bilateral apical pleural thickening.
IMPRESSION: Tubes and lines in good position. Hazy infiltrate in the left lung
has improved and probably represents unilateral pulmonary edema.

## 2016-07-03 IMAGING — CR DG ABD PORTABLE 1V
1 series · 1 of 1 positions shown · non-contrast
Comparison: None.

CLINICAL DATA: Gastrointestinal bleeding

EXAM:
PORTABLE ABDOMEN - 1 VIEW

[AP]
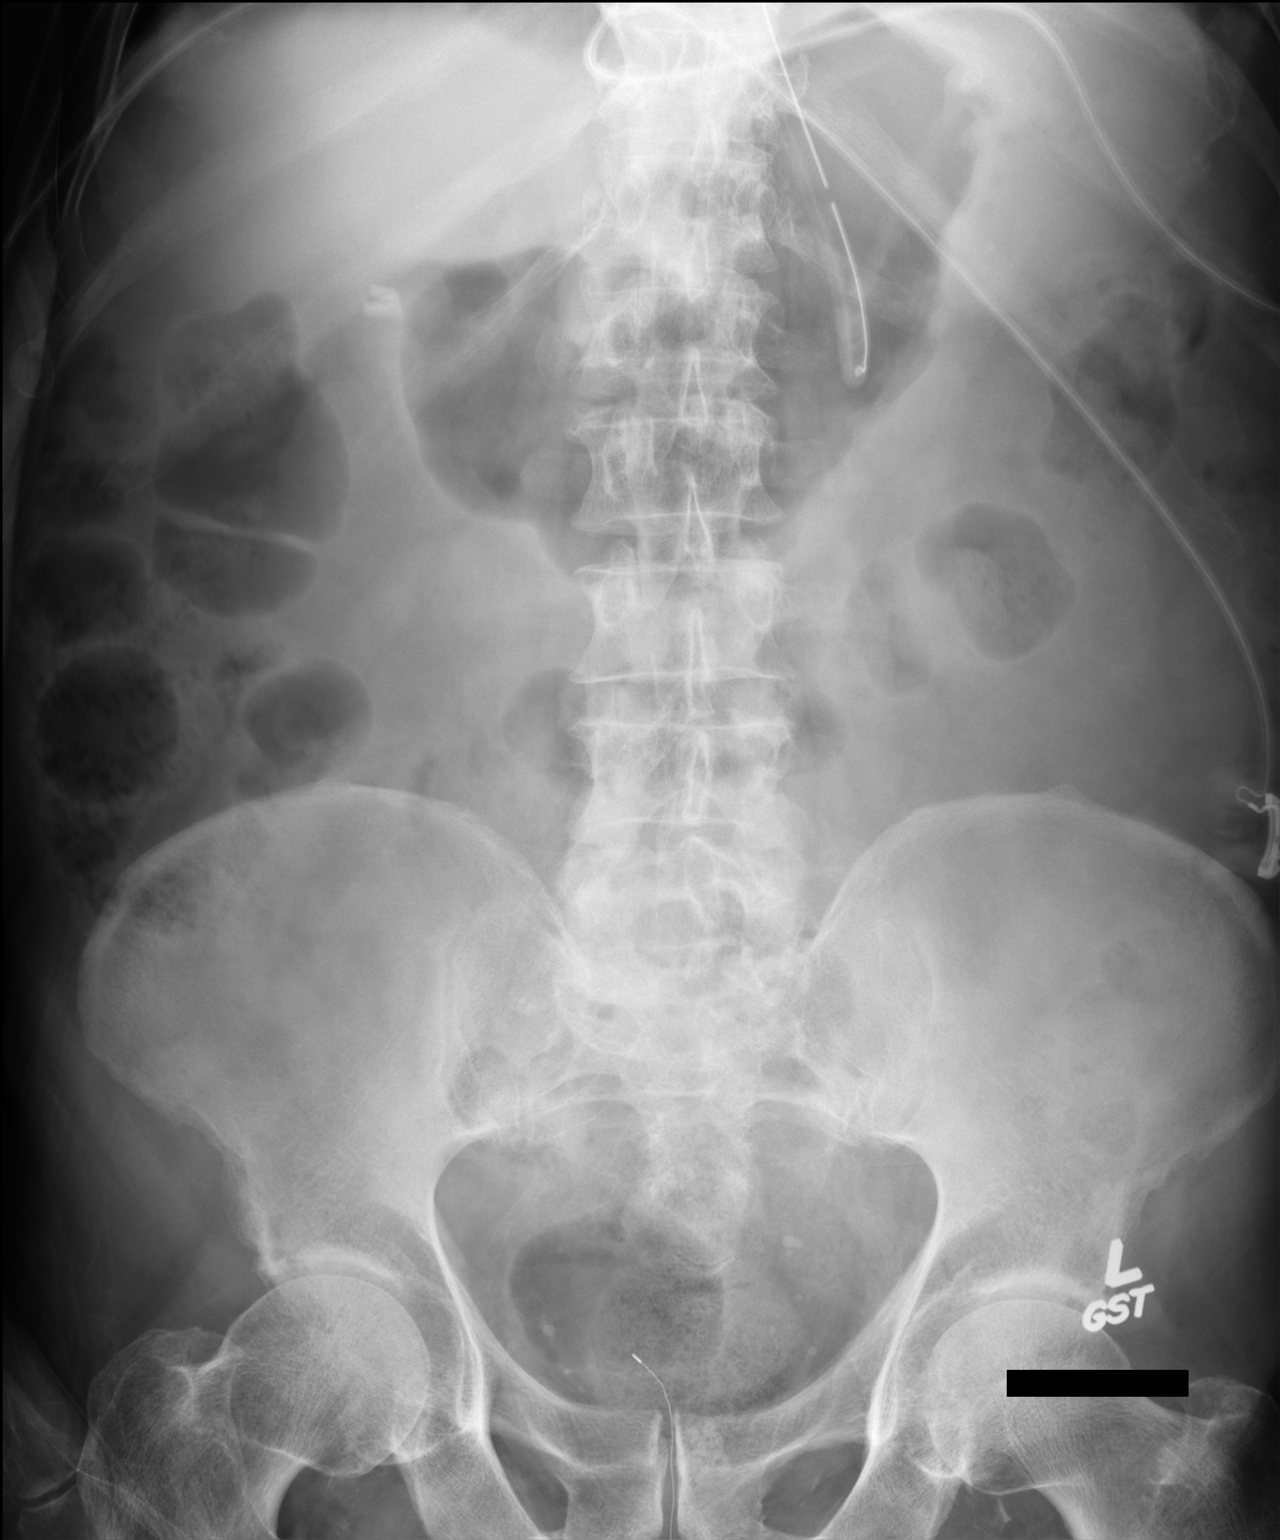

[1 of 1 positions shown; findings below may reference images not displayed]

FINDINGS: There is a nasogastric tube with tip over the gastric body. A pelvic
thermistor is also noted.

Bowel gas pattern is nonobstructive.

A right iliac artery stent is noted.
IMPRESSION: Nasogastric tube tip over the gastric body.
